# Patient Record
Sex: Female | Born: 1985 | Race: Black or African American | Hispanic: No | Smoking: Current every day smoker
Health system: Southern US, Community
[De-identification: ages and names within clinical notes are randomized; demographics above are authoritative.]

## PROBLEM LIST (undated history)

## (undated) ENCOUNTER — Inpatient Hospital Stay (HOSPITAL_COMMUNITY): Payer: Self-pay

## (undated) DIAGNOSIS — U071 COVID-19: Secondary | ICD-10-CM

## (undated) DIAGNOSIS — IMO0002 Reserved for concepts with insufficient information to code with codable children: Secondary | ICD-10-CM

## (undated) DIAGNOSIS — F419 Anxiety disorder, unspecified: Secondary | ICD-10-CM

## (undated) DIAGNOSIS — B977 Papillomavirus as the cause of diseases classified elsewhere: Secondary | ICD-10-CM

## (undated) DIAGNOSIS — R87629 Unspecified abnormal cytological findings in specimens from vagina: Secondary | ICD-10-CM

## (undated) DIAGNOSIS — A599 Trichomoniasis, unspecified: Secondary | ICD-10-CM

## (undated) DIAGNOSIS — R87619 Unspecified abnormal cytological findings in specimens from cervix uteri: Secondary | ICD-10-CM

## (undated) DIAGNOSIS — R51 Headache: Secondary | ICD-10-CM

## (undated) DIAGNOSIS — D649 Anemia, unspecified: Secondary | ICD-10-CM

## (undated) DIAGNOSIS — A749 Chlamydial infection, unspecified: Secondary | ICD-10-CM

## (undated) DIAGNOSIS — G971 Other reaction to spinal and lumbar puncture: Secondary | ICD-10-CM

## (undated) DIAGNOSIS — N390 Urinary tract infection, site not specified: Secondary | ICD-10-CM

## (undated) DIAGNOSIS — K219 Gastro-esophageal reflux disease without esophagitis: Secondary | ICD-10-CM

## (undated) HISTORY — PX: GYNECOLOGIC CRYOSURGERY: SHX857

## (undated) HISTORY — PX: DILATION AND CURETTAGE OF UTERUS: SHX78

## (undated) HISTORY — PX: PILONIDAL CYST EXCISION: SHX744

## (undated) HISTORY — PX: THERAPEUTIC ABORTION: SHX798

---
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
## 2005-02-08 ENCOUNTER — Inpatient Hospital Stay (HOSPITAL_COMMUNITY): Admission: AD | Admit: 2005-02-08 | Discharge: 2005-02-08 | Payer: Self-pay | Admitting: Obstetrics and Gynecology

## 2005-03-06 ENCOUNTER — Inpatient Hospital Stay (HOSPITAL_COMMUNITY): Admission: AD | Admit: 2005-03-06 | Discharge: 2005-03-06 | Payer: Self-pay | Admitting: *Deleted

## 2005-04-12 ENCOUNTER — Ambulatory Visit (HOSPITAL_COMMUNITY): Admission: RE | Admit: 2005-04-12 | Discharge: 2005-04-12 | Payer: Self-pay | Admitting: Obstetrics & Gynecology

## 2005-05-07 ENCOUNTER — Ambulatory Visit: Payer: Self-pay | Admitting: Gastroenterology

## 2005-07-08 ENCOUNTER — Emergency Department (HOSPITAL_COMMUNITY): Admission: EM | Admit: 2005-07-08 | Discharge: 2005-07-08 | Payer: Self-pay | Admitting: Emergency Medicine

## 2005-10-25 ENCOUNTER — Other Ambulatory Visit: Admission: RE | Admit: 2005-10-25 | Discharge: 2005-10-25 | Payer: Self-pay | Admitting: Obstetrics and Gynecology

## 2005-10-30 ENCOUNTER — Inpatient Hospital Stay (HOSPITAL_COMMUNITY): Admission: AD | Admit: 2005-10-30 | Discharge: 2005-10-30 | Payer: Self-pay | Admitting: Obstetrics and Gynecology

## 2006-01-28 ENCOUNTER — Inpatient Hospital Stay (HOSPITAL_COMMUNITY): Admission: AD | Admit: 2006-01-28 | Discharge: 2006-01-28 | Payer: Self-pay | Admitting: Obstetrics and Gynecology

## 2006-04-14 ENCOUNTER — Inpatient Hospital Stay (HOSPITAL_COMMUNITY): Admission: AD | Admit: 2006-04-14 | Discharge: 2006-04-14 | Payer: Self-pay | Admitting: Obstetrics and Gynecology

## 2006-05-02 ENCOUNTER — Inpatient Hospital Stay (HOSPITAL_COMMUNITY): Admission: AD | Admit: 2006-05-02 | Discharge: 2006-05-04 | Payer: Self-pay | Admitting: Obstetrics and Gynecology

## 2006-11-06 ENCOUNTER — Emergency Department (HOSPITAL_COMMUNITY): Admission: EM | Admit: 2006-11-06 | Discharge: 2006-11-06 | Payer: Self-pay | Admitting: Emergency Medicine

## 2007-03-30 ENCOUNTER — Emergency Department (HOSPITAL_COMMUNITY): Admission: EM | Admit: 2007-03-30 | Discharge: 2007-03-30 | Payer: Self-pay | Admitting: Emergency Medicine

## 2007-04-22 ENCOUNTER — Ambulatory Visit: Payer: Self-pay | Admitting: Gastroenterology

## 2007-04-22 LAB — CONVERTED CEMR LAB
ALT: 16 units/L (ref 0–35)
AST: 16 units/L (ref 0–37)
Albumin: 4 g/dL (ref 3.5–5.2)
Alkaline Phosphatase: 45 units/L (ref 39–117)
BUN: 7 mg/dL (ref 6–23)
Basophils Absolute: 0 10*3/uL (ref 0.0–0.1)
Chloride: 105 meq/L (ref 96–112)
Creatinine, Ser: 0.5 mg/dL (ref 0.4–1.2)
HCT: 34.1 % — ABNORMAL LOW (ref 36.0–46.0)
MCHC: 34.1 g/dL (ref 30.0–36.0)
Monocytes Relative: 7.2 % (ref 3.0–11.0)
RBC: 3.85 M/uL — ABNORMAL LOW (ref 3.87–5.11)
RDW: 11.8 % (ref 11.5–14.6)
Total Bilirubin: 0.5 mg/dL (ref 0.3–1.2)

## 2007-04-28 ENCOUNTER — Ambulatory Visit: Payer: Self-pay | Admitting: Gastroenterology

## 2007-07-01 ENCOUNTER — Inpatient Hospital Stay (HOSPITAL_COMMUNITY): Admission: AD | Admit: 2007-07-01 | Discharge: 2007-07-02 | Payer: Self-pay | Admitting: Gynecology

## 2007-07-09 DIAGNOSIS — R1013 Epigastric pain: Secondary | ICD-10-CM | POA: Insufficient documentation

## 2007-07-09 DIAGNOSIS — R109 Unspecified abdominal pain: Secondary | ICD-10-CM | POA: Insufficient documentation

## 2007-07-09 DIAGNOSIS — L0591 Pilonidal cyst without abscess: Secondary | ICD-10-CM | POA: Insufficient documentation

## 2007-07-09 DIAGNOSIS — K219 Gastro-esophageal reflux disease without esophagitis: Secondary | ICD-10-CM

## 2008-01-09 ENCOUNTER — Inpatient Hospital Stay (HOSPITAL_COMMUNITY): Admission: AD | Admit: 2008-01-09 | Discharge: 2008-01-09 | Payer: Self-pay | Admitting: Obstetrics

## 2008-02-02 ENCOUNTER — Ambulatory Visit: Payer: Self-pay | Admitting: Gastroenterology

## 2008-03-05 ENCOUNTER — Emergency Department (HOSPITAL_COMMUNITY): Admission: EM | Admit: 2008-03-05 | Discharge: 2008-03-05 | Payer: Self-pay | Admitting: Emergency Medicine

## 2008-09-23 ENCOUNTER — Emergency Department (HOSPITAL_COMMUNITY): Admission: EM | Admit: 2008-09-23 | Discharge: 2008-09-23 | Payer: Self-pay | Admitting: Emergency Medicine

## 2008-11-28 ENCOUNTER — Emergency Department (HOSPITAL_COMMUNITY): Admission: EM | Admit: 2008-11-28 | Discharge: 2008-11-28 | Payer: Self-pay | Admitting: Emergency Medicine

## 2009-01-19 ENCOUNTER — Ambulatory Visit (HOSPITAL_COMMUNITY): Admission: RE | Admit: 2009-01-19 | Discharge: 2009-01-19 | Payer: Self-pay | Admitting: Obstetrics

## 2009-01-28 ENCOUNTER — Inpatient Hospital Stay (HOSPITAL_COMMUNITY): Admission: AD | Admit: 2009-01-28 | Discharge: 2009-01-29 | Payer: Self-pay | Admitting: Obstetrics

## 2009-07-26 ENCOUNTER — Emergency Department (HOSPITAL_BASED_OUTPATIENT_CLINIC_OR_DEPARTMENT_OTHER): Admission: EM | Admit: 2009-07-26 | Discharge: 2009-07-26 | Payer: Self-pay | Admitting: Emergency Medicine

## 2009-09-19 ENCOUNTER — Emergency Department (HOSPITAL_BASED_OUTPATIENT_CLINIC_OR_DEPARTMENT_OTHER): Admission: EM | Admit: 2009-09-19 | Discharge: 2009-09-19 | Payer: Self-pay | Admitting: Emergency Medicine

## 2009-09-23 ENCOUNTER — Emergency Department (HOSPITAL_BASED_OUTPATIENT_CLINIC_OR_DEPARTMENT_OTHER): Admission: EM | Admit: 2009-09-23 | Discharge: 2009-09-23 | Payer: Self-pay | Admitting: Emergency Medicine

## 2010-01-10 ENCOUNTER — Emergency Department (HOSPITAL_BASED_OUTPATIENT_CLINIC_OR_DEPARTMENT_OTHER): Admission: EM | Admit: 2010-01-10 | Discharge: 2010-01-10 | Payer: Self-pay | Admitting: Emergency Medicine

## 2010-01-19 ENCOUNTER — Emergency Department (HOSPITAL_BASED_OUTPATIENT_CLINIC_OR_DEPARTMENT_OTHER): Admission: EM | Admit: 2010-01-19 | Discharge: 2010-01-19 | Payer: Self-pay | Admitting: Emergency Medicine

## 2010-01-25 ENCOUNTER — Emergency Department (HOSPITAL_BASED_OUTPATIENT_CLINIC_OR_DEPARTMENT_OTHER): Admission: EM | Admit: 2010-01-25 | Discharge: 2010-01-25 | Payer: Self-pay | Admitting: Emergency Medicine

## 2010-02-08 ENCOUNTER — Emergency Department (HOSPITAL_COMMUNITY): Admission: EM | Admit: 2010-02-08 | Discharge: 2010-02-08 | Payer: Self-pay | Admitting: Family Medicine

## 2010-05-31 ENCOUNTER — Emergency Department (HOSPITAL_BASED_OUTPATIENT_CLINIC_OR_DEPARTMENT_OTHER)
Admission: EM | Admit: 2010-05-31 | Discharge: 2010-05-31 | Payer: Self-pay | Source: Home / Self Care | Admitting: Emergency Medicine

## 2010-08-08 ENCOUNTER — Inpatient Hospital Stay (INDEPENDENT_AMBULATORY_CARE_PROVIDER_SITE_OTHER)
Admission: RE | Admit: 2010-08-08 | Discharge: 2010-08-08 | Disposition: A | Discharge status: Home / Self Care | Payer: Self-pay | Source: Ambulatory Visit | Attending: Family Medicine | Admitting: Family Medicine

## 2010-08-08 DIAGNOSIS — N760 Acute vaginitis: Secondary | ICD-10-CM

## 2010-08-08 LAB — WET PREP, GENITAL

## 2010-08-09 LAB — GC/CHLAMYDIA PROBE AMP, GENITAL: GC Probe Amp, Genital: NEGATIVE

## 2010-08-14 LAB — WET PREP, GENITAL: Yeast Wet Prep HPF POC: NONE SEEN

## 2010-08-14 LAB — URINALYSIS, ROUTINE W REFLEX MICROSCOPIC
Glucose, UA: NEGATIVE mg/dL
Ketones, ur: NEGATIVE mg/dL
Leukocytes, UA: NEGATIVE
Nitrite: NEGATIVE
Protein, ur: NEGATIVE mg/dL

## 2010-08-14 LAB — GC/CHLAMYDIA PROBE AMP, GENITAL: GC Probe Amp, Genital: NEGATIVE

## 2010-08-14 LAB — PREGNANCY, URINE: Preg Test, Ur: NEGATIVE

## 2010-08-17 LAB — POCT URINALYSIS DIPSTICK
Bilirubin Urine: NEGATIVE
Nitrite: NEGATIVE
Protein, ur: NEGATIVE mg/dL
Urobilinogen, UA: 0.2 mg/dL (ref 0.0–1.0)
pH: 5 (ref 5.0–8.0)

## 2010-08-17 LAB — URINE CULTURE: Culture  Setup Time: 201109072310

## 2010-08-17 LAB — WET PREP, GENITAL: Trich, Wet Prep: NONE SEEN

## 2010-08-17 LAB — GC/CHLAMYDIA PROBE AMP, GENITAL: Chlamydia, DNA Probe: NEGATIVE

## 2010-08-18 LAB — URINALYSIS, ROUTINE W REFLEX MICROSCOPIC
Bilirubin Urine: NEGATIVE
Bilirubin Urine: NEGATIVE
Glucose, UA: NEGATIVE mg/dL
Glucose, UA: NEGATIVE mg/dL
Glucose, UA: NEGATIVE mg/dL
Ketones, ur: NEGATIVE mg/dL
Ketones, ur: NEGATIVE mg/dL
Protein, ur: NEGATIVE mg/dL
Specific Gravity, Urine: 1.023 (ref 1.005–1.030)
Urobilinogen, UA: 0.2 mg/dL (ref 0.0–1.0)
pH: 5.5 (ref 5.0–8.0)
pH: 6.5 (ref 5.0–8.0)

## 2010-08-18 LAB — WET PREP, GENITAL
Clue Cells Wet Prep HPF POC: NONE SEEN
Trich, Wet Prep: NONE SEEN
WBC, Wet Prep HPF POC: NONE SEEN
Yeast Wet Prep HPF POC: NONE SEEN

## 2010-08-18 LAB — URINE MICROSCOPIC-ADD ON

## 2010-08-22 LAB — URINALYSIS, ROUTINE W REFLEX MICROSCOPIC
Nitrite: NEGATIVE
Specific Gravity, Urine: 1.023 (ref 1.005–1.030)
pH: 8 (ref 5.0–8.0)

## 2010-08-22 LAB — PREGNANCY, URINE: Preg Test, Ur: NEGATIVE

## 2010-08-22 LAB — WET PREP, GENITAL

## 2010-08-25 LAB — WET PREP, GENITAL
Trich, Wet Prep: NONE SEEN
Yeast Wet Prep HPF POC: NONE SEEN

## 2010-08-25 LAB — URINE MICROSCOPIC-ADD ON

## 2010-08-25 LAB — GLUCOSE, CAPILLARY: Glucose-Capillary: 118 mg/dL — ABNORMAL HIGH (ref 70–99)

## 2010-08-25 LAB — PREGNANCY, URINE: Preg Test, Ur: NEGATIVE

## 2010-08-25 LAB — URINALYSIS, ROUTINE W REFLEX MICROSCOPIC
Nitrite: NEGATIVE
Specific Gravity, Urine: 1.025 (ref 1.005–1.030)
Urobilinogen, UA: 0.2 mg/dL (ref 0.0–1.0)

## 2010-08-25 LAB — GC/CHLAMYDIA PROBE AMP, GENITAL: GC Probe Amp, Genital: NEGATIVE

## 2010-09-09 LAB — URINALYSIS, ROUTINE W REFLEX MICROSCOPIC
Bilirubin Urine: NEGATIVE
Glucose, UA: NEGATIVE mg/dL
Specific Gravity, Urine: 1.015 (ref 1.005–1.030)
Urobilinogen, UA: 0.2 mg/dL (ref 0.0–1.0)

## 2010-09-09 LAB — URINE MICROSCOPIC-ADD ON

## 2010-10-02 ENCOUNTER — Emergency Department (HOSPITAL_BASED_OUTPATIENT_CLINIC_OR_DEPARTMENT_OTHER)
Admission: EM | Admit: 2010-10-02 | Discharge: 2010-10-02 | Disposition: A | Discharge status: Home / Self Care | Payer: Self-pay | Attending: Emergency Medicine | Admitting: Emergency Medicine

## 2010-10-02 DIAGNOSIS — N760 Acute vaginitis: Secondary | ICD-10-CM | POA: Insufficient documentation

## 2010-10-02 DIAGNOSIS — A499 Bacterial infection, unspecified: Secondary | ICD-10-CM | POA: Insufficient documentation

## 2010-10-02 DIAGNOSIS — B9689 Other specified bacterial agents as the cause of diseases classified elsewhere: Secondary | ICD-10-CM | POA: Insufficient documentation

## 2010-10-02 LAB — WET PREP, GENITAL: Yeast Wet Prep HPF POC: NONE SEEN

## 2010-10-02 LAB — PREGNANCY, URINE: Preg Test, Ur: NEGATIVE

## 2010-10-17 NOTE — Assessment & Plan Note (Signed)
[facility]                         GASTROENTEROLOGY OFFICE NOTE   Ashley Cole, Ashley Cole                      MRN:          [id number]  DATE:04/22/2007                            DOB:          [birth year]    PRIMARY CARE PHYSICIAN:  Dr. Bing Neighbors. Clearance Coots.   GI PROBLEM LIST:  1. Likely gastroesophageal reflux disease, related to dyspepsia and      nausea, which is improved with PPI two years ago. Pyrosis, chest      pains.   INTERVAL HISTORY:  I last saw Ashley Cole two years ago.  She did not show up  for follow-up appointments, to re-evaluate her nausea and her  gastroesophageal reflux disease-like symptoms.  Since then, she gave  birth to her son, who will be turning one this month.  She has  intermittent pyrosis, chest pains, nausea and vomiting.  She used to  drink a lot of caffeine, but has cut back on this.  She noticed that  alcohol intake will worsen these symptoms.  She drinks probably twice a  week quite high quantities for her size when she does drink.  She smokes  cigarettes as well.  She has been to the emergency room in the past  month or two, when the burning in her chest got significant.  She was  told that this was gastroesophageal reflux disease-related and was given  what sounds like Prilosec.  She took that for three or four days and  noticed some improvement, but has stopped.   REVIEW OF SYSTEMS:  Notable for a stable weight.   CURRENT MEDICATIONS:  Loestrin.   PHYSICAL EXAMINATION:  VITAL SIGNS:  Weight 113 pounds, which is down 4  pounds in two years, blood pressure 110/54, pulse 72.  CONSTITUTIONAL:  Generally well-appearing.  LUNGS:  Clear to auscultation bilaterally.  HEART:  A regular rate and rhythm.  ABDOMEN:  Soft, nontender, non-distended.  Normal bowel sounds.   ASSESSMENT/PLAN:  A 25 year old woman with probable gastroesophageal  reflux disease-related nausea and vomiting.   I will get a basic set of labs today,  including a CBC and a complete  metabolic profile.  I have given her samples for a proton pump inhibitor  and instructing her to take it 20-30 minutes prior to her breakfast meal  on a daily basis.  I am giving her a gastroesophageal reflux disease  handout and have explicitly states that caffeine intake, smoking  cigarettes and alcohol intake  can create worse gastroesophageal reflux disease problems.  I think we  should proceed with an EGD at her soonest convenience.     Rachael Fee, MD  Electronically Signed    DPJ/MedQ  DD: 04/22/2007  DT: 04/22/2007  Job #: [id number]   cc:   Leonette Most A. Clearance Coots, M.D.

## 2010-10-20 NOTE — Discharge Summary (Signed)
NAMEJANARIA, Ashley Cole               ACCOUNT NO.:  [id number]   MEDICAL RECORD NO.:  [id number]          PATIENT TYPE:  INP   LOCATION:  9108                          FACILITY:  [facility]   PHYSICIAN:  Zenaida Niece, M.D.DATE OF BIRTH:  01-15-1986   DATE OF ADMISSION:  05/02/2006  DATE OF DISCHARGE:  05/04/2006                               DISCHARGE SUMMARY   ADMISSION DIAGNOSIS:  Intrauterine pregnancy at 38 weeks.   DISCHARGE DIAGNOSES:  1. Intrauterine pregnancy at 38 weeks.  2. Spinal headache.   PROCEDURES:  On May 02, 2006, she had a spontaneous vaginal  delivery, and on December 1, she had a lumbar epidural blood patch.   HISTORY AND PHYSICAL:  This is a 25 year old black female, gravida 6,  para 1-0-4-1, with an EGA of 38+ weeks who is admitted by Dr. Ambrose Mantle for  elective induction.  Prenatal care complicated by a UTI treated with  Macrobid and colposcopy consistent with a low-grade SIL Pap smear.   PRENATAL LABORATORY DATA:  Blood type is A+ with negative antibody  screens. Hemoglobin electrophoresis is normal, RPR nonreactive, rubella  immune, hepatitis B surface antigen negative, HIV negative, gonorrhea  and chlamydia negative. First trimester screen normal.  MSAFP is normal,  and group B strep is negative.   PAST OB HISTORY:  Four elective terminations.   PAST SURGICAL HISTORY:  Pilonidal cyst removal.   PAST MEDICAL HISTORY:  Gastroesophageal reflux disease and attention  deficit disorder.   PHYSICAL EXAMINATION:  VITAL SIGNS:  She is afebrile with stable vital  signs.  ABDOMEN: Soft with fundal height of 36.5 cm.  Fetal heart tracing is  normal.  PELVIC:  Cervix is 2, 60, -2, and vertex.   HOSPITAL COURSE:  The patient was admitted and started on Pitocin.  On  the afternoon of November 29, Dr. Ambrose Mantle was able to perform amniotomy.  She then progressed to complete, pushed well, and on the evening of  November 29 had a vaginal delivery of a viable female  infant with Apgars  of 8 and 9, weight 6 pounds.  Placenta was intact.  Uterus was normal,  and she had no perineal lacerations.  She had bilateral labial  lacerations which were hemostatic and not repaired.  Estimated blood  loss was 500 mL.   Postpartum course was complicated only by a post epidural headache for  which she eventually received a blood patch by Dr. Malen Gauze, and this  greatly improved her headaches.  Pre delivery hemoglobin 10.9,  postdelivery 10.3.  On postpartum #2, she was felt to be stable enough  for discharge home.   DISCHARGE INSTRUCTIONS:  Regular diet, pelvic rest,  follow up in 6  weeks.   DISCHARGE MEDICATIONS:  1. Over-the-counter ibuprofen as needed.  2. Percocet, #20, one to two p.o. q. 4-6 h p.r.n. pain.   She is given our discharge pamphlet.      Zenaida Niece, M.D.  Electronically Signed     TDM/MEDQ  D:  05/04/2006  T:  05/06/2006  Job:  [id number]

## 2010-10-20 NOTE — Discharge Summary (Signed)
[facility]  Patient:    Ashley Cole, Ashley Cole                      MRN: [id number] Adm. Date:  [id number] Disc. Date: [id number] Attending:  Michaele Offer                           Discharge Summary  HISTORY:                      This is a 25 year old black female para 0-0-1-0, gravida 2, last period March 28, 2000, [facility] January 02, 2001.  Initial ultrasound on July 15, 2000 showed her to be 15 weeks and 4 days with an [place] of January 02, 2001.  The patient felt that she had been leaking fluid for two to three days.  Her contractions were irregular.  She had no vaginal bleeding.  She had good fetal movement.  Examination in the office by Dr. Jackelyn Knife showed a positive Nitrazine.  AFI was 4.4 which had gone down from 9+ within a week.  The patients prenatal course was complicated by anemia treated with iron.  She was taking Chromagen.  PAST OBSTETRICAL HISTORY:     She had an early abortion in 2001.  ALLERGIES:                    No known drug allergies.  PAST MEDICAL HISTORY:         History of anemia, history of ADHD, pilonidal cyst.  FAMILY HISTORY:               She has a brother with CP.  Father of the baby has a hole in his heart.  SOCIAL HISTORY:               Patient is single.  No tobacco.  PRENATAL LABORATORIES:        Blood group and type A+ with a negative antibody screen.  Rubella immune.  Hepatitis B surface antigen negative.  HIV negative. Serology nonreactive.  GC and chlamydia negative.  Group B strep negative. One hour glucola 80.                                On admission the patient was placed on Pitocin. By 1:40 a.m. on November 25, 2000 the cervix was 2 cm, 80%, vertex at a -1 and artificial rupture of the membranes of the possible four bag produced clear fluid.  The patient progressed to full dilatation and with a silver dollar of caput showing began having significant decelerations that persisted in spite of lateral  uterine displacement and oxygen.  Mityvac vacuum was placed and the vertex brought out ROA by Dr. Ambrose Mantle with one contraction.  Infant was female, 6 pounds 7 ounces, Apgars 9 at one and 9 at five minutes.  Placenta intact. Bilateral labial lacerations hemostatic and not repaired.  Blood loss about 300 cc.  Postpartum the patient did well.  She was seen by the social work team.  I filled out forms for Homebound and she was ready for discharge on the second postpartum day.  LABORATORIES:                 White count on admission was 8000, hemoglobin 11.3, hematocrit 32.3, platelet count 252,000.  Followup white count 10,100, hemoglobin 10.6,  hematocrit 30.6, platelet count 205,000.  RPR was nonreactive.  FINAL DIAGNOSES:              Intrauterine pregnancy at 38 weeks delivered right occiput anterior, premature rupture of membranes.  OPERATION:                    Vacuum assisted delivery right occiput anterior.  FINAL CONDITION:              Improved.  DISCHARGE INSTRUCTIONS:       Regular discharge instruction booklet.  Patient is advised to return to the office in six weeks for followup examination.  She declines analgesics at discharge.  She is advised to call with any problems. DD:  12/27/00 TD:  12/27/00 Job: [id number]

## 2010-10-29 ENCOUNTER — Emergency Department (HOSPITAL_COMMUNITY): Payer: Self-pay

## 2010-10-29 ENCOUNTER — Emergency Department (HOSPITAL_COMMUNITY)
Admission: EM | Admit: 2010-10-29 | Discharge: 2010-10-29 | Disposition: A | Discharge status: Home / Self Care | Payer: Self-pay | Attending: Emergency Medicine | Admitting: Emergency Medicine

## 2010-10-29 DIAGNOSIS — S91009A Unspecified open wound, unspecified ankle, initial encounter: Secondary | ICD-10-CM | POA: Insufficient documentation

## 2010-10-29 DIAGNOSIS — M79609 Pain in unspecified limb: Secondary | ICD-10-CM | POA: Insufficient documentation

## 2010-10-29 DIAGNOSIS — S81009A Unspecified open wound, unspecified knee, initial encounter: Secondary | ICD-10-CM | POA: Insufficient documentation

## 2011-01-03 ENCOUNTER — Inpatient Hospital Stay (HOSPITAL_COMMUNITY)
Admission: AD | Admit: 2011-01-03 | Discharge: 2011-01-03 | Disposition: A | Discharge status: Home / Self Care | Payer: Self-pay | Source: Ambulatory Visit | Attending: Obstetrics & Gynecology | Admitting: Obstetrics & Gynecology

## 2011-01-03 ENCOUNTER — Encounter (HOSPITAL_COMMUNITY): Payer: Self-pay

## 2011-01-03 ENCOUNTER — Inpatient Hospital Stay (HOSPITAL_COMMUNITY): Payer: Self-pay

## 2011-01-03 DIAGNOSIS — R58 Hemorrhage, not elsewhere classified: Secondary | ICD-10-CM

## 2011-01-03 DIAGNOSIS — O209 Hemorrhage in early pregnancy, unspecified: Secondary | ICD-10-CM | POA: Insufficient documentation

## 2011-01-03 HISTORY — DX: Anemia, unspecified: D64.9

## 2011-01-03 LAB — URINALYSIS, ROUTINE W REFLEX MICROSCOPIC
Glucose, UA: NEGATIVE mg/dL
Leukocytes, UA: NEGATIVE
Protein, ur: NEGATIVE mg/dL
Specific Gravity, Urine: >1.03 — ABNORMAL HIGH (ref 1.005–1.030)
pH: 6 (ref 5.0–8.0)

## 2011-01-03 LAB — ABO/RH: ABO/RH(D): A POS

## 2011-01-03 LAB — URINE MICROSCOPIC-ADD ON

## 2011-01-03 LAB — CBC
MCHC: 32.9 g/dL (ref 30.0–36.0)
RDW: 12.6 % (ref 11.5–15.5)

## 2011-01-03 LAB — WET PREP, GENITAL: Trich, Wet Prep: NONE SEEN

## 2011-01-03 LAB — HCG, QUANTITATIVE, PREGNANCY: hCG, Beta Chain, Quant, S: 28812 m[IU]/mL — ABNORMAL HIGH (ref <5)

## 2011-01-03 LAB — POCT PREGNANCY, URINE: Preg Test, Ur: POSITIVE

## 2011-01-03 MED ORDER — ONDANSETRON 8 MG PO TBDP: 8.0000 mg | ORAL_TABLET | Freq: Three times a day (TID) | ORAL | Status: AC | PRN

## 2011-01-03 MED ORDER — ONDANSETRON 8 MG PO TBDP
8.0000 mg | ORAL_TABLET | Freq: Once | ORAL | Status: AC
  Administered 2011-01-03: 8 mg via ORAL

## 2011-01-03 NOTE — Progress Notes (Signed)
Pt states awoke at 0800, was bleeding, no clots noted, took bath, bleeding has slowed down now. Mild cramping, feels nauseated. Very small smear on pad at present.

## 2011-01-03 NOTE — ED Provider Notes (Signed)
History     Chief Complaint  Patient presents with  . Vaginal Bleeding   HPI LMP 11-22-10  Positive pregnancy test today.  Has not started prenatal care.  Awakened with vaginal bleeding this morning.  Came for evaluation.  OB History    Grav Para Term Preterm Abortions TAB SAB Ect Mult Living   7 2 2  4 4    2       Past Medical History  Diagnosis Date  . Anemia     Past Surgical History  Procedure Date  . Cystectomy   . Dilation and curettage of uterus     No family history on file.  History  Substance Use Topics  . Smoking status: Current Everyday Smoker -- 0.0 packs/day  . Smokeless tobacco: Not on file  . Alcohol Use: No    Allergies: No Known Allergies  Prescriptions prior to admission  Medication Sig Dispense Refill  . calcium carbonate (TUMS - DOSED IN MG ELEMENTAL CALCIUM) 500 MG chewable tablet Chew 1 tablet by mouth daily as needed. For heartburn       . fluconazole (DIFLUCAN) 150 MG tablet Take 150 mg by mouth once.        Marland Kitchen OVER THE COUNTER MEDICATION 1 applicator as needed. For itching - patient used Monostat for yeast infection         Review of Systems  Gastrointestinal: Positive for nausea. Negative for vomiting and abdominal pain.  Genitourinary: Negative for dysuria.       Vaginal bleeding   Physical Exam   Blood pressure 113/41, pulse 72, temperature 98.4 F (36.9 C), resp. rate 16, height 4' 11.5" (1.511 m), weight 124 lb 9.6 oz (56.518 kg), last menstrual period 11/26/2010, SpO2 99.00%.  Physical Exam  Nursing note and vitals reviewed. Constitutional: She is oriented to person, place, and time. She appears well-developed and well-nourished.  HENT:  Head: Normocephalic.  Eyes: EOM are normal.  Neck: Neck supple.  GI: Soft. There is no tenderness. There is no rebound and no guarding.  Genitourinary:       Speculum exam:  Moderate amount of old blood in vagina Bimanual exam:  Cervix closed.  Uterus retroverted and difficult to size    Musculoskeletal: Normal range of motion.  Neurological: She is alert and oriented to person, place, and time.  Skin: Skin is warm and dry.  Psychiatric: She has a normal mood and affect.    MAU Course  Procedures Results for orders placed during the hospital encounter of 01/03/11 (from the past 24 hour(s))  URINALYSIS, ROUTINE W REFLEX MICROSCOPIC     Status: Abnormal   Collection Time   01/03/11  9:50 AM      Component Value Range   Color, Urine YELLOW  YELLOW    Appearance CLEAR  CLEAR    Specific Gravity, Urine >1.030 (*) 1.005 - 1.030    pH 6.0  5.0 - 8.0    Glucose, UA NEGATIVE  NEGATIVE (mg/dL)   Hgb urine dipstick LARGE (*) NEGATIVE    Bilirubin Urine NEGATIVE  NEGATIVE    Ketones, ur NEGATIVE  NEGATIVE (mg/dL)   Protein, ur NEGATIVE  NEGATIVE (mg/dL)   Urobilinogen, UA 0.2  0.0 - 1.0 (mg/dL)   Nitrite NEGATIVE  NEGATIVE    Leukocytes, UA NEGATIVE  NEGATIVE   URINE MICROSCOPIC-ADD ON     Status: Abnormal   Collection Time   01/03/11  9:50 AM      Component Value Range   Squamous  Epithelial / LPF FEW (*) RARE    RBC / HPF 3-6  <3 (RBC/hpf)   Urine-Other MUCOUS PRESENT    POCT PREGNANCY, URINE     Status: Normal   Collection Time   01/03/11  9:59 AM      Component Value Range   Preg Test, Ur POSITIVE    CBC     Status: Abnormal   Collection Time   01/03/11 10:40 AM      Component Value Range   WBC 4.6  4.0 - 10.5 (K/uL)   RBC 3.63 (*) 3.87 - 5.11 (MIL/uL)   Hemoglobin 10.6 (*) 12.0 - 15.0 (g/dL)   HCT 91.4 (*) 78.2 - 46.0 (%)   MCV 88.7  78.0 - 100.0 (fL)   MCH 29.2  26.0 - 34.0 (pg)   MCHC 32.9  30.0 - 36.0 (g/dL)   RDW 95.6  21.3 - 08.6 (%)   Platelets 239  150 - 400 (K/uL)  HCG, QUANTITATIVE, PREGNANCY     Status: Abnormal   Collection Time   01/03/11 10:40 AM      Component Value Range   hCG, Beta Chain, Quant, S 57846 (*) <5 (mIU/mL)  ABO/RH     Status: Normal   Collection Time   01/03/11 10:40 AM      Component Value Range   ABO/RH(D) A POS    WET PREP,  GENITAL     Status: Abnormal   Collection Time   01/03/11 10:40 AM      Component Value Range   Yeast, Wet Prep NONE SEEN  NONE SEEN    Trich, Wet Prep NONE SEEN  NONE SEEN    Clue Cells, Wet Prep FEW (*) NONE SEEN    WBC, Wet Prep HPF POC FEW (*) NONE SEEN     MDM Ultrasound:  IUGS, flattened and irreg in shape with small subchorionic bleed noted.  Yolk sac seen.  No fetal pole.  Assessment and Plan  Bleeding in pregnancy Nausea  Plan: Will repeat ultrasound in one week on Aug 8 at 9 am since today's [place] does not show viable pregnancy. Will prescribe zofran for client.   Takyla Kuchera 01/03/2011, 10:42 AM   Nolene Bernheim, NP 01/03/11 1402

## 2011-01-03 NOTE — Progress Notes (Signed)
Pt states she did a HPT that was POS about one week ago. Has been nauseated, no vomiting. This am woke up with a lot of blood in panties this am. Not wearing a pad at this time and no bleeding noted on tissue. Given a pad.

## 2011-01-03 NOTE — ED Provider Notes (Signed)
Agree with above note.  Ashley Cole A 01/03/2011 2:32 PM

## 2011-01-10 ENCOUNTER — Inpatient Hospital Stay (HOSPITAL_COMMUNITY)
Admission: AD | Admit: 2011-01-10 | Discharge: 2011-01-10 | Disposition: A | Discharge status: Home / Self Care | Payer: Self-pay | Source: Ambulatory Visit | Attending: Obstetrics & Gynecology | Admitting: Obstetrics & Gynecology

## 2011-01-10 ENCOUNTER — Ambulatory Visit (HOSPITAL_COMMUNITY)
Admission: RE | Admit: 2011-01-10 | Discharge: 2011-01-10 | Disposition: A | Discharge status: Home / Self Care | Payer: Self-pay | Source: Ambulatory Visit | Attending: Obstetrics & Gynecology | Admitting: Obstetrics & Gynecology

## 2011-01-10 DIAGNOSIS — Z3689 Encounter for other specified antenatal screening: Secondary | ICD-10-CM | POA: Insufficient documentation

## 2011-01-10 DIAGNOSIS — O210 Mild hyperemesis gravidarum: Secondary | ICD-10-CM | POA: Insufficient documentation

## 2011-01-10 DIAGNOSIS — O209 Hemorrhage in early pregnancy, unspecified: Secondary | ICD-10-CM

## 2011-01-10 MED ORDER — PROMETHAZINE HCL 25 MG PO TABS: 25.0000 mg | ORAL_TABLET | Freq: Four times a day (QID) | ORAL | Status: DC | PRN

## 2011-01-10 NOTE — ED Provider Notes (Addendum)
History   Pt presents today for viability [place]. She states she is doing well and is no longer having any vag bleeding.   Chief Complaint  Patient presents with  . Morning Sickness   HPI  OB History    Grav Para Term Preterm Abortions TAB SAB Ect Mult Living   7 2 2  4 4    2       Past Medical History  Diagnosis Date  . Anemia     Past Surgical History  Procedure Date  . Cystectomy   . Dilation and curettage of uterus     No family history on file.  History  Substance Use Topics  . Smoking status: Current Everyday Smoker -- 0.0 packs/day  . Smokeless tobacco: Not on file  . Alcohol Use: No    Allergies: No Known Allergies  Prescriptions prior to admission  Medication Sig Dispense Refill  . calcium carbonate (TUMS - DOSED IN MG ELEMENTAL CALCIUM) 500 MG chewable tablet Chew 1 tablet by mouth daily as needed. For heartburn       . fluconazole (DIFLUCAN) 150 MG tablet Take 150 mg by mouth once.        . ondansetron (ZOFRAN ODT) 8 MG disintegrating tablet Take 1 tablet (8 mg total) by mouth every 8 (eight) hours as needed for nausea (place under your tongue for the tablet to dissolve).  20 tablet  0  . OVER THE COUNTER MEDICATION 1 applicator as needed. For itching - patient used Monostat for yeast infection         Review of Systems  Constitutional: Negative for fever.  Cardiovascular: Negative for chest pain.  Gastrointestinal: Positive for nausea. Negative for vomiting, abdominal pain, diarrhea and constipation.  Genitourinary: Negative for dysuria, urgency, frequency, hematuria and flank pain.  Neurological: Negative for dizziness and headaches.  Psychiatric/Behavioral: Negative for depression and suicidal ideas.   Physical Exam   Blood pressure 99/53, pulse 79, temperature 98.4 F (36.9 C), temperature source Oral, resp. rate 16, last menstrual period 11/26/2010.  Physical Exam  Constitutional: She is oriented to person, place, and time. She appears  well-developed and well-nourished. No distress.  HENT:  Head: Normocephalic and atraumatic.  GI: Soft. She exhibits no distension. There is no tenderness. There is no rebound and no guarding.  Neurological: She is alert and oriented to person, place, and time.  Skin: Skin is warm and dry. She is not diaphoretic.  Psychiatric: She has a normal mood and affect. Her behavior is normal. Judgment and thought content normal.    MAU Course  Procedures  US shows single IUP with Gest age of 29wks 0days, and EDD of 08/29/2011.  Assessment and Plan  Pregnancy: pt is to begin prenatal care. Will give Rx for phenergan. Discussed diet, activity, risks, and precautions.  Clinton Gallant. Jhene Westmoreland III, DrHSc, MPAS, PA-C  01/10/2011, 9:29 AM   Henrietta Hoover, PA 01/10/11 [phone]

## 2011-01-10 NOTE — Progress Notes (Signed)
Patient here for repeat US, having nausea, meds not working for nausea, sharp pains from time to time, none at present, bleeding stopped on Saturday

## 2011-01-28 ENCOUNTER — Encounter (HOSPITAL_BASED_OUTPATIENT_CLINIC_OR_DEPARTMENT_OTHER): Payer: Self-pay | Admitting: *Deleted

## 2011-01-28 ENCOUNTER — Emergency Department (HOSPITAL_BASED_OUTPATIENT_CLINIC_OR_DEPARTMENT_OTHER)
Admission: EM | Admit: 2011-01-28 | Discharge: 2011-01-28 | Disposition: A | Discharge status: Home / Self Care | Payer: Self-pay | Attending: Emergency Medicine | Admitting: Emergency Medicine

## 2011-01-28 DIAGNOSIS — H9209 Otalgia, unspecified ear: Secondary | ICD-10-CM | POA: Insufficient documentation

## 2011-01-28 DIAGNOSIS — H6690 Otitis media, unspecified, unspecified ear: Secondary | ICD-10-CM | POA: Insufficient documentation

## 2011-01-28 LAB — URINALYSIS, ROUTINE W REFLEX MICROSCOPIC
Glucose, UA: NEGATIVE mg/dL
Leukocytes, UA: NEGATIVE
Nitrite: NEGATIVE
Protein, ur: NEGATIVE mg/dL

## 2011-01-28 LAB — PREGNANCY, URINE: Preg Test, Ur: POSITIVE

## 2011-01-28 LAB — URINE MICROSCOPIC-ADD ON

## 2011-01-28 MED ORDER — HYDROCODONE-ACETAMINOPHEN 5-325 MG PO TABS: 2.0000 | ORAL_TABLET | ORAL | Status: AC | PRN

## 2011-01-28 MED ORDER — AMOXICILLIN 500 MG PO CAPS: 500.0000 mg | ORAL_CAPSULE | Freq: Three times a day (TID) | ORAL | Status: AC

## 2011-01-28 NOTE — ED Notes (Signed)
Family with pt at discharge.  Pt educated about prescriptions and following up with PCP.  Pt educated about following up with clinic regarding post abortion concerns. Pt verbalizes understanding of care plan.  No acute distress noted at this time.

## 2011-01-28 NOTE — ED Provider Notes (Signed)
History     CSN: [id number] Arrival date & time: 01/28/2011  3:36 PM  Chief Complaint  Patient presents with  . Otalgia   Patient is a 25 y.o. female presenting with ear pain. The history is provided by the patient. No language interpreter was used.  Otalgia This is a new problem. The current episode started more than 2 days ago. There is pain in the left ear. The problem occurs constantly. The problem has been gradually worsening. There has been no fever. The fever has been present for less than 1 day. The pain is at a severity of 5/10. The pain is moderate. Associated symptoms include sore throat. Pertinent negatives include no headaches, no hearing loss, no rhinorrhea, no abdominal pain, no diarrhea, no vomiting, no cough and no rash. Her past medical history does not include chronic ear infection or hearing loss.  Pt also had positive home pregnancy test.  Pt had an abortion 2 weeks ago.  She has had unprotected sex since.  Pt wants to know if test is positive from old pregnancy or if this is a new pregnancy.  No abdominal pain.  No vaginal bleeding  Past Medical History  Diagnosis Date  . Anemia     Past Surgical History  Procedure Date  . Cystectomy   . Dilation and curettage of uterus   . Therapeutic abortion     History reviewed. No pertinent family history.  History  Substance Use Topics  . Smoking status: Current Everyday Smoker -- 0.0 packs/day  . Smokeless tobacco: Not on file  . Alcohol Use: No    OB History    Grav Para Term Preterm Abortions TAB SAB Ect Mult Living   7 2 2  4 4    2       Review of Systems  HENT: Positive for ear pain and sore throat. Negative for hearing loss and rhinorrhea.   Respiratory: Negative for cough.   Gastrointestinal: Negative for vomiting, abdominal pain and diarrhea.  Skin: Negative for rash.  Neurological: Negative for headaches.  All other systems reviewed and are negative.    Physical Exam  BP 112/72  Pulse 92   Temp(Src) 98.6 F (37 C) (Oral)  Resp 18  Ht 5' (1.524 m)  Wt 125 lb (56.7 kg)  BMI 24.41 kg/m2  SpO2 100%  LMP 11/26/2010  Breastfeeding? Unknown  Physical Exam  Nursing note and vitals reviewed. Constitutional: She is oriented to person, place, and time. She appears well-developed and well-nourished.  HENT:  Head: Normocephalic and atraumatic.  Left Ear: Tympanic membrane is erythematous and bulging.  Eyes: Conjunctivae and EOM are normal. Pupils are equal, round, and reactive to light.  Neck: Normal range of motion. Neck supple.  Cardiovascular: Normal rate, regular rhythm and normal heart sounds.   Pulmonary/Chest: Effort normal.  Abdominal: Soft. There is no tenderness.  Musculoskeletal: Normal range of motion.  Neurological: She is alert and oriented to person, place, and time. She has normal reflexes.  Skin: Skin is warm.  Psychiatric: She has a normal mood and affect.    ED Course  Procedures  MDM I advised pt she needs to recheck at abortion clinic.  She is advised to go to women's if complications.  Medical screening examination/treatment/procedure(s) were performed by non-physician practitioner and as supervising physician I was immediately available for consultation/collaboration. Osvaldo Human, M.D.    [place], [place] 01/28/11 1716  Carleene Cooper III, MD 01/29/11 [phone]

## 2011-01-28 NOTE — ED Notes (Signed)
Pt states she has had left ear pain, sore throat and gums swollen for about a week. Sharp pains in head. Also states that she had an abortion on the 9th and is concerned that she is still testing positive.

## 2011-01-28 NOTE — ED Notes (Signed)
Pt reports ear pain with drainage

## 2011-02-10 ENCOUNTER — Inpatient Hospital Stay (HOSPITAL_COMMUNITY)
Admission: AD | Admit: 2011-02-10 | Discharge: 2011-02-10 | Disposition: A | Discharge status: Home / Self Care | Payer: Self-pay | Source: Ambulatory Visit | Attending: Obstetrics & Gynecology | Admitting: Obstetrics & Gynecology

## 2011-02-10 ENCOUNTER — Inpatient Hospital Stay (HOSPITAL_COMMUNITY): Payer: Self-pay

## 2011-02-10 ENCOUNTER — Encounter (HOSPITAL_COMMUNITY): Payer: Self-pay | Admitting: Obstetrics and Gynecology

## 2011-02-10 DIAGNOSIS — N898 Other specified noninflammatory disorders of vagina: Secondary | ICD-10-CM | POA: Insufficient documentation

## 2011-02-10 DIAGNOSIS — N939 Abnormal uterine and vaginal bleeding, unspecified: Secondary | ICD-10-CM

## 2011-02-10 LAB — DIFFERENTIAL
Eosinophils Absolute: 0.2 10*3/uL (ref 0.0–0.7)
Eosinophils Relative: 3 % (ref 0–5)
Lymphocytes Relative: 37 % (ref 12–46)
Lymphs Abs: 1.8 10*3/uL (ref 0.7–4.0)
Monocytes Relative: 10 % (ref 3–12)
Neutrophils Relative %: 49 % (ref 43–77)

## 2011-02-10 LAB — CBC
Hemoglobin: 10.3 g/dL — ABNORMAL LOW (ref 12.0–15.0)
MCH: 29.6 pg (ref 26.0–34.0)
MCV: 89.9 fL (ref 78.0–100.0)
Platelets: 249 10*3/uL (ref 150–400)
RBC: 3.48 MIL/uL — ABNORMAL LOW (ref 3.87–5.11)
WBC: 4.8 10*3/uL (ref 4.0–10.5)

## 2011-02-10 LAB — HEMOGLOBIN AND HEMATOCRIT, BLOOD
HCT: 31.5 % — ABNORMAL LOW (ref 36.0–46.0)
Hemoglobin: 10.5 g/dL — ABNORMAL LOW (ref 12.0–15.0)

## 2011-02-10 LAB — WET PREP, GENITAL: Yeast Wet Prep HPF POC: NONE SEEN

## 2011-02-10 NOTE — Progress Notes (Signed)
Pt presents to MAU with complaints of ? Pregnancy and vaginal bleeding. Pt had an abortion on Aug 9th. At her 4 week checkup her pregnancy test was positive. Pt was at home and felt a gush of blood, with large clotts. Pt is a Barista

## 2011-02-10 NOTE — Progress Notes (Signed)
Patient reports big gush of blood at home has changed 3 pads in past hour, patient was pregnant had abortion at 7 weeks on August 9th, went back Thursday tested positive, patient unsure if new pregnancy.

## 2011-02-10 NOTE — ED Provider Notes (Signed)
History     CSN: [id number] Arrival date & time: 02/10/2011  2:15 PM  Chief Complaint  Patient presents with  . Vaginal Bleeding   HPI Ashley Cole is a 25 y.o. female who presents to MAU for follow up s/p TAB 01/11/11 at [facility]. Today had a gush of blood and camping. Used 3 pads in one hour. Had positive pregnancy test last week and was unsure if this was a new pregnancy or left from TAB.  Past Medical History  Diagnosis Date  . Anemia     Past Surgical History  Procedure Date  . Cystectomy   . Dilation and curettage of uterus   . Therapeutic abortion     No family history on file.  History  Substance Use Topics  . Smoking status: Current Everyday Smoker -- 0.0 packs/day  . Smokeless tobacco: Not on file  . Alcohol Use: No    OB History    Grav Para Term Preterm Abortions TAB SAB Ect Mult Living   7 2 2  4 4    2       Review of Systems  Gastrointestinal: Positive for abdominal pain.  Genitourinary: Positive for vaginal bleeding.  Musculoskeletal: Positive for back pain.  Skin: Positive for pallor.  Neurological: Positive for light-headedness.  All other systems reviewed and are negative.    Physical Exam  BP 108/62  Pulse 88  Temp(Src) 98.8 F (37.1 C) (Oral)  Resp 18  Ht 5' (1.524 m)  Wt 124 lb (56.246 kg)  BMI 24.22 kg/m2  LMP 11/26/2010  Breastfeeding? Unknown  Physical Exam  Nursing note and vitals reviewed. Constitutional: She is oriented to person, place, and time. She appears well-developed and well-nourished.  HENT:  Head: Normocephalic.  Eyes: EOM are normal.  Neck: Neck supple.  Pulmonary/Chest: Effort normal.  Abdominal: Soft. There is no tenderness.  Genitourinary:       Small amount of vaginal bleeding noted. Cervix closed. No CMT, no palpable enlargement of uterus.  Musculoskeletal: Normal range of motion.  Neurological: She is alert and oriented to person, place, and time. No cranial nerve deficit.  Skin: Skin is warm and  dry.    ED Course  Procedures  Study Result     *RADIOLOGY REPORT*   Clinical Data: Vaginal bleeding.  Status post abortion on 01/10/2011.   TRANSVAGINAL OB ULTRASOUND   Technique:  Transvaginal ultrasound was performed for evaluation of the gestation as well as the maternal uterus and adnexal regions.   Comparison: 01/10/2011.   Findings: Heterogeneous endometrium containing areas of decreased echogenicity and measuring 13.7 mm in maximum thickness. Otherwise, normal appearing uterus.  The ovaries have normal appearances.  No free peritoneal fluid is seen.   IMPRESSION: Thickened, heterogeneous endometrium containing hypoechoic areas. The hypoechoic areas could represent areas of hemorrhage or retained products of conception or both.   Original Report Authenticated By: Darrol Angel, M.D.     Results for orders placed during the hospital encounter of 02/10/11 (from the past 24 hour(s))  POCT PREGNANCY, URINE     Status: Normal   Collection Time   02/10/11  2:22 PM      Component Value Range   Preg Test, Ur POSITIVE    HCG, QUANTITATIVE, PREGNANCY     Status: Abnormal   Collection Time   02/10/11  3:26 PM      Component Value Range   hCG, Beta Chain, Quant, S 322 (*) <5 (mIU/mL)  ABO/RH  Status: Normal   Collection Time   02/10/11  3:26 PM      Component Value Range   ABO/RH(D) A POS    CBC     Status: Abnormal   Collection Time   02/10/11  3:26 PM      Component Value Range   WBC 4.8  4.0 - 10.5 (K/uL)   RBC 3.48 (*) 3.87 - 5.11 (MIL/uL)   Hemoglobin 10.3 (*) 12.0 - 15.0 (g/dL)   HCT 16.1 (*) 09.6 - 46.0 (%)   MCV 89.9  78.0 - 100.0 (fL)   MCH 29.6  26.0 - 34.0 (pg)   MCHC 32.9  30.0 - 36.0 (g/dL)   RDW 04.5  40.9 - 81.1 (%)   Platelets 249  150 - 400 (K/uL)  DIFFERENTIAL     Status: Normal   Collection Time   02/10/11  3:26 PM      Component Value Range   Neutrophils Relative 49  43 - 77 (%)   Neutro Abs 2.3  1.7 - 7.7 (K/uL)   Lymphocytes Relative 37  12  - 46 (%)   Lymphs Abs 1.8  0.7 - 4.0 (K/uL)   Monocytes Relative 10  3 - 12 (%)   Monocytes Absolute 0.5  0.1 - 1.0 (K/uL)   Eosinophils Relative 3  0 - 5 (%)   Eosinophils Absolute 0.2  0.0 - 0.7 (K/uL)   Basophils Relative 1  0 - 1 (%)   Basophils Absolute 0.1  0.0 - 0.1 (K/uL)    Assessment: Vaginal bleeding    Early pregnancy vs elevated Bhcg s/p TAB  Plan:  Discussed with Dr. Debroah Loop and since hgb. Is stable and bleeding is light to moderate will discharge patient and have her return in 2 days for follow up Bhcg.    Plan discussed with patient.      [place], NP 02/10/11 1800

## 2011-02-12 ENCOUNTER — Encounter (HOSPITAL_COMMUNITY): Payer: Self-pay | Admitting: *Deleted

## 2011-02-12 ENCOUNTER — Inpatient Hospital Stay (HOSPITAL_COMMUNITY)
Admission: AD | Admit: 2011-02-12 | Discharge: 2011-02-12 | Disposition: A | Discharge status: Home / Self Care | Payer: Self-pay | Source: Ambulatory Visit | Attending: Obstetrics and Gynecology | Admitting: Obstetrics and Gynecology

## 2011-02-12 DIAGNOSIS — O039 Complete or unspecified spontaneous abortion without complication: Secondary | ICD-10-CM | POA: Insufficient documentation

## 2011-02-12 HISTORY — DX: Headache: R51

## 2011-02-12 HISTORY — DX: Unspecified abnormal cytological findings in specimens from cervix uteri: R87.619

## 2011-02-12 HISTORY — DX: Papillomavirus as the cause of diseases classified elsewhere: B97.7

## 2011-02-12 HISTORY — DX: Reserved for concepts with insufficient information to code with codable children: IMO0002

## 2011-02-12 HISTORY — DX: Trichomoniasis, unspecified: A59.9

## 2011-02-12 HISTORY — DX: Urinary tract infection, site not specified: N39.0

## 2011-02-12 HISTORY — DX: Chlamydial infection, unspecified: A74.9

## 2011-02-12 LAB — GC/CHLAMYDIA PROBE AMP, GENITAL: Chlamydia, DNA Probe: NEGATIVE

## 2011-02-12 NOTE — ED Provider Notes (Signed)
History   Pt presents today for repeat B-quant. She has had vag bleeding in preg. She had an induced AB in August and was uncertain if she had retained products or if she had become pregnant once again. She states her bleeding has now slowed tremendously and she denies severe abd pain or any other sx.   HPI  OB History    Grav Para Term Preterm Abortions TAB SAB Ect Mult Living   7 2 2  4 4    2       Past Medical History  Diagnosis Date  . Anemia   . Headache   . Urinary tract infection   . Abnormal Pap smear   . Human papilloma virus     cells removed x2  . Chlamydia   . Trichomonas     Past Surgical History  Procedure Date  . Cystectomy   . Dilation and curettage of uterus   . Therapeutic abortion     No family history on file.  History  Substance Use Topics  . Smoking status: Current Everyday Smoker -- 0.2 packs/day for 10 years  . Smokeless tobacco: Not on file  . Alcohol Use: Yes    Allergies: No Known Allergies  Prescriptions prior to admission  Medication Sig Dispense Refill  . amoxicillin (AMOXIL) 500 MG capsule Take 500 mg by mouth 3 (three) times daily.        . calcium carbonate (TUMS - DOSED IN MG ELEMENTAL CALCIUM) 500 MG chewable tablet Chew 1-2 tablets by mouth daily as needed. For heartburn      . Chlorphen-Pseudoephed-APAP (THERAFLU COLD/SORE THROAT PO) Take 1 Package by mouth daily. For cold symptom relief       . diphenhydrAMINE (BENADRYL) 25 MG tablet Take 25 mg by mouth at bedtime as needed. For allergy symptom relief       . fluconazole (DIFLUCAN) 150 MG tablet Take 150 mg by mouth once.        Marland Kitchen HYDROcodone-acetaminophen (NORCO) 5-325 MG per tablet Take 1 tablet by mouth every 4 (four) hours as needed.          Review of Systems  Constitutional: Negative for fever.  Cardiovascular: Negative for chest pain.  Gastrointestinal: Positive for abdominal pain. Negative for nausea, vomiting, diarrhea, constipation and blood in stool.    Genitourinary: Negative for dysuria, urgency, frequency, hematuria and flank pain.  Neurological: Negative for dizziness and headaches.  Psychiatric/Behavioral: Negative for depression and suicidal ideas.   Physical Exam   Blood pressure 99/68, pulse 70, temperature 98.2 F (36.8 C), temperature source Oral, resp. rate 18, last menstrual period 11/26/2010, unknown if currently breastfeeding.  Physical Exam  Constitutional: She is oriented to person, place, and time. She appears well-developed and well-nourished. No distress.  HENT:  Head: Normocephalic and atraumatic.  Eyes: EOM are normal. Pupils are equal, round, and reactive to light.  GI: Soft. She exhibits no distension. There is no tenderness. There is no rebound and no guarding.  Neurological: She is alert and oriented to person, place, and time.  Skin: Skin is warm and dry. She is not diaphoretic.  Psychiatric: She has a normal mood and affect. Her behavior is normal. Judgment and thought content normal.    MAU Course  Procedures  Results for orders placed during the hospital encounter of 02/12/11 (from the past 48 hour(s))  HCG, QUANTITATIVE, PREGNANCY     Status: Abnormal   Collection Time   02/12/11 10:37 AM  Component Value Range Comment   hCG, Beta Chain, Quant, S 202 (*) <5 (mIU/mL)     Assessment and Plan  SAB: pt has decreasing B-quants. Discussed with pt at length. She will return in 1 wk for repeat B-quant and [place] if needed. Discussed diet, activity, risks, and precautions.  Clinton Gallant. Haston Casebolt III, DrHSc, MPAS, PA-C  02/12/2011, 12:11 PM   Henrietta Hoover, PA 02/12/11 1215

## 2011-02-12 NOTE — ED Notes (Signed)
Changes in hormone level and prev [place] reviewed with pt.  Suspect is end of preg, that she had EAB.  Plan for pt to return in 1 wk for repeat BHCG

## 2011-02-12 NOTE — Progress Notes (Signed)
Here for follow up.  Reports bleeding as regular, 3 pads a day, less than Sat.     Procedure on Aug 9, spotting initially off and on, was put on birth control, is on part of pills where she would have cycle.  Bleeding started heavy on Sat.

## 2011-02-21 NOTE — ED Provider Notes (Signed)
Agree with above note.  Benita Boonstra 02/21/2011 9:01 AM

## 2011-02-22 LAB — GC/CHLAMYDIA PROBE AMP, GENITAL
Chlamydia, DNA Probe: NEGATIVE
GC Probe Amp, Genital: NEGATIVE

## 2011-02-22 LAB — WET PREP, GENITAL: Yeast Wet Prep HPF POC: NONE SEEN

## 2011-03-02 ENCOUNTER — Inpatient Hospital Stay (INDEPENDENT_AMBULATORY_CARE_PROVIDER_SITE_OTHER)
Admission: RE | Admit: 2011-03-02 | Discharge: 2011-03-02 | Disposition: A | Discharge status: Home / Self Care | Payer: Self-pay | Source: Ambulatory Visit | Attending: Emergency Medicine | Admitting: Emergency Medicine

## 2011-03-02 DIAGNOSIS — N898 Other specified noninflammatory disorders of vagina: Secondary | ICD-10-CM

## 2011-03-02 LAB — POCT URINALYSIS DIP (DEVICE)
Bilirubin Urine: NEGATIVE
Glucose, UA: NEGATIVE mg/dL
Ketones, ur: NEGATIVE mg/dL
Specific Gravity, Urine: 1.025 (ref 1.005–1.030)
pH: 7 (ref 5.0–8.0)

## 2011-03-02 LAB — GC/CHLAMYDIA PROBE AMP, GENITAL: GC Probe Amp, Genital: NEGATIVE

## 2011-03-02 LAB — POCT PREGNANCY, URINE: Preg Test, Ur: NEGATIVE

## 2011-03-02 LAB — WET PREP, GENITAL
Clue Cells Wet Prep HPF POC: NONE SEEN
Trich, Wet Prep: NONE SEEN
Trich, Wet Prep: NONE SEEN
Yeast Wet Prep HPF POC: NONE SEEN

## 2011-03-03 LAB — GC/CHLAMYDIA PROBE AMP, GENITAL
Chlamydia, DNA Probe: NEGATIVE
GC Probe Amp, Genital: NEGATIVE

## 2011-03-14 LAB — HEPATIC FUNCTION PANEL
ALT: 14
Albumin: 4.1
Alkaline Phosphatase: 45
Indirect Bilirubin: 0.6
Total Bilirubin: 0.7
Total Protein: 7

## 2011-03-14 LAB — DIFFERENTIAL
Eosinophils Relative: 0
Lymphocytes Relative: 13
Lymphs Abs: 1.1
Monocytes Relative: 3

## 2011-03-14 LAB — BASIC METABOLIC PANEL
BUN: 10
Chloride: 104
GFR calc Af Amer: >60
GFR calc non Af Amer: >60
Potassium: 3.8
Sodium: 140

## 2011-03-14 LAB — URINALYSIS, ROUTINE W REFLEX MICROSCOPIC
Glucose, UA: NEGATIVE
Protein, ur: NEGATIVE
pH: 5.5

## 2011-03-14 LAB — URINE MICROSCOPIC-ADD ON

## 2011-03-14 LAB — CBC
HCT: 35.5 — ABNORMAL LOW
Platelets: 232
RBC: 4.05
WBC: 8.5

## 2011-03-21 ENCOUNTER — Inpatient Hospital Stay (HOSPITAL_COMMUNITY)
Admission: AD | Admit: 2011-03-21 | Discharge: 2011-03-21 | Disposition: A | Discharge status: Home / Self Care | Payer: Self-pay | Source: Ambulatory Visit | Attending: Obstetrics & Gynecology | Admitting: Obstetrics & Gynecology

## 2011-03-21 ENCOUNTER — Encounter (HOSPITAL_COMMUNITY): Payer: Self-pay

## 2011-03-21 DIAGNOSIS — B9689 Other specified bacterial agents as the cause of diseases classified elsewhere: Secondary | ICD-10-CM | POA: Insufficient documentation

## 2011-03-21 DIAGNOSIS — A499 Bacterial infection, unspecified: Secondary | ICD-10-CM | POA: Insufficient documentation

## 2011-03-21 DIAGNOSIS — R109 Unspecified abdominal pain: Secondary | ICD-10-CM | POA: Insufficient documentation

## 2011-03-21 DIAGNOSIS — N760 Acute vaginitis: Secondary | ICD-10-CM | POA: Insufficient documentation

## 2011-03-21 HISTORY — DX: Gastro-esophageal reflux disease without esophagitis: K21.9

## 2011-03-21 LAB — CBC
HCT: 34.5 % — ABNORMAL LOW (ref 36.0–46.0)
MCHC: 32.8 g/dL (ref 30.0–36.0)
MCV: 89.6 fL (ref 78.0–100.0)
Platelets: 291 10*3/uL (ref 150–400)
RDW: 12.5 % (ref 11.5–15.5)
WBC: 6.1 10*3/uL (ref 4.0–10.5)

## 2011-03-21 LAB — URINALYSIS, ROUTINE W REFLEX MICROSCOPIC
Bilirubin Urine: NEGATIVE
Nitrite: NEGATIVE
Protein, ur: NEGATIVE mg/dL
Specific Gravity, Urine: 1.025 (ref 1.005–1.030)
Urobilinogen, UA: 0.2 mg/dL (ref 0.0–1.0)

## 2011-03-21 LAB — WET PREP, GENITAL: Yeast Wet Prep HPF POC: NONE SEEN

## 2011-03-21 LAB — POCT PREGNANCY, URINE: Preg Test, Ur: NEGATIVE

## 2011-03-21 MED ORDER — METRONIDAZOLE 500 MG PO TABS: 500.0000 mg | ORAL_TABLET | Freq: Two times a day (BID) | ORAL | Status: AC

## 2011-03-21 NOTE — ED Provider Notes (Signed)
History     Chief Complaint  Patient presents with  . Abdominal Pain   HPI Ashley Cole 25 y.o. comes to MAU with continued bleeding and vaginal discharge.  Had TAB on 01/11/11.  Has been on pills since then but has continued to have odor and discharge, often spotting.  GC/Chlam done 2 weeks ago was negative and reports no intercourse since then.     OB History    Grav Para Term Preterm Abortions TAB SAB Ect Mult Living   8 2 2  5 4    2       Past Medical History  Diagnosis Date  . Anemia   . Headache   . Urinary tract infection   . Abnormal Pap smear   . Human papilloma virus     cells removed x2  . Chlamydia   . Trichomonas   . GERD (gastroesophageal reflux disease)     Past Surgical History  Procedure Date  . Cystectomy   . Dilation and curettage of uterus   . Therapeutic abortion     Family History  Problem Relation Age of Onset  . Hypertension Mother   . Fibroids Mother   . Cancer Maternal Grandfather     History  Substance Use Topics  . Smoking status: Current Everyday Smoker -- 0.2 packs/day for 10 years  . Smokeless tobacco: Not on file  . Alcohol Use: Yes    Allergies: No Known Allergies  Prescriptions prior to admission  Medication Sig Dispense Refill  . Norethindrone-Ethinyl Estradiol-Fe (GENERESS FE) 0.8-25 MG-MCG tablet Chew 1 tablet by mouth daily.        Marland Kitchen amoxicillin (AMOXIL) 500 MG capsule Take 500 mg by mouth 3 (three) times daily.        . calcium carbonate (TUMS - DOSED IN MG ELEMENTAL CALCIUM) 500 MG chewable tablet Chew 1-2 tablets by mouth daily as needed. For heartburn      . Chlorphen-Pseudoephed-APAP (THERAFLU COLD/SORE THROAT PO) Take 1 Package by mouth daily. For cold symptom relief       . diphenhydrAMINE (BENADRYL) 25 MG tablet Take 25 mg by mouth at bedtime as needed. For allergy symptom relief       . fluconazole (DIFLUCAN) 150 MG tablet Take 150 mg by mouth once.        Marland Kitchen HYDROcodone-acetaminophen (NORCO) 5-325 MG per  tablet Take 1 tablet by mouth every 4 (four) hours as needed.          Review of Systems  Gastrointestinal: Positive for abdominal pain.  Genitourinary:       Vaginal discharge with spotting   Physical Exam   Blood pressure 119/84, pulse 96, temperature 99.3 F (37.4 C), temperature source Oral, resp. rate 16, height 5\' 1"  (1.549 m), weight 124 lb 9.6 oz (56.518 kg), last menstrual period 11/26/2010, SpO2 99.00%, unknown if currently breastfeeding.  Physical Exam  Nursing note and vitals reviewed. Constitutional: She is oriented to person, place, and time. She appears well-developed and well-nourished.  HENT:  Head: Normocephalic.  Eyes: EOM are normal.  Neck: Neck supple.  GI: Soft. There is no tenderness. There is no rebound and no guarding.  Genitourinary:       Speculum exam: Vagina - Small amount of creamy discharge, no blood seen, no odor Cervix - No contact bleeding Bimanual exam: Cervix closed Uterus firm and retroverted, mildly tender, normal size Adnexa mild tenderness bilaterally, no masses bilaterally GC/Chlam, wet prep done Chaperone present for exam.  Musculoskeletal: Normal  range of motion.  Neurological: She is alert and oriented to person, place, and time.  Skin: Skin is warm and dry.  Psychiatric: She has a normal mood and affect.    MAU Course  Procedures  MDM Results for orders placed during the hospital encounter of 03/21/11 (from the past 24 hour(s))  POCT PREGNANCY, URINE     Status: Normal   Collection Time   03/21/11  8:55 AM      Component Value Range   Preg Test, Ur NEGATIVE    URINALYSIS, ROUTINE W REFLEX MICROSCOPIC     Status: Abnormal   Collection Time   03/21/11  9:03 AM      Component Value Range   Color, Urine YELLOW  YELLOW    Appearance CLEAR  CLEAR    Specific Gravity, Urine 1.025  1.005 - 1.030    pH 5.5  5.0 - 8.0    Glucose, UA NEGATIVE  NEGATIVE (mg/dL)   Hgb urine dipstick MODERATE (*) NEGATIVE    Bilirubin Urine  NEGATIVE  NEGATIVE    Ketones, ur NEGATIVE  NEGATIVE (mg/dL)   Protein, ur NEGATIVE  NEGATIVE (mg/dL)   Urobilinogen, UA 0.2  0.0 - 1.0 (mg/dL)   Nitrite NEGATIVE  NEGATIVE    Leukocytes, UA NEGATIVE  NEGATIVE   URINE MICROSCOPIC-ADD ON     Status: Abnormal   Collection Time   03/21/11  9:03 AM      Component Value Range   Squamous Epithelial / LPF FEW (*) RARE    RBC / HPF 3-6  <3 (RBC/hpf)  HCG, QUANTITATIVE, PREGNANCY     Status: Normal   Collection Time   03/21/11  9:20 AM      Component Value Range   hCG, Beta Chain, Quant, S <1  <5 (mIU/mL)  CBC     Status: Abnormal   Collection Time   03/21/11  9:20 AM      Component Value Range   WBC 6.1  4.0 - 10.5 (K/uL)   RBC 3.85 (*) 3.87 - 5.11 (MIL/uL)   Hemoglobin 11.3 (*) 12.0 - 15.0 (g/dL)   HCT 78.2 (*) 95.6 - 46.0 (%)   MCV 89.6  78.0 - 100.0 (fL)   MCH 29.4  26.0 - 34.0 (pg)   MCHC 32.8  30.0 - 36.0 (g/dL)   RDW 21.3  08.6 - 57.8 (%)   Platelets 291  150 - 400 (K/uL)  WET PREP, GENITAL     Status: Abnormal   Collection Time   03/21/11 11:15 AM      Component Value Range   Yeast, Wet Prep NONE SEEN  NONE SEEN    Trich, Wet Prep NONE SEEN  NONE SEEN    Clue Cells, Wet Prep MANY (*) NONE SEEN    WBC, Wet Prep HPF POC MODERATE (*) NONE SEEN      Assessment and Plan  Bacterial vaginosis  Plan: Get your prescription filled and take as directed. No alcohol while taking your medication. Continue birth control pills. If after finishing medication, you continue to have vaginal bleeding your pills may need to be changed (currently on 25 mcg pill). Make an appointment at the Health dept to be seen in [facility] if you need a different pill.   Harve Spradley 03/21/2011, 11:20 AM   Nolene Bernheim, NP 03/21/11 1303

## 2011-03-21 NOTE — Progress Notes (Signed)
Pt states that in August 2012 she had a TAB. Had to come back for bleeding and states she still had pregnancy tissue in her and still had a POS BHCG. Went to Urgent Care 2 weeks ago and states she still had a POS UPT. Pt states she had lower abdominal pain and a foul smelling vaginal discharge.

## 2011-03-22 NOTE — ED Provider Notes (Signed)
Attestation of Attending Supervision of Advanced Practitioner: Evaluation and management procedures were performed by the PA/NP/CNM/OB Fellow under my supervision/collaboration. Chart reviewed and agree with management and plan.  Malyssa Maris A 03/22/2011 2:51 PM   

## 2011-09-12 ENCOUNTER — Inpatient Hospital Stay (HOSPITAL_COMMUNITY)
Admission: AD | Admit: 2011-09-12 | Discharge: 2011-09-12 | Disposition: A | Discharge status: Home / Self Care | Payer: Medicaid Other | Source: Ambulatory Visit | Attending: Obstetrics | Admitting: Obstetrics

## 2011-09-12 ENCOUNTER — Encounter (HOSPITAL_COMMUNITY): Payer: Self-pay | Admitting: *Deleted

## 2011-09-12 DIAGNOSIS — R350 Frequency of micturition: Secondary | ICD-10-CM

## 2011-09-12 LAB — URINALYSIS, ROUTINE W REFLEX MICROSCOPIC
Glucose, UA: NEGATIVE mg/dL
Nitrite: NEGATIVE
Specific Gravity, Urine: >1.03 — ABNORMAL HIGH (ref 1.005–1.030)
pH: 5.5 (ref 5.0–8.0)

## 2011-09-12 LAB — URINE MICROSCOPIC-ADD ON

## 2011-09-12 LAB — POCT PREGNANCY, URINE: Preg Test, Ur: NEGATIVE

## 2011-09-12 NOTE — MAU Provider Note (Signed)
History     CSN: [id number]  Arrival date and time: 09/12/11 1152   First Provider Initiated Contact with Patient 09/12/11 1307      Chief Complaint  Patient presents with  . Urinary Frequency  . Possible Pregnancy   HPI Patient seen for urinary frequency that started 2 days ago.  Urine dark, strong smelling, feels pressure on her bladder.  She also thinks that she may be pregnant.  LMP 08/22/11.  Also has white vaginal discharge with slight odor.   Past Medical History  Diagnosis Date  . Anemia   . Headache   . Urinary tract infection   . Abnormal Pap smear   . Human papilloma virus     cells removed x2  . Chlamydia   . Trichomonas   . GERD (gastroesophageal reflux disease)     Past Surgical History  Procedure Date  . Dilation and curettage of uterus   . Therapeutic abortion   . Pilonidal cyst excision   . Gynecologic cryosurgery     Family History  Problem Relation Age of Onset  . Hypertension Mother   . Fibroids Mother   . Cancer Maternal Grandfather     History  Substance Use Topics  . Smoking status: Current Everyday Smoker -- 0.2 packs/day for 10 years  . Smokeless tobacco: Not on file  . Alcohol Use: Yes     Occas    Allergies: No Known Allergies  No prescriptions prior to admission    Review of Systems  Constitutional: Negative for fever and chills.  Gastrointestinal: Negative for nausea, vomiting, abdominal pain, diarrhea and constipation.  Genitourinary: Positive for frequency. Negative for dysuria, urgency, hematuria and flank pain.   Physical Exam   Blood pressure 115/73, pulse 101, temperature 97.3 F (36.3 C), temperature source Oral, resp. rate 16, height 5' (1.524 m), weight 53.706 kg (118 lb 6.4 oz), last menstrual period 08/28/2011, SpO2 100.00%.  Physical Exam  Constitutional: She appears well-developed and well-nourished.  HENT:  Head: Normocephalic and atraumatic.  GI: Soft. Bowel sounds are normal. She exhibits no  distension and no mass. There is no tenderness. There is no rebound and no guarding.  Neurological: She is alert.  Skin: Skin is warm and dry.  Psychiatric: She has a normal mood and affect. Her behavior is normal. Judgment and thought content normal.   Results for orders placed during the hospital encounter of 09/12/11 (from the past 24 hour(s))  URINALYSIS, ROUTINE W REFLEX MICROSCOPIC     Status: Abnormal   Collection Time   09/12/11 12:25 PM      Component Value Range   Color, Urine YELLOW  YELLOW    APPearance CLEAR  CLEAR    Specific Gravity, Urine >1.030 (*) 1.005 - 1.030    pH 5.5  5.0 - 8.0    Glucose, UA NEGATIVE  NEGATIVE (mg/dL)   Hgb urine dipstick LARGE (*) NEGATIVE    Bilirubin Urine NEGATIVE  NEGATIVE    Ketones, ur NEGATIVE  NEGATIVE (mg/dL)   Protein, ur NEGATIVE  NEGATIVE (mg/dL)   Urobilinogen, UA 0.2  0.0 - 1.0 (mg/dL)   Nitrite NEGATIVE  NEGATIVE    Leukocytes, UA TRACE (*) NEGATIVE   URINE MICROSCOPIC-ADD ON     Status: Abnormal   Collection Time   09/12/11 12:25 PM      Component Value Range   Squamous Epithelial / LPF FEW (*) RARE    WBC, UA 0-2  <3 (WBC/hpf)   RBC / HPF 7-10  <  3 (RBC/hpf)  POCT PREGNANCY, URINE     Status: Normal   Collection Time   09/12/11 12:36 PM      Component Value Range   Preg Test, Ur NEGATIVE  NEGATIVE     MAU Course  Procedures   Assessment and Plan  1.  Urinary Frequency. Will send patient's urine for culture.  Patient not pregnant.  Pelvic exam declined by patient.  Offered to preform wet prep, which was declined by patient.  Jenevieve Kirschbaum JEHIEL 09/12/2011, 1:36 PM

## 2011-09-12 NOTE — Discharge Instructions (Signed)
Urinary Frequency The number of times a normal person urinates depends upon how much liquid they take in and how much liquid they are losing. If the temperature is hot and there is high humidity then the person will sweat more and usually breathe a little more frequently. These factors decrease the amount of frequency of urination that would be considered normal. The amount you drink is easily determined, but the amount of fluid lost is sometimes more difficult to calculate.  Fluid is lost in two ways:  Sensible fluid loss is usually measured by the amount of urine that you get rid of. Losses of fluid can also occur with diarrhea.   Insensible fluid loss is more difficult to measure. It is caused by evaporation. Insensible loss of fluid occurs through breathing and sweating. It usually ranges from a little less than a quart to a little more than a quart of fluid a day.  In normal temperatures and activity levels the average person may urinate 4 to 7 times in a 24-hour period. Needing to urinate more often than that could indicate a problem. If one urinates 4 to 7 times in 24 hours and has large volumes each time, that could indicate a different problem from one who urinates 4 to 7 times a day and has small volumes. The time of urinating is also an important. Most urinating should be done during the waking hours. Getting up at night to urinate frequently can indicate some problems. CAUSES  The bladder is the organ in your lower abdomen that holds urine. Like a balloon, it swells some as it fills up. Your nerves sense this and tell you it is time to head for the bathroom. There are a number of reasons that you might feel the need to urinate more often than usual. They include:  Urinary tract infection. This is usually associated with other signs such as burning when you urinate.   In men, problems with the prostate (a walnut-size gland that is located near the tube that carries urine out of your body).  There are two reasons why the prostate can cause an increased frequency of urination:   An enlarged prostate that does not let the bladder empty well. If the bladder only half empties when you urinate then it only has half the capacity to fill before you have to urinate again.   The nerves in the bladder become more hypersensitive with an increased size of the prostate even if the bladder empties completely.   Pregnancy.   Obesity. Excess weight is more likely to cause a problem for women more than for men.   Bladder stones or other bladder problems.   Caffeine.   Alcohol.   Medications. For example, drugs that help the body get rid of extra fluid (diuretics) increase urine production. Some other medicines must be taken with lots of fluids.   Muscle or nerve weakness. This might be the result of a spinal cord injury, a stroke, multiple sclerosis or Parkinson's disease.   Long-standing diabetes can decrease the sensation of the bladder. This loss of sensation makes it harder to sense the bladder needs to be emptied. Over a period of years the bladder is stretched out by constant overfilling. This weakens the bladder muscles so that the bladder does not empty well and has less capacity to fill with new urine.   Interstitial cystitis (also called painful bladder syndrome). This condition develops because the tissues that line the insider of the bladder are inflamed (  inflammation is the body's way of reacting to injury or infection). It causes pain and frequent urination. It occurs in women more often than in men.  DIAGNOSIS   To decide what might be causing your urinary frequency, your healthcare provider will probably:   Ask about symptoms you have noticed.   Ask about your overall health. This will include questions about any medications you are taking.   Do a physical examination.   Order some tests. These might include:   A blood test to check for diabetes or other health issues  that could be contributing to the problem.   Urine testing. This could measure the flow of urine and the pressure on the bladder.   A test of your neurological system (the brain, spinal cord and nerves). This is the system that senses the need to urinate.   A bladder test to check whether it is emptying completely when you urinate.   Cytoscopy. This test uses a thin tube with a tiny camera on it. It offers a look inside your urethra and bladder to see if there are problems.   Imaging tests. You might be given a contrast dye and then asked to urinate. X-rays are taken to see how your bladder is working.  TREATMENT  It is important for you to be evaluated to determine if the amount or frequency that you have is unusual or abnormal. If it is found to be abnormal the cause should be determined and this can usually be found out easily. Depending upon the cause treatment could include medication, stimulation of the nerves, or surgery. There are not too many things that you can do as an individual to change your urinary frequency. It is important that you balance the amount of fluid intake needed to compensate for your activity and the temperature. Medical problems will be diagnosed and taken care of by your physician. There is no particular bladder training such as Kegel's exercises that you can do to help urinary frequency. This is an exercise this is usually done for people who have leaking of urine when they laugh cough or sneeze. HOME CARE INSTRUCTIONS   Take any medications your healthcare provider prescribed or suggested. Follow the directions carefully.   Practice any lifestyle changes that are recommended. These might include:   Drinking less fluid or drinking at different times of the day. If you need to urinate often during the night, for example, you may need to stop drinking fluids early in the evening.   Cutting down on caffeine or alcohol. They both can make you need to urinate more  often than normal. Caffeine is found in coffee, tea and sodas.   Losing weight, if that is recommended.   Keep a journal or a log. You might be asked to record how much you drink and when and when you feel the need to urinate. This will also help evaluate how well the treatment provided by your physician is working.  SEEK MEDICAL CARE IF:   Your need to urinate often gets worse.   You feel increased pain or irritation when you urinate.   You notice blood in your urine.   You have questions about any medications that your healthcare provider recommended.   You notice blood, pus or swelling at the site of any test or treatment procedure.   You develop a fever of more than 100.5 F (38.1 C).  SEEK IMMEDIATE MEDICAL CARE IF:  You develop a fever of more than 102.0   F (38.9 C). Document Released: 03/17/2009 Document Revised: 05/10/2011 Document Reviewed: 03/17/2009 [employer] Patient Information 2012 [employer]. 

## 2011-09-12 NOTE — MAU Note (Signed)
Patient states she has had urinary frequency for about 5 days. Urine has a strong odor. Had 2 periods in March and thinks she might be pregnancy. White creamy vaginal discharge with slight odor.

## 2011-09-14 ENCOUNTER — Other Ambulatory Visit: Payer: Self-pay | Admitting: Family Medicine

## 2011-09-14 LAB — URINE CULTURE: Culture  Setup Time: 201304110116

## 2011-09-14 MED ORDER — SULFAMETHOXAZOLE-TRIMETHOPRIM 800-160 MG PO TABS: 1.0000 | ORAL_TABLET | Freq: Two times a day (BID) | ORAL | Status: AC

## 2011-09-18 ENCOUNTER — Encounter (HOSPITAL_COMMUNITY): Payer: Self-pay | Admitting: Emergency Medicine

## 2011-09-18 ENCOUNTER — Emergency Department (HOSPITAL_COMMUNITY)
Admission: EM | Admit: 2011-09-18 | Discharge: 2011-09-18 | Disposition: A | Discharge status: Home / Self Care | Payer: Medicaid Other | Source: Home / Self Care | Attending: Emergency Medicine | Admitting: Emergency Medicine

## 2011-09-18 DIAGNOSIS — R0989 Other specified symptoms and signs involving the circulatory and respiratory systems: Secondary | ICD-10-CM

## 2011-09-18 DIAGNOSIS — R0600 Dyspnea, unspecified: Secondary | ICD-10-CM

## 2011-09-18 MED ORDER — PANTOPRAZOLE SODIUM 40 MG PO TBEC: 40.0000 mg | DELAYED_RELEASE_TABLET | Freq: Every day | ORAL | Status: DC

## 2011-09-18 MED ORDER — FAMOTIDINE 20 MG PO TABS: 20.0000 mg | ORAL_TABLET | Freq: Every day | ORAL | Status: DC
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
## 2011-09-18 NOTE — ED Notes (Signed)
Pt c/o not being able to breathe at night. She says it feels like she has forgotten how to breathe and she is worried she is going to die in her sleep so she is scared to sleep. Pt states she feels like her heart is beating too fast and she has anxiety and panic attacks self diagnosed. She is not on any medications. Pt says she has palpitations when scared or upset. Pt says she has a chest and back tightness and that every time she is seen for this only an EKG is done and she feels like she needs something "major done, like an x-ray and stuff". Pt does not appear in distress and VS are WNL

## 2011-09-18 NOTE — Discharge Instructions (Signed)
Your symptoms are most likely from reflux based on today's normal EKG in your reassuring history and physical. You will need to see a primary care for ongoing management. Call the family practice Center on the first of each month, they take the first 60 patient's. You do not need health insurance for this. Fill the Bactrim, make sure you finish all the medication. You have a urinary tract infection but is sensitive to Bactrim. Return if you start having severe chest pain, shortness of breath but will go away, a fever above 100.4, or for any other concerns.  Go to www.goodrx.com to look up your medications. This will give you a list of where you can find your prescriptions at the most affordable prices.   RESOURCE GUIDE  No Primary Care Doctor Call [facility]  [phone] Other agencies that provide inexpensive medical care    [facility]  [phone]    [facility]  [phone]    [facility]  [phone]    [facility]  [phone] [address] [place] [address]    [facility]  [phone]    [facility]  [phone] [facility] [phone]   [address] [place] [address] [place], [place] [address]  [facility]  [phone] [facility]  [phone] [facility]   [phone] (emergency services [phone])   [facility]  [facility]     [employer]                          [facility] Dept. [address] [place]                       [address]      [address] [place] [address]   [phone] (After Hours)

## 2011-10-02 ENCOUNTER — Inpatient Hospital Stay (HOSPITAL_COMMUNITY)
Admission: AD | Admit: 2011-10-02 | Discharge: 2011-10-02 | Disposition: A | Discharge status: Home / Self Care | Payer: Medicaid Other | Source: Ambulatory Visit | Attending: Obstetrics | Admitting: Obstetrics

## 2011-10-02 ENCOUNTER — Encounter (HOSPITAL_COMMUNITY): Payer: Self-pay

## 2011-10-02 DIAGNOSIS — J302 Other seasonal allergic rhinitis: Secondary | ICD-10-CM

## 2011-10-02 DIAGNOSIS — R0602 Shortness of breath: Secondary | ICD-10-CM | POA: Insufficient documentation

## 2011-10-02 DIAGNOSIS — O99891 Other specified diseases and conditions complicating pregnancy: Secondary | ICD-10-CM | POA: Insufficient documentation

## 2011-10-02 DIAGNOSIS — R109 Unspecified abdominal pain: Secondary | ICD-10-CM | POA: Insufficient documentation

## 2011-10-02 DIAGNOSIS — R002 Palpitations: Secondary | ICD-10-CM | POA: Insufficient documentation

## 2011-10-02 DIAGNOSIS — K219 Gastro-esophageal reflux disease without esophagitis: Secondary | ICD-10-CM

## 2011-10-02 LAB — URINALYSIS, ROUTINE W REFLEX MICROSCOPIC
Bilirubin Urine: NEGATIVE
Leukocytes, UA: NEGATIVE
Nitrite: NEGATIVE
Specific Gravity, Urine: 1.02 (ref 1.005–1.030)
Urobilinogen, UA: 0.2 mg/dL (ref 0.0–1.0)
pH: 7.5 (ref 5.0–8.0)

## 2011-10-02 LAB — URINE MICROSCOPIC-ADD ON: WBC, UA: NONE SEEN WBC/hpf (ref <3)

## 2011-10-02 MED ORDER — LORATADINE 10 MG PO TABS
10.0000 mg | ORAL_TABLET | Freq: Once | ORAL | Status: AC
  Administered 2011-10-02: 10 mg via ORAL
  Filled 2011-10-02 (×2): qty 1

## 2011-10-02 MED ORDER — GI COCKTAIL ~~LOC~~
30.0000 mL | Freq: Once | ORAL | Status: AC
  Administered 2011-10-02: 30 mL via ORAL
  Filled 2011-10-02: qty 30

## 2011-10-02 NOTE — MAU Note (Signed)
Patient states she has been follow up by Dr. Gaynell Face for [facility] in outpatient lab. Has been having problems with breathing for about one month. Was seen at Urgent Care and told it was acid reflux but she has not taken any medication because she found out she was pregnant. Having some lower abdominal cramping on and off. Feels like upper chest gets tight and radiates to back. States she "feels like I don't remember how to breath and struggling to breathe". Normal breathing and o2 sat 100% in triage.

## 2011-10-02 NOTE — MAU Provider Note (Signed)
Ashley Cole [id number] @ [redacted]w[redacted]d by LMP. Chief Complaint  Patient presents with  . Abdominal Cramping  . Shortness of Breath   First Provider Initiated Contact with Patient 10/02/11 1224     SUBJECTIVE  HPI: Reports intermittent discomfort in mid-chest radiating down sternum and epigastric area and SOB x 1 month. SOB worsens w/ exertion and pt thinks the exacerbation may have coincided w/ her seasonal allergies. She has not taken any allergy meds. Pain does not increase w/ exertion.. She has a Hx of similar Sx x ~5 years and had appt w/ cardiologist yesterday that she missed and decided to come to MAU as a result. She was seen at [facility] 09/18/11, had a normal ECG and Dx GERD. She did not take the meds Rx'd due to concerns about safety in pregnancy. She reports palpitations and denies tachycardia, syncope or calf pain. She states she has never had a cardiology work-up. She is having quants followed at Dr. Elsie Stain office including this morning. She did not mention the SOB or discomfort at that appt. She denies abd pain or VB today.   Past Medical History  Diagnosis Date  . Anemia   . Headache   . Urinary tract infection   . Abnormal Pap smear   . Human papilloma virus     cells removed x2  . Chlamydia   . Trichomonas   . GERD (gastroesophageal reflux disease)    Past Surgical History  Procedure Date  . Dilation and curettage of uterus   . Therapeutic abortion   . Pilonidal cyst excision   . Gynecologic cryosurgery    History   Social History  . Marital Status: Single    Spouse Name: N/A    Number of Children: N/A  . Years of Education: N/A   Occupational History  . Not on file.   Social History Main Topics  . Smoking status: Current Everyday Smoker -- 0.2 packs/day for 14 years  . Smokeless tobacco: Not on file  . Alcohol Use: Yes     Occas  . Drug Use: No  . Sexually Active: Yes    Birth Control/ Protection: None     Last intercourse last night   Other  Topics Concern  . Not on file   Social History Narrative  . No narrative on file   No current facility-administered medications on file prior to encounter.   Current Outpatient Prescriptions on File Prior to Encounter  Medication Sig Dispense Refill  . famotidine (PEPCID) 20 MG tablet Take 1 tablet (20 mg total) by mouth daily.  20 tablet  0  . pantoprazole (PROTONIX) 40 MG tablet Take 1 tablet (40 mg total) by mouth daily.  20 tablet  0   No Known Allergies  ROS: Pertinent items in HPI  OBJECTIVE Blood pressure 114/63, pulse 92, temperature 98.3 F (36.8 C), temperature source Oral, resp. rate 20, height 4' 10.5" (1.486 m), weight 54.795 kg (120 lb 12.8 oz), last menstrual period 08/28/2011, SpO2 100.00%.  GENERAL: Well-developed, well-nourished female in no acute distress.  HEENT: Normocephalic. HEART: normal rate and rhythm RESP: normal effort, CTAB ABDOMEN: Soft, nontender EXTREMITIES: Nontender, no edema. Neg Homan's. No clubbing. NEURO: Alert and oriented SPECULUM EXAM: Deferred   LAB RESULTS  IMAGING  ED Course: Good relief of chest pain w/ GI cocktail. Improved breathing after Claritin.  ASSESSMENT 1. GERD (gastroesophageal reflux disease)   2. Seasonal allergies   3. Heart palpitations    PLAN D/C home per  Dr. Gaynell Face. Discussed safe GERD and Allergy meds in pregnancy Follow-up Information    Schedule an appointment as soon as possible for a visit with Kathreen Cosier, MD.   Contact information:   [address] [place] [address] [phone]       Follow up with Cardiologist on 10/05/2011. [facility]  Department as needed if symptoms worsen)         Ashley Cole, Ashley Cole 10/02/2011 12:12 PM

## 2011-10-02 NOTE — Discharge Instructions (Signed)
Reflux medications (Pepcid, Zantac, Prilosec, Prevacid, Tums, Gaviscon, Protonix) are safe in pregnancy. Claritin, Zyrtec and Benadryl are allergy medications that are safe to take in pregnancy.

## 2011-11-27 ENCOUNTER — Inpatient Hospital Stay (HOSPITAL_COMMUNITY)
Admission: AD | Admit: 2011-11-27 | Discharge: 2011-11-27 | Disposition: A | Discharge status: Home / Self Care | Payer: Medicaid Other | Source: Ambulatory Visit | Attending: Obstetrics and Gynecology | Admitting: Obstetrics and Gynecology

## 2011-11-27 ENCOUNTER — Encounter (HOSPITAL_COMMUNITY): Payer: Self-pay | Admitting: *Deleted

## 2011-11-27 DIAGNOSIS — D649 Anemia, unspecified: Secondary | ICD-10-CM | POA: Insufficient documentation

## 2011-11-27 DIAGNOSIS — O26899 Other specified pregnancy related conditions, unspecified trimester: Secondary | ICD-10-CM

## 2011-11-27 DIAGNOSIS — O99019 Anemia complicating pregnancy, unspecified trimester: Secondary | ICD-10-CM | POA: Insufficient documentation

## 2011-11-27 DIAGNOSIS — O219 Vomiting of pregnancy, unspecified: Secondary | ICD-10-CM

## 2011-11-27 DIAGNOSIS — O210 Mild hyperemesis gravidarum: Secondary | ICD-10-CM | POA: Insufficient documentation

## 2011-11-27 DIAGNOSIS — R51 Headache: Secondary | ICD-10-CM | POA: Insufficient documentation

## 2011-11-27 HISTORY — DX: Other reaction to spinal and lumbar puncture: G97.1

## 2011-11-27 HISTORY — DX: Anxiety disorder, unspecified: F41.9

## 2011-11-27 LAB — URINALYSIS, ROUTINE W REFLEX MICROSCOPIC
Bilirubin Urine: NEGATIVE
Glucose, UA: NEGATIVE mg/dL
Ketones, ur: NEGATIVE mg/dL
Specific Gravity, Urine: 1.02 (ref 1.005–1.030)
pH: 6 (ref 5.0–8.0)

## 2011-11-27 LAB — CBC
HCT: 29.1 % — ABNORMAL LOW (ref 36.0–46.0)
MCHC: 33.7 g/dL (ref 30.0–36.0)
Platelets: 228 10*3/uL (ref 150–400)
RDW: 12.7 % (ref 11.5–15.5)

## 2011-11-27 LAB — DIFFERENTIAL
Basophils Absolute: 0 10*3/uL (ref 0.0–0.1)
Basophils Relative: 1 % (ref 0–1)
Monocytes Absolute: 0.5 10*3/uL (ref 0.1–1.0)
Neutro Abs: 3.5 10*3/uL (ref 1.7–7.7)
Neutrophils Relative %: 57 % (ref 43–77)

## 2011-11-27 LAB — COMPREHENSIVE METABOLIC PANEL
AST: 16 U/L (ref 0–37)
Albumin: 3.4 g/dL — ABNORMAL LOW (ref 3.5–5.2)
Calcium: 9.4 mg/dL (ref 8.4–10.5)
Chloride: 101 mEq/L (ref 96–112)
Creatinine, Ser: 0.52 mg/dL (ref 0.50–1.10)
Sodium: 134 mEq/L — ABNORMAL LOW (ref 135–145)
Total Bilirubin: 0.1 mg/dL — ABNORMAL LOW (ref 0.3–1.2)

## 2011-11-27 LAB — WET PREP, GENITAL
Clue Cells Wet Prep HPF POC: NONE SEEN
Trich, Wet Prep: NONE SEEN
Yeast Wet Prep HPF POC: NONE SEEN

## 2011-11-27 LAB — URINE MICROSCOPIC-ADD ON

## 2011-11-27 MED ORDER — FAMOTIDINE IN NACL 20-0.9 MG/50ML-% IV SOLN
20.0000 mg | Freq: Once | INTRAVENOUS | Status: AC
  Administered 2011-11-27: 20 mg via INTRAVENOUS
  Filled 2011-11-27: qty 50

## 2011-11-27 MED ORDER — PROMETHAZINE HCL 25 MG PO TABS
12.5000 mg | ORAL_TABLET | Freq: Four times a day (QID) | ORAL | Status: DC | PRN
Start: 2011-11-27 — End: 2012-04-17

## 2011-11-27 MED ORDER — DIPHENHYDRAMINE HCL 50 MG/ML IJ SOLN
25.0000 mg | Freq: Once | INTRAMUSCULAR | Status: AC
  Administered 2011-11-27: 25 mg via INTRAVENOUS
  Filled 2011-11-27: qty 1

## 2011-11-27 MED ORDER — ONDANSETRON HCL 4 MG/2ML IJ SOLN
4.0000 mg | Freq: Once | INTRAMUSCULAR | Status: AC
  Administered 2011-11-27: 4 mg via INTRAVENOUS
  Filled 2011-11-27: qty 2

## 2011-11-27 MED ORDER — LACTATED RINGERS IV BOLUS (SEPSIS)
1000.0000 mL | Freq: Once | INTRAVENOUS | Status: AC
  Administered 2011-11-27 (×2): 1000 mL via INTRAVENOUS

## 2011-11-27 MED ORDER — DEXAMETHASONE SODIUM PHOSPHATE 10 MG/ML IJ SOLN
10.0000 mg | Freq: Once | INTRAMUSCULAR | Status: AC
  Administered 2011-11-27: 10 mg via INTRAVENOUS
  Filled 2011-11-27: qty 1

## 2011-11-27 NOTE — Discharge Instructions (Signed)
Take your prenatal vitamins at bedtime. Use the phenergan as needed for nausea. Follow up in the office for your prenatal care. Return here as needed.  General Headache, Without Cause A general headache has no specific cause. These headaches are not life-threatening. They will not lead to other types of headaches. HOME CARE   Make and keep follow-up visits with your doctor.   Only take medicine as told by your doctor.   Try to relax, get a massage, or use your thoughts to control your body (biofeedback).   Apply cold or heat to the head and neck. Apply 3 or 4 times a day or as needed.  Finding out the results of your test Ask when your test results will be ready. Make sure you get your test results. GET HELP RIGHT AWAY IF:   You have problems with medicine.   Your medicine does not help relieve pain.   Your headache changes or becomes worse.   You feel sick to your stomach (nauseous) or throw up (vomit).   You have a temperature by mouth above 102 F (38.9 C), not controlled by medicine.   Your have a stiff neck.   You have vision loss.   You have muscle weakness.   You lose control of your muscles.   You lose balance or have trouble walking.   You feel like you are going to pass out (faint).  MAKE SURE YOU:   Understand these instructions.   Will watch this condition.   Will get help right away if you are not doing well or get worse.  Document Released: 02/28/2008 Document Revised: 05/10/2011 Document Reviewed: 02/28/2008 [facility] Patient Information 2012 [place], [place].General Headache, Without Cause A general headache has no specific cause. These headaches are not life-threatening. They will not lead to other types of headaches. HOME CARE   Make and keep follow-up visits with your doctor.   Only take medicine as told by your doctor.   Try to relax, get a massage, or use your thoughts to control your body (biofeedback).   Apply cold or heat to the head and  neck. Apply 3 or 4 times a day or as needed.  Finding out the results of your test Ask when your test results will be ready. Make sure you get your test results. GET HELP RIGHT AWAY IF:   You have problems with medicine.   Your medicine does not help relieve pain.   Your headache changes or becomes worse.   You feel sick to your stomach (nauseous) or throw up (vomit).   You have a temperature by mouth above 102 F (38.9 C), not controlled by medicine.   Your have a stiff neck.   You have vision loss.   You have muscle weakness.   You lose control of your muscles.   You lose balance or have trouble walking.   You feel like you are going to pass out (faint).  MAKE SURE YOU:   Understand these instructions.   Will watch this condition.   Will get help right away if you are not doing well or get worse.  Document Released: 02/28/2008 Document Revised: 05/10/2011 Document Reviewed: 02/28/2008 [facility] Patient Information 2012 [place], [place].

## 2011-11-27 NOTE — MAU Provider Note (Signed)
History     CSN: [id number]  Arrival date & time 11/27/11  4696   None     Chief Complaint  Patient presents with  . Headache   HPI Ashley Cole is a 26 y.o. female @ 105w0d gestation who presents to MAU for headache. The headache started yesterday afternoon. She describes the pain as sharp. The pain is located in the back of the head and behind eyes. She rates the pain as 8/10. She has had headaches similar to this in the past. May have one as often as once a month for the past 5 years. Never had a work up for headaches. Patient believes that they are a result of the epidural she had with her son 5 years ago. The pain is worse with bright lights, noise and certain positions. Nothing makes the pain better. Took extra-strength tylenol without relief. Last took tylenol 5 hours ago 1,000 mg. She also complains nausea and vomiting. The history was provided by the patient.  Past Medical History  Diagnosis Date  . Anemia   . Headache   . Urinary tract infection   . Abnormal Pap smear   . Human papilloma virus     cells removed x2  . Chlamydia   . Trichomonas   . GERD (gastroesophageal reflux disease)   . Spinal headache   . Anxiety     Past Surgical History  Procedure Date  . Dilation and curettage of uterus   . Therapeutic abortion   . Pilonidal cyst excision   . Gynecologic cryosurgery     Family History  Problem Relation Age of Onset  . Hypertension Mother   . Fibroids Mother   . Cancer Maternal Grandfather     History  Substance Use Topics  . Smoking status: Current Everyday Smoker -- 0.2 packs/day for 14 years  . Smokeless tobacco: Never Used  . Alcohol Use: No     Stopped with preg.    OB History    Grav Para Term Preterm Abortions TAB SAB Ect Mult Living   7 2 2  0 4 4 0 0 0 2      Review of Systems  Constitutional: Negative for fever, chills, diaphoresis and fatigue.  HENT: Positive for neck pain (shoulders feel tight and the pain radiates to the neck).  Negative for ear pain, congestion, sore throat, facial swelling, neck stiffness, dental problem and sinus pressure.   Eyes: Positive for photophobia. Negative for pain, discharge and visual disturbance.  Respiratory: Positive for cough. Negative for chest tightness and wheezing.   Cardiovascular: Negative for chest pain, palpitations and leg swelling.  Gastrointestinal: Positive for nausea. Negative for vomiting, abdominal pain (cramping, thinks from UTI), diarrhea, constipation and abdominal distention.  Genitourinary: Positive for urgency, frequency and vaginal discharge. Negative for dysuria, flank pain, vaginal bleeding and difficulty urinating.  Musculoskeletal: Positive for back pain. Negative for myalgias and gait problem.  Skin: Negative for color change and rash.  Neurological: Positive for dizziness and headaches. Negative for speech difficulty, weakness, light-headedness and numbness.  Psychiatric/Behavioral: Negative for confusion and agitation. Nervous/anxious: hx of anxiety and panic attacks, no medications.     Allergies  Review of patient's allergies indicates no known allergies.  Home Medications  No current outpatient prescriptions on file.  BP 98/43  Pulse 83  Temp 97 F (36.1 C) (Oral)  Resp 18  LMP 08/28/2011  Physical Exam  Nursing note and vitals reviewed. Constitutional: She is oriented to person, place, and time. She  appears well-developed and well-nourished. No distress.  HENT:  Head: Normocephalic.  Eyes: Conjunctivae and EOM are normal. Right eye exhibits no nystagmus. Left eye exhibits no nystagmus.  Neck: Normal range of motion. Neck supple. No JVD present.  Cardiovascular: Normal rate, regular rhythm and normal heart sounds.   No murmur heard. Pulmonary/Chest: Effort normal and breath sounds normal.  Abdominal: Soft. There is no tenderness.       Positive FHT's  Genitourinary:       External genitalia without lesions. White creamy discharge vaginal  vault. Cervix, long, closed, no CMT, no adnexal tenderness. Uterus approximately 14 week size.   Musculoskeletal: Normal range of motion. She exhibits no edema.       Muscle tightness posterior shoulders with radiation to neck. She is able to touch her chin to chest without difficulty and has full range of motion. No meningeal signs.  Neurological: She is alert and oriented to person, place, and time. She has normal strength. No cranial nerve deficit or sensory deficit. She displays a negative Romberg sign. Coordination and gait normal.  Reflex Scores:      Bicep reflexes are 2+ on the right side and 2+ on the left side.      Brachioradialis reflexes are 2+ on the right side and 2+ on the left side.      Patellar reflexes are 2+ on the right side and 2+ on the left side. Skin: Skin is warm and dry.  Psychiatric: She has a normal mood and affect. Her behavior is normal. Judgment and thought content normal.   Results for orders placed during the hospital encounter of 11/27/11 (from the past 24 hour(s))  URINALYSIS, ROUTINE W REFLEX MICROSCOPIC     Status: Abnormal   Collection Time   11/27/11  8:25 AM      Component Value Range   Color, Urine YELLOW  YELLOW   APPearance CLEAR  CLEAR   Specific Gravity, Urine 1.020  1.005 - 1.030   pH 6.0  5.0 - 8.0   Glucose, UA NEGATIVE  NEGATIVE mg/dL   Hgb urine dipstick TRACE (*) NEGATIVE   Bilirubin Urine NEGATIVE  NEGATIVE   Ketones, ur NEGATIVE  NEGATIVE mg/dL   Protein, ur NEGATIVE  NEGATIVE mg/dL   Urobilinogen, UA 0.2  0.0 - 1.0 mg/dL   Nitrite NEGATIVE  NEGATIVE   Leukocytes, UA NEGATIVE  NEGATIVE  URINE MICROSCOPIC-ADD ON     Status: Normal   Collection Time   11/27/11  8:25 AM      Component Value Range   Squamous Epithelial / LPF RARE  RARE   RBC / HPF 0-2  <3 RBC/hpf  CBC     Status: Abnormal   Collection Time   11/27/11  9:16 AM      Component Value Range   WBC 6.1  4.0 - 10.5 K/uL   RBC 3.37 (*) 3.87 - 5.11 MIL/uL   Hemoglobin 9.8  (*) 12.0 - 15.0 g/dL   HCT 64.3 (*) 32.9 - 51.8 %   MCV 86.4  78.0 - 100.0 fL   MCH 29.1  26.0 - 34.0 pg   MCHC 33.7  30.0 - 36.0 g/dL   RDW 84.1  66.0 - 63.0 %   Platelets 228  150 - 400 K/uL  DIFFERENTIAL     Status: Normal   Collection Time   11/27/11  9:16 AM      Component Value Range   Neutrophils Relative 57  43 - 77 %  Neutro Abs 3.5  1.7 - 7.7 K/uL   Lymphocytes Relative 33  12 - 46 %   Lymphs Abs 2.0  0.7 - 4.0 K/uL   Monocytes Relative 7  3 - 12 %   Monocytes Absolute 0.5  0.1 - 1.0 K/uL   Eosinophils Relative 2  0 - 5 %   Eosinophils Absolute 0.1  0.0 - 0.7 K/uL   Basophils Relative 1  0 - 1 %   Basophils Absolute 0.0  0.0 - 0.1 K/uL  COMPREHENSIVE METABOLIC PANEL     Status: Abnormal   Collection Time   11/27/11  9:16 AM      Component Value Range   Sodium 134 (*) 135 - 145 mEq/L   Potassium 4.0  3.5 - 5.1 mEq/L   Chloride 101  96 - 112 mEq/L   CO2 24  19 - 32 mEq/L   Glucose, Bld 81  70 - 99 mg/dL   BUN 5 (*) 6 - 23 mg/dL   Creatinine, Ser 1.61  0.50 - 1.10 mg/dL   Calcium 9.4  8.4 - 09.6 mg/dL   Total Protein 6.5  6.0 - 8.3 g/dL   Albumin 3.4 (*) 3.5 - 5.2 g/dL   AST 16  0 - 37 U/L   ALT 13  0 - 35 U/L   Alkaline Phosphatase 39  39 - 117 U/L   Total Bilirubin 0.1 (*) 0.3 - 1.2 mg/dL   GFR calc non Af Amer >90  >90 mL/min   GFR calc Af Amer >90  >90 mL/min  WET PREP, GENITAL     Status: Abnormal   Collection Time   11/27/11  9:36 AM      Component Value Range   Yeast Wet Prep HPF POC NONE SEEN  NONE SEEN   Trich, Wet Prep NONE SEEN  NONE SEEN   Clue Cells Wet Prep HPF POC NONE SEEN  NONE SEEN   WBC, Wet Prep HPF POC MODERATE (*) NONE SEEN   Assessment: Nausea and vomiting in pregnancy   Headache in pregnancy   Anemia  Plan:  IV hydration   Zofran 4 mg IV     ED Course: @ 10 15 am discussed with Dr. Senaida Ores, will continue IVF give benadryl and decadron and observe for improvement.   Procedures   12:15 pm Re evaluation: patient feeling much  better. Pain is 1/10, no nausea or vomiting. Taking po fluids and crackers without difficulty.  Rx Phenergan  Discussed with patient need for po hydration and frequent small meals.  D/C home to follow up in the office, return here as needed.  MDM

## 2011-11-27 NOTE — MAU Note (Signed)
Headache since yesterday. Has hx of HA's. Usually takes Ibuprofen or BC powders, but can not take now due to pregnancy. States Tylenol not helping. Also C/O cramping in lower abdomen. States she is being treated for UTI with Amoxicillin and has one more dose today. States she was told she has GBS in urine. States she feels she can not get over the UTI.

## 2011-11-28 LAB — GC/CHLAMYDIA PROBE AMP, GENITAL: Chlamydia, DNA Probe: NEGATIVE

## 2012-04-17 ENCOUNTER — Inpatient Hospital Stay (HOSPITAL_COMMUNITY)
Admission: AD | Admit: 2012-04-17 | Discharge: 2012-04-17 | Disposition: A | Discharge status: Home / Self Care | Payer: Medicaid Other | Source: Ambulatory Visit | Attending: Obstetrics and Gynecology | Admitting: Obstetrics and Gynecology

## 2012-04-17 DIAGNOSIS — O99891 Other specified diseases and conditions complicating pregnancy: Secondary | ICD-10-CM | POA: Insufficient documentation

## 2012-04-17 DIAGNOSIS — M549 Dorsalgia, unspecified: Secondary | ICD-10-CM | POA: Insufficient documentation

## 2012-04-17 LAB — URINALYSIS, ROUTINE W REFLEX MICROSCOPIC
Leukocytes, UA: NEGATIVE
Nitrite: NEGATIVE
Specific Gravity, Urine: 1.01 (ref 1.005–1.030)
pH: 7 (ref 5.0–8.0)

## 2012-04-17 LAB — WET PREP, GENITAL: Yeast Wet Prep HPF POC: NONE SEEN

## 2012-04-17 MED ORDER — CYCLOBENZAPRINE HCL 10 MG PO TABS: 5.0000 mg | ORAL_TABLET | Freq: Two times a day (BID) | ORAL | Status: DC | PRN

## 2012-04-17 NOTE — MAU Provider Note (Signed)
History     CSN: [id number]  Arrival date and time: 04/17/12 1654   First Provider Initiated Contact with Patient 04/17/12 1733      Chief Complaint  Patient presents with  . Back Pain   HPI MAUDIE SHINGLEDECKER is 26 y.o. [id number] [redacted]w[redacted]d weeks presenting with sharp back pain "that won't go away" that occurred Sunday and then again yesterday.    Moist underwear X 2 weeks.  Thinks she lost her mucous plug 2 weeks ago in the bathtub.  Called the office today and missed their return call.  Denies vaginal bleeding.  + for fetal movement.  Sees Dr. Galvin Proffer seen 2 days.  Did not "bother him with" sxs at that visit.  Denies complications with this pregnancy.  Denies dysuria but some irritation, itching on the labia.      Past Medical History  Diagnosis Date  . Anemia   . Headache   . Urinary tract infection   . Abnormal Pap smear   . Human papilloma virus     cells removed x2  . Chlamydia   . Trichomonas   . GERD (gastroesophageal reflux disease)   . Spinal headache   . Anxiety     Past Surgical History  Procedure Date  . Dilation and curettage of uterus   . Therapeutic abortion   . Pilonidal cyst excision   . Gynecologic cryosurgery     Family History  Problem Relation Age of Onset  . Hypertension Mother   . Fibroids Mother   . Cancer Maternal Grandfather     History  Substance Use Topics  . Smoking status: Current Every Day Smoker -- 0.2 packs/day for 14 years  . Smokeless tobacco: Never Used  . Alcohol Use: No     Comment: Stopped with preg.    Allergies: No Known Allergies  Prescriptions prior to admission  Medication Sig Dispense Refill  . acetaminophen (TYLENOL) 325 MG tablet Take 325 mg by mouth every 6 (six) hours as needed. For pain      . calcium carbonate (TUMS - DOSED IN MG ELEMENTAL CALCIUM) 500 MG chewable tablet Chew 1-2 tablets by mouth daily as needed. For acid reflux      . IRON PO Take 1 tablet by mouth at bedtime.      . Prenatal Vit-Fe  Fumarate-FA (PRENATAL MULTIVITAMIN) TABS Take 1 tablet by mouth at bedtime.         Review of Systems  Constitutional: Negative.   HENT: Negative.   Respiratory: Negative.   Cardiovascular: Negative.   Gastrointestinal: Negative for nausea, vomiting and abdominal pain.  Genitourinary:       Vaginal irritation.  Moist panties X 2 week ?lost plug  Musculoskeletal: Positive for back pain.   Physical Exam   Blood pressure 110/58, pulse 115, temperature 98.6 F (37 C), temperature source Oral, resp. rate 18, last menstrual period 08/28/2011.  Physical Exam  Constitutional: She is oriented to person, place, and time. She appears well-developed and well-nourished. No distress.  HENT:  Head: Normocephalic.  Neck: Normal range of motion.  Respiratory: Effort normal.  GI: Soft. There is no tenderness.  Genitourinary: No bleeding around the vagina. Vaginal discharge (white) found.       Cervical exam by Peace, RN ext os 1cm, internal os closed 50% posterior and soft.  No blood seen.  White discharge  Neurological: She is alert and oriented to person, place, and time.  Skin: Skin is warm and dry.  Psychiatric: She has  a normal mood and affect. Her behavior is normal.   Results for orders placed during the hospital encounter of 04/17/12 (from the past 24 hour(s))  URINALYSIS, ROUTINE W REFLEX MICROSCOPIC     Status: Normal   Collection Time   04/17/12  5:03 PM      Component Value Range   Color, Urine YELLOW  YELLOW   APPearance CLEAR  CLEAR   Specific Gravity, Urine 1.010  1.005 - 1.030   pH 7.0  5.0 - 8.0   Glucose, UA NEGATIVE  NEGATIVE mg/dL   Hgb urine dipstick NEGATIVE  NEGATIVE   Bilirubin Urine NEGATIVE  NEGATIVE   Ketones, ur NEGATIVE  NEGATIVE mg/dL   Protein, ur NEGATIVE  NEGATIVE mg/dL   Urobilinogen, UA 0.2  0.0 - 1.0 mg/dL   Nitrite NEGATIVE  NEGATIVE   Leukocytes, UA NEGATIVE  NEGATIVE  WET PREP, GENITAL     Status: Abnormal   Collection Time   04/17/12  6:40 PM       Component Value Range   Yeast Wet Prep HPF POC NONE SEEN  NONE SEEN   Trich, Wet Prep NONE SEEN  NONE SEEN   Clue Cells Wet Prep HPF POC NONE SEEN  NONE SEEN   WBC, Wet Prep HPF POC MANY (*) NONE SEEN    MAU Course  Procedures  Urine sent for GC/CHL pending  MDM 17:50  Call to Dr. Senaida Ores to report MSE and FMS--Baseline 150, mod variability, 1 possible variable, no contractions. She reviewed the strip too.  Order given to check cervix.  If closed can give either Flexeril or Vicodin for pain.   18:45  Peace RN reported the patient needed explanation of plan.  I went in and explained that I got her history, called Dr. Senaida Ores and orders were given.  She also told Peace she was concerned about her boyfriend.  Wet prep was collected and GC/CHL was added to her urine.  Discussed that Dr. Senaida Ores gave a choice of a muscle relaxer (Flexeril) or pain med (Vicodin) for discomfort.  She described the pain again as one sided that comes and goes.  Suggested Flexeril.  She agreed with choice.   19:10  FMS was reactive  Assessment and Plan  A:  Back ache at [redacted]w[redacted]d gestation     Neg UA, Wet prep  P:  Patient did not want to take medication in the hospital but asked for a Rx.  Rx for Flexeril 5mg  bid X 3days prn  #6      Report worsening sxs, vaginal bleeding, loss of fluid, or decreased fetal movement     Keep scheduled appointment  Ridhi Hoffert,EVE M 04/17/2012, 7:09 PM

## 2012-04-17 NOTE — MAU Note (Signed)
Patient is in with c/o constant lower back pain since Sunday (she states that it have gotten worse today), she also c/o intermittent cramping and white, thick nonodorous discharge. She denies vaginal bleeding or lof. She reports good fetal movement.

## 2012-04-17 NOTE — MAU Note (Signed)
Frequent low back pain recently, has been increasing yesterday. Denies bleeding, ? D/c- panties have been damp.

## 2012-04-18 LAB — GC/CHLAMYDIA PROBE AMP: GC Probe RNA: NEGATIVE

## 2012-05-17 ENCOUNTER — Encounter (HOSPITAL_COMMUNITY): Payer: Self-pay

## 2012-05-17 ENCOUNTER — Inpatient Hospital Stay (HOSPITAL_COMMUNITY)
Admission: AD | Admit: 2012-05-17 | Discharge: 2012-05-17 | Disposition: A | Discharge status: Home / Self Care | Payer: Medicaid Other | Source: Ambulatory Visit | Attending: Obstetrics and Gynecology | Admitting: Obstetrics and Gynecology

## 2012-05-17 DIAGNOSIS — R51 Headache: Secondary | ICD-10-CM | POA: Insufficient documentation

## 2012-05-17 DIAGNOSIS — O99891 Other specified diseases and conditions complicating pregnancy: Secondary | ICD-10-CM | POA: Insufficient documentation

## 2012-05-17 DIAGNOSIS — K089 Disorder of teeth and supporting structures, unspecified: Secondary | ICD-10-CM | POA: Insufficient documentation

## 2012-05-17 DIAGNOSIS — K047 Periapical abscess without sinus: Secondary | ICD-10-CM | POA: Insufficient documentation

## 2012-05-17 LAB — CBC
HCT: 29.5 % — ABNORMAL LOW (ref 36.0–46.0)
Hemoglobin: 9.8 g/dL — ABNORMAL LOW (ref 12.0–15.0)
MCH: 28.4 pg (ref 26.0–34.0)
MCHC: 33.2 g/dL (ref 30.0–36.0)

## 2012-05-17 LAB — URINALYSIS, ROUTINE W REFLEX MICROSCOPIC
Bilirubin Urine: NEGATIVE
Glucose, UA: NEGATIVE mg/dL
Ketones, ur: NEGATIVE mg/dL
pH: 5.5 (ref 5.0–8.0)

## 2012-05-17 MED ORDER — ONDANSETRON 8 MG PO TBDP
8.0000 mg | ORAL_TABLET | Freq: Once | ORAL | Status: AC
  Administered 2012-05-17: 8 mg via ORAL
  Filled 2012-05-17: qty 1

## 2012-05-17 MED ORDER — MORPHINE SULFATE 10 MG/ML IJ SOLN
10.0000 mg | Freq: Once | INTRAMUSCULAR | Status: AC
  Administered 2012-05-17: 10 mg via INTRAMUSCULAR
  Filled 2012-05-17: qty 1

## 2012-05-17 MED ORDER — HYDROMORPHONE HCL 2 MG PO TABS
2.0000 mg | ORAL_TABLET | Freq: Once | ORAL | Status: AC
  Administered 2012-05-17: 2 mg via ORAL
  Filled 2012-05-17: qty 1

## 2012-05-17 NOTE — Progress Notes (Signed)
Dr Jackelyn Knife notified of patient, her complaints of teeth ache, headache, pelvic pressure and back pain, tracing, and sve result. Order to administer 2mg  dilaudid po once and 8mg  odt zofran. Call back with update.

## 2012-05-17 NOTE — MAU Provider Note (Signed)
History     CSN: [id number]  Arrival date and time: 05/17/12 0145   First Provider Initiated Contact with Patient 05/17/12 0402      Chief Complaint  Patient presents with  . Abdominal Pain  . Headache   HPI  Ashley Cole is a 26 y.o. [phone] at [redacted]w[redacted]d she presents today with tooth, jaw and head pain. She rates the pain 10/10, and states that she has had it for 3 days. It is not any worse than it has been, however she has not slept for 3 nights. She states she is just tired and can't tolerate the pain any more. She had the tooth pulled on 05/16/12 and has been on an antibiotic for 2 days.   Past Medical History  Diagnosis Date  . Anemia   . Headache   . Urinary tract infection   . Abnormal Pap smear   . Human papilloma virus     cells removed x2  . Chlamydia   . Trichomonas   . GERD (gastroesophageal reflux disease)   . Spinal headache   . Anxiety     Past Surgical History  Procedure Date  . Dilation and curettage of uterus   . Therapeutic abortion   . Pilonidal cyst excision   . Gynecologic cryosurgery     Family History  Problem Relation Age of Onset  . Hypertension Mother   . Fibroids Mother   . Cancer Maternal Grandfather     History  Substance Use Topics  . Smoking status: Current Every Day Smoker -- 0.2 packs/day for 14 years  . Smokeless tobacco: Never Used  . Alcohol Use: No     Comment: Stopped with preg.    Allergies: No Known Allergies  Prescriptions prior to admission  Medication Sig Dispense Refill  . acetaminophen (TYLENOL) 325 MG tablet Take 325 mg by mouth every 6 (six) hours as needed. For pain      . calcium carbonate (TUMS - DOSED IN MG ELEMENTAL CALCIUM) 500 MG chewable tablet Chew 1-2 tablets by mouth daily as needed. For acid reflux      . cyclobenzaprine (FLEXERIL) 10 MG tablet Take 0.5 tablets (5 mg total) by mouth 2 (two) times daily as needed for muscle spasms.  6 tablet  0  . IRON PO Take 1 tablet by mouth at bedtime.       . Prenatal Vit-Fe Fumarate-FA (PRENATAL MULTIVITAMIN) TABS Take 1 tablet by mouth at bedtime.         Review of Systems  Constitutional: Negative for fever and chills.  Eyes: Negative for blurred vision.  Gastrointestinal: Negative for nausea, vomiting, abdominal pain, diarrhea and constipation.  Neurological: Negative for headaches.   Physical Exam   Blood pressure 127/73, pulse 104, temperature 97.8 F (36.6 C), temperature source Oral, resp. rate 18, height 5\' 4"  (1.626 m), weight 62.596 kg (138 lb), last menstrual period 08/28/2011.  Physical Exam  Nursing note and vitals reviewed. Constitutional: She is oriented to person, place, and time. She appears well-developed and well-nourished. No distress (tearful ).  HENT:  Head: Normocephalic.  Cardiovascular: Normal rate.   Respiratory: Effort normal.  GI: Soft.  Genitourinary:        FHT: 145, moderate with 15x15 accels, no decels Toco: q 5-6 min contractions. Pt is not noticing the contractions.   Neurological: She is alert and oriented to person, place, and time.  Skin: Skin is warm and dry.  Psychiatric: She has a normal mood and affect.  MAU Course  Procedures  RN has been in contact with the patient's OBGYN and Dentist Plan to give IM morphine at this time, and obtain CBC 0500: Pt reports adequate relief from morphine RN contacted Dr. Jackelyn Knife   Assessment and Plan  Abscessed tooth  FU with dentist   Tawnya Crook 05/17/2012, 4:06 AM

## 2012-05-17 NOTE — MAU Note (Signed)
Patient is in with c/o headache, pressure, lot of pain. Patient states that she went to the dentist yesterday and a tooth extracted. She denies any vaginal bleeding, lof. She reports fetal movement.

## 2012-05-17 NOTE — Progress Notes (Signed)
Dr. Jackelyn Knife notified that patient states that she is feeling better and cbc result. Order to discharge patient home and encourage her to take tylenol#3 as directed.

## 2012-05-17 NOTE — Progress Notes (Signed)
Dr Jackelyn Knife notified that patient's dentist (dr Shea Evans) states that it is normal for patient to have post-op toothache from the decayed tooth extraction But should be relieved with OTC tylenol. Dr Jackelyn Knife is aware that patient have no pain relief from the dilaudid. Order to either administer 2mg  po dilaudid or 10mg  im morphine and CBC. Call with results.

## 2012-05-18 NOTE — Progress Notes (Signed)
FHT from 12-14 reviewed.  Reactive NST, irreg ctx.

## 2012-05-29 ENCOUNTER — Inpatient Hospital Stay (HOSPITAL_COMMUNITY)
Admission: AD | Admit: 2012-05-29 | Discharge: 2012-06-01 | DRG: 775 | Disposition: A | Discharge status: Home / Self Care | Payer: Medicaid Other | Source: Ambulatory Visit | Attending: Obstetrics and Gynecology | Admitting: Obstetrics and Gynecology

## 2012-05-29 ENCOUNTER — Encounter (HOSPITAL_COMMUNITY): Payer: Self-pay | Admitting: *Deleted

## 2012-05-29 DIAGNOSIS — O99892 Other specified diseases and conditions complicating childbirth: Principal | ICD-10-CM | POA: Diagnosis present

## 2012-05-29 DIAGNOSIS — Z22330 Carrier of Group B streptococcus: Secondary | ICD-10-CM

## 2012-05-29 NOTE — MAU Note (Signed)
Pt states she has been having contractions all day. Pt states contractions became stronger at 2100.

## 2012-05-30 ENCOUNTER — Encounter (HOSPITAL_COMMUNITY): Payer: Self-pay | Admitting: Anesthesiology

## 2012-05-30 ENCOUNTER — Encounter (HOSPITAL_COMMUNITY): Payer: Self-pay | Admitting: *Deleted

## 2012-05-30 ENCOUNTER — Inpatient Hospital Stay (HOSPITAL_COMMUNITY): Payer: Medicaid Other | Admitting: Anesthesiology

## 2012-05-30 LAB — CBC
HCT: 31.9 % — ABNORMAL LOW (ref 36.0–46.0)
Hemoglobin: 10.6 g/dL — ABNORMAL LOW (ref 12.0–15.0)
MCH: 28.4 pg (ref 26.0–34.0)
RBC: 3.73 MIL/uL — ABNORMAL LOW (ref 3.87–5.11)

## 2012-05-30 LAB — OB RESULTS CONSOLE GBS: GBS: POSITIVE

## 2012-05-30 LAB — OB RESULTS CONSOLE ABO/RH: RH Type: POSITIVE

## 2012-05-30 LAB — OB RESULTS CONSOLE GC/CHLAMYDIA: Chlamydia: NEGATIVE

## 2012-05-30 LAB — OB RESULTS CONSOLE RUBELLA ANTIBODY, IGM: Rubella: IMMUNE

## 2012-05-30 LAB — OB RESULTS CONSOLE HIV ANTIBODY (ROUTINE TESTING): HIV: NONREACTIVE

## 2012-05-30 LAB — RPR: RPR Ser Ql: NONREACTIVE

## 2012-05-30 MED ORDER — OXYCODONE-ACETAMINOPHEN 5-325 MG PO TABS: 1.0000 | ORAL_TABLET | ORAL | Status: DC | PRN

## 2012-05-30 MED ORDER — ONDANSETRON HCL 4 MG/2ML IJ SOLN: 4.0000 mg | INTRAMUSCULAR | Status: DC | PRN

## 2012-05-30 MED ORDER — OXYTOCIN 40 UNITS IN LACTATED RINGERS INFUSION - SIMPLE MED
62.5000 mL/h | INTRAVENOUS | Status: DC
  Filled 2012-05-30: qty 1000

## 2012-05-30 MED ORDER — SENNOSIDES-DOCUSATE SODIUM 8.6-50 MG PO TABS
2.0000 | ORAL_TABLET | Freq: Every day | ORAL | Status: DC
  Administered 2012-05-30 – 2012-05-31 (×2): 2 via ORAL

## 2012-05-30 MED ORDER — SODIUM CHLORIDE 0.9 % IV SOLN
2.0000 g | Freq: Four times a day (QID) | INTRAVENOUS | Status: DC
  Administered 2012-05-30: 2 g via INTRAVENOUS
  Filled 2012-05-30 (×3): qty 2000

## 2012-05-30 MED ORDER — DIPHENHYDRAMINE HCL 25 MG PO CAPS: 25.0000 mg | ORAL_CAPSULE | Freq: Four times a day (QID) | ORAL | Status: DC | PRN

## 2012-05-30 MED ORDER — OXYTOCIN BOLUS FROM INFUSION
500.0000 mL | INTRAVENOUS | Status: DC
  Administered 2012-05-30: 500 mL via INTRAVENOUS

## 2012-05-30 MED ORDER — TETANUS-DIPHTH-ACELL PERTUSSIS 5-2.5-18.5 LF-MCG/0.5 IM SUSP: 0.5000 mL | Freq: Once | INTRAMUSCULAR | Status: DC

## 2012-05-30 MED ORDER — LACTATED RINGERS IV SOLN
INTRAVENOUS | Status: DC
  Administered 2012-05-30: 01:00:00 via INTRAVENOUS

## 2012-05-30 MED ORDER — OXYCODONE-ACETAMINOPHEN 5-325 MG PO TABS
1.0000 | ORAL_TABLET | ORAL | Status: DC | PRN
  Administered 2012-05-30 (×2): 1 via ORAL
  Filled 2012-05-30 (×2): qty 1

## 2012-05-30 MED ORDER — SIMETHICONE 80 MG PO CHEW: 80.0000 mg | CHEWABLE_TABLET | ORAL | Status: DC | PRN

## 2012-05-30 MED ORDER — LACTATED RINGERS IV SOLN: 500.0000 mL | INTRAVENOUS | Status: DC | PRN

## 2012-05-30 MED ORDER — LACTATED RINGERS IV SOLN: 500.0000 mL | Freq: Once | INTRAVENOUS | Status: DC

## 2012-05-30 MED ORDER — FLEET ENEMA 7-19 GM/118ML RE ENEM: 1.0000 | ENEMA | RECTAL | Status: DC | PRN

## 2012-05-30 MED ORDER — PRENATAL MULTIVITAMIN CH
1.0000 | ORAL_TABLET | Freq: Every day | ORAL | Status: DC
  Administered 2012-05-31: 1 via ORAL
  Filled 2012-05-30 (×2): qty 1

## 2012-05-30 MED ORDER — LIDOCAINE HCL (PF) 1 % IJ SOLN
30.0000 mL | INTRAMUSCULAR | Status: DC | PRN
  Filled 2012-05-30: qty 30

## 2012-05-30 MED ORDER — DIBUCAINE 1 % RE OINT: 1.0000 "application " | TOPICAL_OINTMENT | RECTAL | Status: DC | PRN

## 2012-05-30 MED ORDER — CITRIC ACID-SODIUM CITRATE 334-500 MG/5ML PO SOLN: 30.0000 mL | ORAL | Status: DC | PRN

## 2012-05-30 MED ORDER — ZOLPIDEM TARTRATE 5 MG PO TABS: 5.0000 mg | ORAL_TABLET | Freq: Every evening | ORAL | Status: DC | PRN

## 2012-05-30 MED ORDER — MEASLES, MUMPS & RUBELLA VAC ~~LOC~~ INJ
0.5000 mL | INJECTION | Freq: Once | SUBCUTANEOUS | Status: DC
  Filled 2012-05-30: qty 0.5

## 2012-05-30 MED ORDER — EPHEDRINE 5 MG/ML INJ: 10.0000 mg | INTRAVENOUS | Status: DC | PRN

## 2012-05-30 MED ORDER — ONDANSETRON HCL 4 MG PO TABS: 4.0000 mg | ORAL_TABLET | ORAL | Status: DC | PRN

## 2012-05-30 MED ORDER — PRENATAL MULTIVITAMIN CH: 1.0000 | ORAL_TABLET | Freq: Every day | ORAL | Status: DC

## 2012-05-30 MED ORDER — BENZOCAINE-MENTHOL 20-0.5 % EX AERO: 1.0000 "application " | INHALATION_SPRAY | CUTANEOUS | Status: DC | PRN

## 2012-05-30 MED ORDER — IBUPROFEN 600 MG PO TABS
600.0000 mg | ORAL_TABLET | Freq: Four times a day (QID) | ORAL | Status: DC
  Administered 2012-05-30 – 2012-06-01 (×9): 600 mg via ORAL
  Filled 2012-05-30 (×9): qty 1

## 2012-05-30 MED ORDER — LANOLIN HYDROUS EX OINT: TOPICAL_OINTMENT | CUTANEOUS | Status: DC | PRN

## 2012-05-30 MED ORDER — WITCH HAZEL-GLYCERIN EX PADS: 1.0000 "application " | MEDICATED_PAD | CUTANEOUS | Status: DC | PRN

## 2012-05-30 MED ORDER — LIDOCAINE HCL (PF) 1 % IJ SOLN
INTRAMUSCULAR | Status: DC | PRN
  Administered 2012-05-30 (×4): 4 mL

## 2012-05-30 MED ORDER — EPHEDRINE 5 MG/ML INJ
10.0000 mg | INTRAVENOUS | Status: DC | PRN
  Filled 2012-05-30: qty 4

## 2012-05-30 MED ORDER — ONDANSETRON HCL 4 MG/2ML IJ SOLN: 4.0000 mg | Freq: Four times a day (QID) | INTRAMUSCULAR | Status: DC | PRN

## 2012-05-30 MED ORDER — LACTATED RINGERS IV SOLN
INTRAVENOUS | Status: AC
  Administered 2012-05-30: 06:00:00 via INTRAVENOUS

## 2012-05-30 MED ORDER — FENTANYL 2.5 MCG/ML BUPIVACAINE 1/10 % EPIDURAL INFUSION (WH - ANES)
14.0000 mL/h | INTRAMUSCULAR | Status: DC
  Administered 2012-05-30: 14 mL/h via EPIDURAL
  Filled 2012-05-30: qty 125

## 2012-05-30 MED ORDER — PHENYLEPHRINE 40 MCG/ML (10ML) SYRINGE FOR IV PUSH (FOR BLOOD PRESSURE SUPPORT)
80.0000 ug | PREFILLED_SYRINGE | INTRAVENOUS | Status: DC | PRN
  Filled 2012-05-30: qty 5

## 2012-05-30 MED ORDER — IBUPROFEN 600 MG PO TABS: 600.0000 mg | ORAL_TABLET | Freq: Four times a day (QID) | ORAL | Status: DC | PRN

## 2012-05-30 MED ORDER — FERROUS SULFATE 325 (65 FE) MG PO TABS
325.0000 mg | ORAL_TABLET | Freq: Two times a day (BID) | ORAL | Status: DC
  Administered 2012-05-30 – 2012-06-01 (×4): 325 mg via ORAL
  Filled 2012-05-30 (×4): qty 1

## 2012-05-30 MED ORDER — DIPHENHYDRAMINE HCL 50 MG/ML IJ SOLN: 12.5000 mg | INTRAMUSCULAR | Status: DC | PRN

## 2012-05-30 MED ORDER — PHENYLEPHRINE 40 MCG/ML (10ML) SYRINGE FOR IV PUSH (FOR BLOOD PRESSURE SUPPORT): 80.0000 ug | PREFILLED_SYRINGE | INTRAVENOUS | Status: DC | PRN

## 2012-05-30 MED ORDER — OXYTOCIN 40 UNITS IN LACTATED RINGERS INFUSION - SIMPLE MED: 62.5000 mL/h | INTRAVENOUS | Status: AC | PRN

## 2012-05-30 MED ORDER — ACETAMINOPHEN 325 MG PO TABS: 650.0000 mg | ORAL_TABLET | ORAL | Status: DC | PRN

## 2012-05-30 NOTE — Anesthesia Procedure Notes (Signed)
Epidural Patient location during procedure: OB Start time: 05/30/2012 1:10 AM  Staffing Performed by: anesthesiologist   Preanesthetic Checklist Completed: patient identified, site marked, surgical consent, pre-op evaluation, timeout performed, IV checked, risks and benefits discussed and monitors and equipment checked  Epidural Patient position: sitting Prep: site prepped and draped and DuraPrep Patient monitoring: continuous pulse ox and blood pressure Approach: midline Injection technique: LOR air  Needle:  Needle type: Tuohy  Needle gauge: 17 G Needle length: 9 cm and 9 Needle insertion depth: 4 cm Catheter type: closed end flexible Catheter size: 19 Gauge Catheter at skin depth: 9 cm Test dose: negative  Assessment Events: blood not aspirated, injection not painful, no injection resistance, negative IV test and no paresthesia  Additional Notes Discussed risk of headache, infection, bleeding, nerve injury and failed or incomplete block.  Patient voices understanding and wishes to proceed.  Epidural placed easily on first attempt.  Patient tolerated procedure well, no apparent complications.  Jasmine December, MDReason for block:procedure for pain

## 2012-05-30 NOTE — Anesthesia Postprocedure Evaluation (Signed)
  Anesthesia Post-op Note  Patient: Ashley Cole  Procedure(s) Performed: * No procedures listed *  Patient Location: Mother/Baby  Anesthesia Type:Epidural  Level of Consciousness: awake, alert  and oriented  Airway and Oxygen Therapy: Patient Spontanous Breathing  Post-op Pain: mild  Post-op Assessment: Patient's Cardiovascular Status Stable, Respiratory Function Stable, Patent Airway, No signs of Nausea or vomiting and Pain level controlled  Post-op Vital Signs: stable  Complications: No apparent anesthesia complications

## 2012-05-30 NOTE — Progress Notes (Signed)
Dr Ambrose Mantle notified of SVE and pt urge to push.  Orders given to begin pushing and call for delivery.

## 2012-05-30 NOTE — H&P (Signed)
NAMEKEYIRA, MONDESIR               ACCOUNT NO.:  [id number]  MEDICAL RECORD NO.:  [id number]  LOCATION:  9167                          FACILITY:  [facility]  PHYSICIAN:  Malachi Pro. Ambrose Mantle, M.D. DATE OF BIRTH:  [birth year]  DATE OF ADMISSION:  05/29/2012 DATE OF DISCHARGE:                             HISTORY & PHYSICAL   PRESENT ILLNESS:  This is a 26 year old black female, para 2-0-5-2, gravida 51, [facility] June 01, 2012, by an ultrasound at 7 weeks and 5 days done on Oct 19, 2011.  The patient comes in to Maternity Admission Unit complaining of contractions.  She was evaluated in maternity admission and observed for an 1 hour.  Cervix changed in 2-3 cm to 4 cm, and she was admitted.  Blood group and type A positive, negative antibody.  Pap smear, low-grade SIL.  Rubella immune.  RPR nonreactive.  Urine culture positive for Klebsiella.  Hepatitis B surface antigen negative, HIV negative, urine culture, later positive for group B strep, hemoglobin AA, GC and Chlamydia negative, sickle cell screen negative.  Cystic fibrosis screen negative.  First trimester screen negative.  AFP negative.  One hour Glucola 107, group B strep positive.  The patient began her prenatal course in our office on November 16, 2011, at 11 weeks and 5 days, except for multiple complaints including leaking fluid, frequent contractions.  She had a relatively uncomplicated prenatal course.  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  PAST SURGICAL HISTORY:  Pilonidal cyst excision in 2007, therapeutic abortions in 2001, 2003, 2004, 2006 x2.  ALLERGIES:  She has no known drug allergies.  FAMILY HISTORY:  A brother has cerebral palsy.  Mother was said to have cervical cancer as well as a maternal grandmother and aunt.  Brother with hydrocephaly.  Mother with hypertension.  The patient's 2  vaginal deliveries were in 2002 and 2007, 6 pounds 7 ounces and 6 pounds 0 ounces.  The patient states that she is active with no formal exercise. Denies alcohol, illicit substance abuse.  Said she smoked occasionally.  PHYSICAL EXAMINATION:  On admission, her blood pressure was 141/86, pulse was 128, respirations 22, heart normal size and sounds. Tachycardia.  Lungs clear to auscultation.  Fundal height at her last recorded prenatal visit was 36 cm per the RN.  In the MAU, the cervix is 4 cm, 100%, vertex presentation.  ADMITTING IMPRESSION:  Intrauterine pregnancy 39 weeks and 5 days, positive group B strep, active labor.  The patient is admitted and will be placed on ampicillin for more rapid infusion.    Malachi Pro. Ambrose Mantle, M.D.    TFH/MEDQ  D:  05/30/2012  T:  05/30/2012  Job:  [id number]

## 2012-05-30 NOTE — Anesthesia Preprocedure Evaluation (Signed)
Anesthesia Evaluation  Patient identified by MRN, date of birth, ID band Patient awake    Reviewed: Allergy & Precautions, H&P , NPO status , Patient's Chart, lab work & pertinent test results, reviewed documented beta blocker date and time   History of Anesthesia Complications (+) POST - OP SPINAL HEADACHE  Airway Mallampati: III TM Distance: >3 FB Neck ROM: full    Dental  (+) Teeth Intact   Pulmonary Current Smoker,  breath sounds clear to auscultation        Cardiovascular negative cardio ROS  Rhythm:regular Rate:Normal     Neuro/Psych  Headaches, negative psych ROS   GI/Hepatic Neg liver ROS, GERD-  ,  Endo/Other  negative endocrine ROS  Renal/GU negative Renal ROS  negative genitourinary   Musculoskeletal   Abdominal   Peds  Hematology  (+) anemia ,   Anesthesia Other Findings   Reproductive/Obstetrics (+) Pregnancy                           Anesthesia Physical Anesthesia Plan  ASA: II  Anesthesia Plan: Epidural   Post-op Pain Management:    Induction:   Airway Management Planned:   Additional Equipment:   Intra-op Plan:   Post-operative Plan:   Informed Consent: I have reviewed the patients History and Physical, chart, labs and discussed the procedure including the risks, benefits and alternatives for the proposed anesthesia with the patient or authorized representative who has indicated his/her understanding and acceptance.     Plan Discussed with:   Anesthesia Plan Comments:         Anesthesia Quick Evaluation

## 2012-05-30 NOTE — Progress Notes (Signed)
Patient ID: Ashley Cole, female   DOB: Sep 07, 1985, 26 y.o.   MRN: [id number] Delivery note:  The progressed to full dilatation and pushed well. She delivered a living female infant spontaneously ROA over an intact perineum Apgars 9 and 9 at 1 and 5 minutes. The placenta delivered intact and the uterus was normal. There were no lacerations. EBL 300 cc's.

## 2012-05-30 NOTE — Progress Notes (Signed)
UR chart review completed.  

## 2012-05-30 NOTE — Progress Notes (Signed)
Patient ID: Ashley Cole, female   DOB: 08/07/1985, 26 y.o.   MRN: [id number] Pt has received ampicillin and an epidural.The cervix is 5-6 cm 90% effaced and the vertex is at - 2 station. AROM produced clear fluid.

## 2012-05-30 NOTE — Progress Notes (Signed)
Patient ID: Ashley Cole, female   DOB: May 04, 1986, 26 y.o.   MRN: [id number] DOD VS stable no complaints

## 2012-05-31 LAB — CBC
HCT: 27.9 % — ABNORMAL LOW (ref 36.0–46.0)
Hemoglobin: 9.1 g/dL — ABNORMAL LOW (ref 12.0–15.0)
MCHC: 32.6 g/dL (ref 30.0–36.0)
WBC: 6.9 10*3/uL (ref 4.0–10.5)

## 2012-05-31 NOTE — Progress Notes (Signed)
Patient was referred for history of depression/anxiety.  * Referral screened out by Clinical Social Worker because none of the following criteria appear to apply:  ~ History of anxiety/depression during this pregnancy, or of post-partum depression.  ~ Diagnosis of anxiety and/or depression within last 3 years  ~ History of depression due to pregnancy loss/loss of child  OR  * Patient's symptoms currently being treated with medication and/or therapy.  Please contact the Clinical Social Worker if needs arise, or by the patient's request.  Pt was never diagnosed with anxiety disorder. She admits to having a panic attack if "upset," but states she is an to manage well. She denies history of depression, SI or HI. FOB at the bedside and supportive. She has all the necessary supplies for the infant and appears appropriate.

## 2012-05-31 NOTE — Discharge Summary (Signed)
Obstetric Discharge Summary Reason for Admission: onset of labor Prenatal Procedures: none Intrapartum Procedures: spontaneous vaginal delivery Postpartum Procedures: none Complications-Operative and Postpartum: none Hemoglobin  Date Value Range Status  05/31/2012 9.1* 12.0 - 15.0 g/dL Final     HCT  Date Value Range Status  05/31/2012 27.9* 36.0 - 46.0 % Final    Physical Exam:  General: alert and cooperative Lochia: appropriate Uterine Fundus: firm  Discharge Diagnoses: Term Pregnancy-delivered  Discharge Information: Date: 06/01/2012 Activity: pelvic rest Diet: routine Medications: Ibuprofen Condition: improved Instructions: refer to practice specific booklet Discharge to: home Follow-up Information    Follow up with Bing Plume, MD. Schedule an appointment as soon as possible for a visit in 6 weeks. (Also call to set up circumcision this week)    Contact information:    [address], [address] [place] [address] [phone]          Newborn Data: Live born female  Birth Weight: 6 lb 7.5 oz (2935 g) APGAR: 9, 9  Home with mother.  Ashley Cole 06/01/2012, 9:36 AM

## 2012-05-31 NOTE — Progress Notes (Signed)
Post Partum Day 1 Subjective: no complaints and tolerating PO  Objective: Blood pressure 107/66, pulse 88, temperature 98 F (36.7 C), temperature source Oral, resp. rate 18, height 5' (1.524 m), weight 64.864 kg (143 lb), last menstrual period 08/28/2011, SpO2 99.00%, unknown if currently breastfeeding.  Physical Exam:  General: alert and cooperative Lochia: appropriate Uterine Fundus: firm    Basename 05/31/12 0630 05/30/12 0030  HGB 9.1* 10.6*  HCT 27.9* 31.9*    Assessment/Plan: Plan for discharge tomorrow  Peds would like baby to stay one more day.  LOS: 2 days   Janet Humphreys W 05/31/2012, 10:07 AM

## 2012-06-01 MED ORDER — IBUPROFEN 600 MG PO TABS: 600.0000 mg | ORAL_TABLET | Freq: Four times a day (QID) | ORAL | Status: DC

## 2012-06-01 NOTE — Progress Notes (Signed)
Post Partum Day 2 Subjective: no complaints and tolerating PO  Objective: Blood pressure 101/53, pulse 85, temperature 98 F (36.7 C), temperature source Oral, resp. rate 20, height 5' (1.524 m), weight 64.864 kg (143 lb), last menstrual period 08/28/2011, SpO2 99.00%, unknown if currently breastfeeding.  Physical Exam:  General: alert and cooperative Lochia: appropriate Uterine Fundus: firm    Basename 05/31/12 0630 05/30/12 0030  HGB 9.1* 10.6*  HCT 27.9* 31.9*    Assessment/Plan: Discharge home Motrin To call to schedule circumcision in office   LOS: 3 days   Kabria Hetzer W 06/01/2012, 9:33 AM

## 2012-09-02 ENCOUNTER — Encounter (HOSPITAL_BASED_OUTPATIENT_CLINIC_OR_DEPARTMENT_OTHER): Payer: Self-pay | Admitting: *Deleted

## 2012-09-02 ENCOUNTER — Emergency Department (HOSPITAL_BASED_OUTPATIENT_CLINIC_OR_DEPARTMENT_OTHER)
Admission: EM | Admit: 2012-09-02 | Discharge: 2012-09-02 | Disposition: A | Discharge status: Home / Self Care | Payer: Medicaid Other | Attending: Emergency Medicine | Admitting: Emergency Medicine

## 2012-09-02 DIAGNOSIS — F17200 Nicotine dependence, unspecified, uncomplicated: Secondary | ICD-10-CM | POA: Insufficient documentation

## 2012-09-02 DIAGNOSIS — Z8679 Personal history of other diseases of the circulatory system: Secondary | ICD-10-CM | POA: Insufficient documentation

## 2012-09-02 DIAGNOSIS — Z79899 Other long term (current) drug therapy: Secondary | ICD-10-CM | POA: Insufficient documentation

## 2012-09-02 DIAGNOSIS — Z8619 Personal history of other infectious and parasitic diseases: Secondary | ICD-10-CM | POA: Insufficient documentation

## 2012-09-02 DIAGNOSIS — Z862 Personal history of diseases of the blood and blood-forming organs and certain disorders involving the immune mechanism: Secondary | ICD-10-CM | POA: Insufficient documentation

## 2012-09-02 DIAGNOSIS — N898 Other specified noninflammatory disorders of vagina: Secondary | ICD-10-CM

## 2012-09-02 DIAGNOSIS — Z8659 Personal history of other mental and behavioral disorders: Secondary | ICD-10-CM | POA: Insufficient documentation

## 2012-09-02 DIAGNOSIS — Z87440 Personal history of urinary (tract) infections: Secondary | ICD-10-CM | POA: Insufficient documentation

## 2012-09-02 DIAGNOSIS — Z9889 Other specified postprocedural states: Secondary | ICD-10-CM | POA: Insufficient documentation

## 2012-09-02 DIAGNOSIS — Z8719 Personal history of other diseases of the digestive system: Secondary | ICD-10-CM | POA: Insufficient documentation

## 2012-09-02 DIAGNOSIS — Z3202 Encounter for pregnancy test, result negative: Secondary | ICD-10-CM | POA: Insufficient documentation

## 2012-09-02 DIAGNOSIS — N899 Noninflammatory disorder of vagina, unspecified: Secondary | ICD-10-CM | POA: Insufficient documentation

## 2012-09-02 LAB — WET PREP, GENITAL
Trich, Wet Prep: NONE SEEN
Yeast Wet Prep HPF POC: NONE SEEN

## 2012-09-02 LAB — URINE MICROSCOPIC-ADD ON

## 2012-09-02 LAB — URINALYSIS, ROUTINE W REFLEX MICROSCOPIC
Glucose, UA: NEGATIVE mg/dL
Specific Gravity, Urine: 1.025 (ref 1.005–1.030)

## 2012-09-02 LAB — PREGNANCY, URINE: Preg Test, Ur: NEGATIVE

## 2012-09-02 NOTE — ED Provider Notes (Signed)
Medical screening examination/treatment/procedure(s) were performed by non-physician practitioner and as supervising physician I was immediately available for consultation/collaboration.  Doug Sou, MD 09/02/12 [phone]

## 2012-09-02 NOTE — ED Notes (Signed)
Patient states she has had vaginal discharge, itching and irritation for the last 3 days.  Was seen by her PCP yesterday and tested for BV, yeast and trich which were negative, as well as a negative pregnancy test.  States she is not feeling any better and wants to be tested for std's.

## 2012-09-02 NOTE — ED Provider Notes (Signed)
History     CSN: [id number]  Arrival date & time 09/02/12  1212   First MD Initiated Contact with Patient 09/02/12 1222      Chief Complaint  Patient presents with  . Vaginal Discharge    (Consider location/radiation/quality/duration/timing/severity/associated sxs/prior treatment) HPI Comments: Pt states that she had had white vaginal discharge and irritation  Patient is a 27 y.o. female presenting with vaginal discharge. The history is provided by the patient. No language interpreter was used.  Vaginal Discharge This is a new problem. The current episode started in the past 7 days. The problem occurs constantly. The problem has been unchanged. Pertinent negatives include no abdominal pain, fever or vomiting. Nothing aggravates the symptoms. She has tried nothing for the symptoms.    Past Medical History  Diagnosis Date  . Anemia   . Headache   . Urinary tract infection   . Abnormal Pap smear   . Human papilloma virus     cells removed x2  . Chlamydia   . Trichomonas   . GERD (gastroesophageal reflux disease)   . Spinal headache   . Anxiety     Past Surgical History  Procedure Laterality Date  . Dilation and curettage of uterus    . Therapeutic abortion    . Pilonidal cyst excision    . Gynecologic cryosurgery      Family History  Problem Relation Age of Onset  . Hypertension Mother   . Fibroids Mother   . Cancer Maternal Grandfather     History  Substance Use Topics  . Smoking status: Current Every Day Smoker -- 0.25 packs/day for 14 years  . Smokeless tobacco: Never Used  . Alcohol Use: Yes     Comment: Stopped with preg./ socially    OB History   Grav Para Term Preterm Abortions TAB SAB Ect Mult Living   7 3 3  0 4 4 0 0 0 3      Review of Systems  Constitutional: Negative for fever.  Respiratory: Negative.   Cardiovascular: Negative.   Gastrointestinal: Negative for vomiting and abdominal pain.  Genitourinary: Positive for vaginal discharge.     Allergies  Review of patient's allergies indicates no known allergies.  Home Medications   Current Outpatient Rx  Name  Route  Sig  Dispense  Refill  . norethindrone-ethinyl estradiol (TRIPHASIL,CYCLAFEM,ALYACEN) 0.5/0.75/1-35 MG-MCG tablet   Oral   Take 1 tablet by mouth daily.         Marland Kitchen acetaminophen (TYLENOL) 325 MG tablet   Oral   Take 325 mg by mouth every 6 (six) hours as needed. For pain         . calcium carbonate (TUMS - DOSED IN MG ELEMENTAL CALCIUM) 500 MG chewable tablet   Oral   Chew 1-2 tablets by mouth daily as needed. For acid reflux         . ibuprofen (ADVIL,MOTRIN) 600 MG tablet   Oral   Take 1 tablet (600 mg total) by mouth every 6 (six) hours.   30 tablet   1   . Prenatal Vit-Fe Fumarate-FA (PRENATAL MULTIVITAMIN) TABS   Oral   Take 1 tablet by mouth at bedtime.            BP 127/67  Pulse 92  Temp(Src) 98.4 F (36.9 C) (Oral)  Resp 16  SpO2 97%  LMP 08/09/2012  Breastfeeding? No  Physical Exam  Nursing note and vitals reviewed. Constitutional: She appears well-developed and well-nourished.  HENT:  Head: Normocephalic  and atraumatic.  Cardiovascular: Normal rate.   Pulmonary/Chest: Effort normal and breath sounds normal.  Abdominal: Soft. Bowel sounds are normal. There is no tenderness.  Genitourinary:  No discharge:-cmt  Musculoskeletal: Normal range of motion.  Neurological: She is alert.  Skin: Skin is warm and dry.    ED Course  Procedures (including critical care time)  Labs Reviewed  WET PREP, GENITAL - Abnormal; Notable for the following:    WBC, Wet Prep HPF POC FEW (*)    All other components within normal limits  URINALYSIS, ROUTINE W REFLEX MICROSCOPIC - Abnormal; Notable for the following:    Hgb urine dipstick TRACE (*)    Leukocytes, UA TRACE (*)    All other components within normal limits  URINE MICROSCOPIC-ADD ON - Abnormal; Notable for the following:    Bacteria, UA FEW (*)    All other components  within normal limits  GC/CHLAMYDIA PROBE AMP  URINE CULTURE  PREGNANCY, URINE   No results found.   1. Vaginal irritation       MDM  No discharge noted:std cultures sent:urine sent for culture        Teressa Lower, NP 09/02/12 1319

## 2012-09-03 LAB — GC/CHLAMYDIA PROBE AMP
CT Probe RNA: NEGATIVE
GC Probe RNA: NEGATIVE

## 2012-09-03 LAB — URINE CULTURE: Colony Count: NO GROWTH

## 2012-09-04 ENCOUNTER — Telehealth (HOSPITAL_COMMUNITY): Payer: Self-pay | Admitting: Emergency Medicine

## 2012-09-04 NOTE — ED Notes (Signed)
Pt called about getting her results and pt was given negative results.

## 2012-09-17 ENCOUNTER — Emergency Department (HOSPITAL_COMMUNITY)
Admission: EM | Admit: 2012-09-17 | Discharge: 2012-09-17 | Disposition: A | Discharge status: Home / Self Care | Payer: Medicaid Other | Attending: Emergency Medicine | Admitting: Emergency Medicine

## 2012-09-17 ENCOUNTER — Emergency Department (HOSPITAL_COMMUNITY): Payer: Medicaid Other

## 2012-09-17 ENCOUNTER — Encounter (HOSPITAL_COMMUNITY): Payer: Self-pay | Admitting: Emergency Medicine

## 2012-09-17 DIAGNOSIS — F411 Generalized anxiety disorder: Secondary | ICD-10-CM | POA: Insufficient documentation

## 2012-09-17 DIAGNOSIS — F17200 Nicotine dependence, unspecified, uncomplicated: Secondary | ICD-10-CM | POA: Insufficient documentation

## 2012-09-17 DIAGNOSIS — Z79899 Other long term (current) drug therapy: Secondary | ICD-10-CM | POA: Insufficient documentation

## 2012-09-17 DIAGNOSIS — R000 Tachycardia, unspecified: Secondary | ICD-10-CM | POA: Insufficient documentation

## 2012-09-17 DIAGNOSIS — Z8679 Personal history of other diseases of the circulatory system: Secondary | ICD-10-CM | POA: Insufficient documentation

## 2012-09-17 DIAGNOSIS — R509 Fever, unspecified: Secondary | ICD-10-CM | POA: Insufficient documentation

## 2012-09-17 DIAGNOSIS — Z87440 Personal history of urinary (tract) infections: Secondary | ICD-10-CM | POA: Insufficient documentation

## 2012-09-17 DIAGNOSIS — Z8742 Personal history of other diseases of the female genital tract: Secondary | ICD-10-CM | POA: Insufficient documentation

## 2012-09-17 DIAGNOSIS — Z8619 Personal history of other infectious and parasitic diseases: Secondary | ICD-10-CM | POA: Insufficient documentation

## 2012-09-17 DIAGNOSIS — A088 Other specified intestinal infections: Secondary | ICD-10-CM | POA: Insufficient documentation

## 2012-09-17 DIAGNOSIS — N949 Unspecified condition associated with female genital organs and menstrual cycle: Secondary | ICD-10-CM | POA: Insufficient documentation

## 2012-09-17 DIAGNOSIS — Z3202 Encounter for pregnancy test, result negative: Secondary | ICD-10-CM | POA: Insufficient documentation

## 2012-09-17 DIAGNOSIS — Z862 Personal history of diseases of the blood and blood-forming organs and certain disorders involving the immune mechanism: Secondary | ICD-10-CM | POA: Insufficient documentation

## 2012-09-17 DIAGNOSIS — R109 Unspecified abdominal pain: Secondary | ICD-10-CM | POA: Insufficient documentation

## 2012-09-17 DIAGNOSIS — K219 Gastro-esophageal reflux disease without esophagitis: Secondary | ICD-10-CM | POA: Insufficient documentation

## 2012-09-17 DIAGNOSIS — A084 Viral intestinal infection, unspecified: Secondary | ICD-10-CM

## 2012-09-17 DIAGNOSIS — Z8669 Personal history of other diseases of the nervous system and sense organs: Secondary | ICD-10-CM | POA: Insufficient documentation

## 2012-09-17 DIAGNOSIS — R197 Diarrhea, unspecified: Secondary | ICD-10-CM | POA: Insufficient documentation

## 2012-09-17 LAB — URINALYSIS, MICROSCOPIC ONLY
Nitrite: NEGATIVE
Specific Gravity, Urine: 1.023 (ref 1.005–1.030)
pH: 6 (ref 5.0–8.0)

## 2012-09-17 LAB — CBC WITH DIFFERENTIAL/PLATELET
Basophils Relative: 0 % (ref 0–1)
Eosinophils Absolute: 0 10*3/uL (ref 0.0–0.7)
HCT: 38.1 % (ref 36.0–46.0)
Hemoglobin: 12.9 g/dL (ref 12.0–15.0)
MCH: 29.5 pg (ref 26.0–34.0)
MCHC: 33.9 g/dL (ref 30.0–36.0)
Monocytes Absolute: 0.3 10*3/uL (ref 0.1–1.0)
Monocytes Relative: 4 % (ref 3–12)
Neutrophils Relative %: 85 % — ABNORMAL HIGH (ref 43–77)

## 2012-09-17 LAB — LIPASE, BLOOD: Lipase: 19 U/L (ref 11–59)

## 2012-09-17 LAB — COMPREHENSIVE METABOLIC PANEL
Albumin: 4.1 g/dL (ref 3.5–5.2)
BUN: 13 mg/dL (ref 6–23)
Creatinine, Ser: 0.68 mg/dL (ref 0.50–1.10)
Total Protein: 7.4 g/dL (ref 6.0–8.3)

## 2012-09-17 LAB — POCT PREGNANCY, URINE: Preg Test, Ur: NEGATIVE

## 2012-09-17 MED ORDER — ONDANSETRON HCL 4 MG PO TABS: 4.0000 mg | ORAL_TABLET | Freq: Three times a day (TID) | ORAL | Status: DC | PRN

## 2012-09-17 MED ORDER — SODIUM CHLORIDE 0.9 % IV BOLUS (SEPSIS)
1000.0000 mL | Freq: Once | INTRAVENOUS | Status: AC
  Administered 2012-09-17: 1000 mL via INTRAVENOUS

## 2012-09-17 MED ORDER — FENTANYL CITRATE 0.05 MG/ML IJ SOLN
50.0000 ug | Freq: Once | INTRAMUSCULAR | Status: AC
  Administered 2012-09-17: 50 ug via INTRAVENOUS
  Filled 2012-09-17: qty 2

## 2012-09-17 MED ORDER — HYDROCODONE-ACETAMINOPHEN 5-325 MG PO TABS
1.0000 | ORAL_TABLET | Freq: Four times a day (QID) | ORAL | Status: DC | PRN
Start: 2012-09-17 — End: 2014-05-17

## 2012-09-17 MED ORDER — ONDANSETRON HCL 4 MG/2ML IJ SOLN
4.0000 mg | Freq: Once | INTRAMUSCULAR | Status: AC
  Administered 2012-09-17: 4 mg via INTRAVENOUS
  Filled 2012-09-17: qty 2

## 2012-09-17 NOTE — ED Provider Notes (Signed)
I saw  the patient, reviewed the resident's note and I agree with the findings and plan.   .Face to face Exam:  General:  Awake HEENT:  Atraumatic Resp:  Normal effort Abd:  Nondistended Neuro:No focal weakness    Nelia Shi, MD 09/17/12 2244

## 2012-09-17 NOTE — ED Notes (Signed)
Pt c/o NVD since last night.  Does not know how many times she has thrown up...maybe 30 times.

## 2012-09-17 NOTE — ED Provider Notes (Signed)
History     CSN: [id number]  Arrival date & time 09/17/12  1736   First MD Initiated Contact with Patient 09/17/12 1740      Chief Complaint  Patient presents with  . Nausea  . Emesis  . Diarrhea    (Consider location/radiation/quality/duration/timing/severity/associated sxs/prior treatment) HPI 27 yo F with n/v/d/abdominal pain since last night.  Reports fever at home to 102.  Unable to tolerate PO at home.  Her 25 month old son had diarrhea and vomiting last week.  Abdominal pain is in the lower quadrants.  Multiple episodes of NBNB emesis.  Diarrhea is watery, no blood or mucous.  No history of suspect food.  Denies any vaginal discharge or urinary symptoms.  Did have a new sexual partner 4 days ago and she does not remember if they used condoms.  LMP 09/10/12.    Past Medical History  Diagnosis Date  . Anemia   . Headache   . Urinary tract infection   . Abnormal Pap smear   . Human papilloma virus     cells removed x2  . Chlamydia   . Trichomonas   . GERD (gastroesophageal reflux disease)   . Spinal headache   . Anxiety     Past Surgical History  Procedure Laterality Date  . Dilation and curettage of uterus    . Therapeutic abortion    . Pilonidal cyst excision    . Gynecologic cryosurgery      Family History  Problem Relation Age of Onset  . Hypertension Mother   . Fibroids Mother   . Cancer Maternal Grandfather     History  Substance Use Topics  . Smoking status: Current Every Day Smoker -- 0.25 packs/day for 14 years  . Smokeless tobacco: Never Used  . Alcohol Use: Yes     Comment: Stopped with preg./ socially    OB History   Grav Para Term Preterm Abortions TAB SAB Ect Mult Living   7 3 3  0 4 4 0 0 0 3      Review of Systems  Constitutional: Positive for fever.  HENT: Negative.   Eyes: Negative.   Respiratory: Negative.   Gastrointestinal: Positive for nausea, vomiting, abdominal pain and diarrhea. Negative for blood in stool.  Genitourinary:  Negative.   Musculoskeletal: Negative.   Skin: Negative.   Neurological: Negative.     Allergies  Review of patient's allergies indicates no known allergies.  Home Medications   Current Outpatient Rx  Name  Route  Sig  Dispense  Refill  . norethindrone-ethinyl estradiol (TRIPHASIL,CYCLAFEM,ALYACEN) 0.5/0.75/1-35 MG-MCG tablet   Oral   Take 1 tablet by mouth daily.           BP 109/68  Pulse 114  Temp(Src) 98.5 F (36.9 C) (Oral)  Resp 18  SpO2 100%  LMP 09/10/2012  Physical Exam  Constitutional: She is oriented to person, place, and time. She appears well-developed and well-nourished. No distress.  HENT:  Head: Normocephalic and atraumatic.  Right Ear: External ear normal.  Left Ear: External ear normal.  Mouth/Throat: Oropharynx is clear and moist. No oropharyngeal exudate.  Eyes: Conjunctivae and EOM are normal. Pupils are equal, round, and reactive to light. Right eye exhibits no discharge. Left eye exhibits no discharge. No scleral icterus.  Neck: Normal range of motion. Neck supple.  Cardiovascular: Regular rhythm, normal heart sounds and intact distal pulses.  Tachycardia present.   No murmur heard. Pulmonary/Chest: Effort normal and breath sounds normal. No respiratory distress. She  has no rales.  Abdominal: Soft. Bowel sounds are normal. She exhibits no distension and no mass. There is tenderness (lower quadrants). There is no rebound and no guarding.  Genitourinary: Vagina normal. Cervix exhibits no motion tenderness and no discharge. Right adnexum displays tenderness and fullness. Left adnexum displays no tenderness and no fullness. No vaginal discharge found.  Musculoskeletal: She exhibits no edema and no tenderness.  Neurological: She is alert and oriented to person, place, and time. She exhibits normal muscle tone.  Skin: Skin is warm and dry. No rash noted. She is not diaphoretic. No erythema.  Psychiatric: She has a normal mood and affect. Thought content  normal.    ED Course  Procedures (including critical care time)  Labs Reviewed  CBC WITH DIFFERENTIAL - Abnormal; Notable for the following:    Neutrophils Relative 85 (*)    Lymphocytes Relative 10 (*)    All other components within normal limits  COMPREHENSIVE METABOLIC PANEL - Abnormal; Notable for the following:    Sodium 134 (*)    ALT 66 (*)    All other components within normal limits  URINALYSIS, MICROSCOPIC ONLY - Abnormal; Notable for the following:    APPearance CLOUDY (*)    Hgb urine dipstick MODERATE (*)    Protein, ur 100 (*)    Leukocytes, UA LARGE (*)    Bacteria, UA FEW (*)    Squamous Epithelial / LPF FEW (*)    All other components within normal limits  GC/CHLAMYDIA PROBE AMP  URINE CULTURE  LIPASE, BLOOD  POCT PREGNANCY, URINE   US Transvaginal Non-ob  09/17/2012  *RADIOLOGY REPORT*  Clinical Data:  Right-sided pelvic pain  TRANSABDOMINAL AND TRANSVAGINAL ULTRASOUND OF PELVIS DOPPLER ULTRASOUND OF OVARIES  Technique:  Both transabdominal and transvaginal ultrasound examinations of the pelvis were performed. Transabdominal technique was performed for global imaging of the pelvis including uterus, ovaries, adnexal regions, and pelvic cul-de-sac.  It was necessary to proceed with endovaginal exam following the transabdominal exam to visualize the ovaries and endometrium.  Color and duplex Doppler ultrasound was utilized to evaluate blood flow to the ovaries.  Comparison:  02/10/2011  Findings:  Uterus:  Retroverted, retroflexed.  7.7 x 5.5 x 4.4 cm.  No focal abnormality.  Endometrium:  6 mm.  Uniformly trilaminar in appearance without focal abnormality.  Right ovary: 2.4 x 2.1 x 1.7 cm.  Normal.  Left ovary:   3.1 x 2.1 x 1.9 cm.  Normal.  Pulsed Doppler evaluation demonstrates normal low-resistance arterial and venous waveforms in both ovaries.  IMPRESSION: Normal exam.  No evidence of pelvic mass or other significant abnormality.  No sonographic evidence for ovarian  torsion.   Original Report Authenticated By: Christiana Pellant, M.D.    US Pelvis Complete  09/17/2012  *RADIOLOGY REPORT*  Clinical Data:  Right-sided pelvic pain  TRANSABDOMINAL AND TRANSVAGINAL ULTRASOUND OF PELVIS DOPPLER ULTRASOUND OF OVARIES  Technique:  Both transabdominal and transvaginal ultrasound examinations of the pelvis were performed. Transabdominal technique was performed for global imaging of the pelvis including uterus, ovaries, adnexal regions, and pelvic cul-de-sac.  It was necessary to proceed with endovaginal exam following the transabdominal exam to visualize the ovaries and endometrium.  Color and duplex Doppler ultrasound was utilized to evaluate blood flow to the ovaries.  Comparison:  02/10/2011  Findings:  Uterus:  Retroverted, retroflexed.  7.7 x 5.5 x 4.4 cm.  No focal abnormality.  Endometrium:  6 mm.  Uniformly trilaminar in appearance without focal abnormality.  Right ovary:  2.4 x 2.1 x 1.7 cm.  Normal.  Left ovary:   3.1 x 2.1 x 1.9 cm.  Normal.  Pulsed Doppler evaluation demonstrates normal low-resistance arterial and venous waveforms in both ovaries.  IMPRESSION: Normal exam.  No evidence of pelvic mass or other significant abnormality.  No sonographic evidence for ovarian torsion.   Original Report Authenticated By: Christiana Pellant, M.D.    [place] Art/ven Flow Abd Pelv Doppler  09/17/2012  *RADIOLOGY REPORT*  Clinical Data:  Right-sided pelvic pain  TRANSABDOMINAL AND TRANSVAGINAL ULTRASOUND OF PELVIS DOPPLER ULTRASOUND OF OVARIES  Technique:  Both transabdominal and transvaginal ultrasound examinations of the pelvis were performed. Transabdominal technique was performed for global imaging of the pelvis including uterus, ovaries, adnexal regions, and pelvic cul-de-sac.  It was necessary to proceed with endovaginal exam following the transabdominal exam to visualize the ovaries and endometrium.  Color and duplex Doppler ultrasound was utilized to evaluate blood flow to the ovaries.   Comparison:  02/10/2011  Findings:  Uterus:  Retroverted, retroflexed.  7.7 x 5.5 x 4.4 cm.  No focal abnormality.  Endometrium:  6 mm.  Uniformly trilaminar in appearance without focal abnormality.  Right ovary: 2.4 x 2.1 x 1.7 cm.  Normal.  Left ovary:   3.1 x 2.1 x 1.9 cm.  Normal.  Pulsed Doppler evaluation demonstrates normal low-resistance arterial and venous waveforms in both ovaries.  IMPRESSION: Normal exam.  No evidence of pelvic mass or other significant abnormality.  No sonographic evidence for ovarian torsion.   Original Report Authenticated By: Christiana Pellant, M.D.      No diagnosis found.    MDM  27 yo F with 1 day of nausea, vomiting, diarrhea, abdominal pain and fever.  Viral gastroenteritis is likely given family member sick with same symptoms last week.  With history of new sexual partner, PID is also a possibility.  UTI with pyelo also possible, but no flank pain or urinary symptoms.  Will check basic labs, urine, and do a pelvic exam.  Zofran for nausea, 1L NS bolus.  7:00PM: Pelvic exam was negative for discharge, but had some fullness and mild tenderness of the right adnexa.  Will check pelvic ultrasound.   10:00PM: Ultrasound negative.  Urine borderline for infection but asymptomatic.  Will culture and call if positive.  Tolerating PO well.  Will d/c with zofran and norco.  Return precautions reviewed.  Follow up with PCP.  Phebe Colla, MD 09/17/12 [phone]

## 2012-09-17 NOTE — ED Notes (Signed)
Patient transported to Ultrasound 

## 2012-09-18 LAB — URINE CULTURE: Colony Count: NO GROWTH

## 2012-10-17 ENCOUNTER — Encounter (HOSPITAL_BASED_OUTPATIENT_CLINIC_OR_DEPARTMENT_OTHER): Payer: Self-pay | Admitting: *Deleted

## 2012-10-17 ENCOUNTER — Emergency Department (HOSPITAL_BASED_OUTPATIENT_CLINIC_OR_DEPARTMENT_OTHER)
Admission: EM | Admit: 2012-10-17 | Discharge: 2012-10-17 | Disposition: A | Discharge status: Home / Self Care | Payer: Medicaid Other | Attending: Emergency Medicine | Admitting: Emergency Medicine

## 2012-10-17 DIAGNOSIS — B9689 Other specified bacterial agents as the cause of diseases classified elsewhere: Secondary | ICD-10-CM

## 2012-10-17 DIAGNOSIS — Z8669 Personal history of other diseases of the nervous system and sense organs: Secondary | ICD-10-CM | POA: Insufficient documentation

## 2012-10-17 DIAGNOSIS — L293 Anogenital pruritus, unspecified: Secondary | ICD-10-CM | POA: Insufficient documentation

## 2012-10-17 DIAGNOSIS — J029 Acute pharyngitis, unspecified: Secondary | ICD-10-CM | POA: Insufficient documentation

## 2012-10-17 DIAGNOSIS — Z862 Personal history of diseases of the blood and blood-forming organs and certain disorders involving the immune mechanism: Secondary | ICD-10-CM | POA: Insufficient documentation

## 2012-10-17 DIAGNOSIS — Z9889 Other specified postprocedural states: Secondary | ICD-10-CM | POA: Insufficient documentation

## 2012-10-17 DIAGNOSIS — Z8719 Personal history of other diseases of the digestive system: Secondary | ICD-10-CM | POA: Insufficient documentation

## 2012-10-17 DIAGNOSIS — Z8619 Personal history of other infectious and parasitic diseases: Secondary | ICD-10-CM | POA: Insufficient documentation

## 2012-10-17 DIAGNOSIS — N898 Other specified noninflammatory disorders of vagina: Secondary | ICD-10-CM | POA: Insufficient documentation

## 2012-10-17 DIAGNOSIS — H6691 Otitis media, unspecified, right ear: Secondary | ICD-10-CM

## 2012-10-17 DIAGNOSIS — N760 Acute vaginitis: Secondary | ICD-10-CM | POA: Insufficient documentation

## 2012-10-17 DIAGNOSIS — Z87440 Personal history of urinary (tract) infections: Secondary | ICD-10-CM | POA: Insufficient documentation

## 2012-10-17 DIAGNOSIS — Z8659 Personal history of other mental and behavioral disorders: Secondary | ICD-10-CM | POA: Insufficient documentation

## 2012-10-17 DIAGNOSIS — F17200 Nicotine dependence, unspecified, uncomplicated: Secondary | ICD-10-CM | POA: Insufficient documentation

## 2012-10-17 DIAGNOSIS — H6690 Otitis media, unspecified, unspecified ear: Secondary | ICD-10-CM | POA: Insufficient documentation

## 2012-10-17 DIAGNOSIS — Z3202 Encounter for pregnancy test, result negative: Secondary | ICD-10-CM | POA: Insufficient documentation

## 2012-10-17 DIAGNOSIS — Z79899 Other long term (current) drug therapy: Secondary | ICD-10-CM | POA: Insufficient documentation

## 2012-10-17 LAB — URINALYSIS, ROUTINE W REFLEX MICROSCOPIC
Bilirubin Urine: NEGATIVE
Glucose, UA: NEGATIVE mg/dL
Ketones, ur: NEGATIVE mg/dL
Leukocytes, UA: NEGATIVE
Protein, ur: NEGATIVE mg/dL

## 2012-10-17 LAB — URINE MICROSCOPIC-ADD ON

## 2012-10-17 LAB — WET PREP, GENITAL: Yeast Wet Prep HPF POC: NONE SEEN

## 2012-10-17 MED ORDER — AMOXICILLIN 500 MG PO CAPS: 500.0000 mg | ORAL_CAPSULE | Freq: Three times a day (TID) | ORAL | Status: DC

## 2012-10-17 MED ORDER — FLUCONAZOLE 200 MG PO TABS: ORAL_TABLET | ORAL | Status: DC

## 2012-10-17 MED ORDER — METRONIDAZOLE 500 MG PO TABS: 500.0000 mg | ORAL_TABLET | Freq: Two times a day (BID) | ORAL | Status: DC

## 2012-10-17 MED ORDER — FLUCONAZOLE 50 MG PO TABS
150.0000 mg | ORAL_TABLET | Freq: Once | ORAL | Status: AC
  Administered 2012-10-17: 150 mg via ORAL
  Filled 2012-10-17: qty 1

## 2012-10-17 NOTE — ED Provider Notes (Signed)
History     CSN: [id number]  Arrival date & time 10/17/12  0950   First MD Initiated Contact with Patient 10/17/12 1200      Chief Complaint  Patient presents with  . Otalgia    possible yeast infection    (Consider location/radiation/quality/duration/timing/severity/associated sxs/prior treatment) Patient is a 27 y.o. female presenting with ear pain. The history is provided by the patient.  Otalgia Location:  Right (Pt gives a 2 day history of sore throat, pain in the right ear and right side of the neck and right temporal region.) Behind ear:  No abnormality Quality:  Aching Severity:  Moderate Onset quality:  Gradual Duration:  2 days Timing:  Intermittent Progression:  Worsening Chronicity:  New Relieved by:  Nothing Worsened by:  Nothing tried Associated symptoms: sore throat   Associated symptoms: no fever   Associated symptoms comment:  She also notes a white vaginal discharge and vaginal itching. Risk factors: no chronic ear infection and no prior ear surgery     Past Medical History  Diagnosis Date  . Anemia   . Headache   . Urinary tract infection   . Abnormal Pap smear   . Human papilloma virus     cells removed x2  . Chlamydia   . Trichomonas   . GERD (gastroesophageal reflux disease)   . Spinal headache   . Anxiety     Past Surgical History  Procedure Laterality Date  . Dilation and curettage of uterus    . Therapeutic abortion    . Pilonidal cyst excision    . Gynecologic cryosurgery      Family History  Problem Relation Age of Onset  . Hypertension Mother   . Fibroids Mother   . Cancer Maternal Grandfather     History  Substance Use Topics  . Smoking status: Current Every Day Smoker -- 0.25 packs/day for 14 years  . Smokeless tobacco: Never Used  . Alcohol Use: Yes     Comment:  socially    OB History   Grav Para Term Preterm Abortions TAB SAB Ect Mult Living   7 3 3  0 4 4 0 0 0 3      Review of Systems  Constitutional:  Negative for fever and chills.  HENT: Positive for ear pain and sore throat.   Respiratory: Negative.   Cardiovascular: Negative.   Gastrointestinal: Negative.   Genitourinary: Positive for vaginal discharge.  Musculoskeletal: Negative.   Skin: Negative.   Neurological: Negative.   Psychiatric/Behavioral: Negative.     Allergies  Review of patient's allergies indicates no known allergies.  Home Medications   Current Outpatient Rx  Name  Route  Sig  Dispense  Refill  . amoxicillin (AMOXIL) 500 MG capsule   Oral   Take 1 capsule (500 mg total) by mouth 3 (three) times daily.   30 capsule   0   . fluconazole (DIFLUCAN) 200 MG tablet      Take if you develop vaginal itching.   1 tablet   0   . HYDROcodone-acetaminophen (NORCO) 5-325 MG per tablet   Oral   Take 1 tablet by mouth every 6 (six) hours as needed for pain.   15 tablet   0   . metroNIDAZOLE (FLAGYL) 500 MG tablet   Oral   Take 1 tablet (500 mg total) by mouth 2 (two) times daily.   14 tablet   0   . norethindrone-ethinyl estradiol (TRIPHASIL,CYCLAFEM,ALYACEN) 0.5/0.75/1-35 MG-MCG tablet   Oral  Take 1 tablet by mouth daily.         . ondansetron (ZOFRAN) 4 MG tablet   Oral   Take 1 tablet (4 mg total) by mouth every 8 (eight) hours as needed for nausea.   20 tablet   0     BP 115/70  Pulse 80  Temp(Src) 98 F (36.7 C) (Oral)  Resp 20  Ht 5' (1.524 m)  Wt 130 lb (58.968 kg)  BMI 25.39 kg/m2  SpO2 100%  LMP 10/10/2012  Breastfeeding? No  Physical Exam  Nursing note and vitals reviewed. Constitutional: She is oriented to person, place, and time. She appears well-developed and well-nourished.  In no visible distress.  HENT:  Head: Normocephalic and atraumatic.  Left Ear: External ear normal.  Mouth/Throat: Oropharynx is clear and moist.  R TM red.  Eyes: Conjunctivae and EOM are normal. Pupils are equal, round, and reactive to light.  Neck: Normal range of motion. Neck supple.   Cardiovascular: Normal rate, regular rhythm and normal heart sounds.   Pulmonary/Chest: Effort normal and breath sounds normal.  Abdominal: Soft. Bowel sounds are normal. She exhibits no distension. There is no tenderness.  Genitourinary:  Normal external female genitalia.  Has thick white vaginal discharge.  No uterine or adnexal tenderness.  No skin lesions.  Musculoskeletal: Normal range of motion.  Lymphadenopathy:    She has no cervical adenopathy.  Neurological: She is alert and oriented to person, place, and time.  No sensory or motor deficit.  Skin: Skin is warm and dry.  Psychiatric: She has a normal mood and affect. Her behavior is normal.    ED Course  Procedures (including critical care time)  Results for orders placed during the hospital encounter of 10/17/12  WET PREP, GENITAL      Result Value Range   Yeast Wet Prep HPF POC NONE SEEN  NONE SEEN   Trich, Wet Prep NONE SEEN  NONE SEEN   Clue Cells Wet Prep HPF POC FEW (*) NONE SEEN   WBC, Wet Prep HPF POC FEW (*) NONE SEEN  URINALYSIS, ROUTINE W REFLEX MICROSCOPIC      Result Value Range   Color, Urine YELLOW  YELLOW   APPearance CLEAR  CLEAR   Specific Gravity, Urine 1.015  1.005 - 1.030   pH 5.5  5.0 - 8.0   Glucose, UA NEGATIVE  NEGATIVE mg/dL   Hgb urine dipstick SMALL (*) NEGATIVE   Bilirubin Urine NEGATIVE  NEGATIVE   Ketones, ur NEGATIVE  NEGATIVE mg/dL   Protein, ur NEGATIVE  NEGATIVE mg/dL   Urobilinogen, UA 0.2  0.0 - 1.0 mg/dL   Nitrite NEGATIVE  NEGATIVE   Leukocytes, UA NEGATIVE  NEGATIVE  PREGNANCY, URINE      Result Value Range   Preg Test, Ur NEGATIVE  NEGATIVE  URINE MICROSCOPIC-ADD ON      Result Value Range   Squamous Epithelial / LPF RARE  RARE   WBC, UA    <3 WBC/hpf   Value: NO FORMED ELEMENTS SEEN ON URINE MICROSCOPIC EXAMINATION   RBC / HPF 0-2  <3 RBC/hpf   Bacteria, UA RARE  RARE   Wet prep suggests bacterial vaginosis.  Rx with amoxicillin for otitis media, metronidazole for  BV, and diflucan to prevent candidal overgrowth.    1. Otitis media, right   2. Bacterial vaginosis          Carleene Cooper III, MD 10/17/12 2116

## 2012-10-17 NOTE — ED Notes (Signed)
Patient states for the last two days she has had pain in her throat, ear and head on the right side.  Denies fever.  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

## 2012-10-18 LAB — GC/CHLAMYDIA PROBE AMP
CT Probe RNA: NEGATIVE
GC Probe RNA: NEGATIVE

## 2013-04-15 ENCOUNTER — Emergency Department (HOSPITAL_BASED_OUTPATIENT_CLINIC_OR_DEPARTMENT_OTHER)
Admission: EM | Admit: 2013-04-15 | Discharge: 2013-04-15 | Disposition: A | Discharge status: Home / Self Care | Payer: Medicaid Other | Attending: Emergency Medicine | Admitting: Emergency Medicine

## 2013-04-15 ENCOUNTER — Encounter (HOSPITAL_BASED_OUTPATIENT_CLINIC_OR_DEPARTMENT_OTHER): Payer: Self-pay | Admitting: Emergency Medicine

## 2013-04-15 DIAGNOSIS — H6690 Otitis media, unspecified, unspecified ear: Secondary | ICD-10-CM | POA: Insufficient documentation

## 2013-04-15 DIAGNOSIS — Z862 Personal history of diseases of the blood and blood-forming organs and certain disorders involving the immune mechanism: Secondary | ICD-10-CM | POA: Insufficient documentation

## 2013-04-15 DIAGNOSIS — IMO0002 Reserved for concepts with insufficient information to code with codable children: Secondary | ICD-10-CM | POA: Insufficient documentation

## 2013-04-15 DIAGNOSIS — F17200 Nicotine dependence, unspecified, uncomplicated: Secondary | ICD-10-CM | POA: Insufficient documentation

## 2013-04-15 DIAGNOSIS — Z8659 Personal history of other mental and behavioral disorders: Secondary | ICD-10-CM | POA: Insufficient documentation

## 2013-04-15 DIAGNOSIS — Z87440 Personal history of urinary (tract) infections: Secondary | ICD-10-CM | POA: Insufficient documentation

## 2013-04-15 DIAGNOSIS — Z792 Long term (current) use of antibiotics: Secondary | ICD-10-CM | POA: Insufficient documentation

## 2013-04-15 DIAGNOSIS — Z8619 Personal history of other infectious and parasitic diseases: Secondary | ICD-10-CM | POA: Insufficient documentation

## 2013-04-15 DIAGNOSIS — Z79899 Other long term (current) drug therapy: Secondary | ICD-10-CM | POA: Insufficient documentation

## 2013-04-15 DIAGNOSIS — Z8719 Personal history of other diseases of the digestive system: Secondary | ICD-10-CM | POA: Insufficient documentation

## 2013-04-15 MED ORDER — AMOXICILLIN 500 MG PO CAPS: 500.0000 mg | ORAL_CAPSULE | Freq: Three times a day (TID) | ORAL | Status: DC

## 2013-04-15 NOTE — ED Provider Notes (Signed)
CSN: [id number]     Arrival date & time 04/15/13  1116 History   First MD Initiated Contact with Patient 04/15/13 1128     Chief Complaint  Patient presents with  . Otitis Media    L   (Consider location/radiation/quality/duration/timing/severity/associated sxs/prior Treatment) Patient is a 27 y.o. female presenting with ear pain. The history is provided by the patient.  Otalgia Location:  Left Quality:  Aching and pressure Severity:  Moderate Onset quality:  Gradual Duration:  3 days Timing:  Constant Progression:  Worsening Chronicity:  New Context: not direct blow and not loud noise   Worsened by:  Palpation Ineffective treatments:  OTC medications, position and palpation Associated symptoms: no abdominal pain, no congestion, no cough, no diarrhea, no ear discharge, no fever, no headaches, no hearing loss, no neck pain, no rash, no rhinorrhea, no sore throat and no vomiting     Past Medical History  Diagnosis Date  . Anemia   . Headache(784.0)   . Urinary tract infection   . Abnormal Pap smear   . Human papilloma virus     cells removed x2  . Chlamydia   . Trichomonas   . GERD (gastroesophageal reflux disease)   . Spinal headache   . Anxiety    Past Surgical History  Procedure Laterality Date  . Dilation and curettage of uterus    . Therapeutic abortion    . Pilonidal cyst excision    . Gynecologic cryosurgery     Family History  Problem Relation Age of Onset  . Hypertension Mother   . Fibroids Mother   . Cancer Maternal Grandfather    History  Substance Use Topics  . Smoking status: Current Every Day Smoker -- 0.25 packs/day for 14 years  . Smokeless tobacco: Never Used  . Alcohol Use: Yes     Comment:  socially   OB History   Grav Para Term Preterm Abortions TAB SAB Ect Mult Living   7 3 3  0 4 4 0 0 0 3     Review of Systems  Constitutional: Negative for fever.  HENT: Positive for ear pain. Negative for congestion, ear discharge, hearing loss,  rhinorrhea and sore throat.   Respiratory: Negative for cough.   Gastrointestinal: Negative for vomiting, abdominal pain and diarrhea.  Musculoskeletal: Negative for neck pain.  Skin: Negative for rash.  Neurological: Negative for headaches.    Allergies  Review of patient's allergies indicates no known allergies.  Home Medications   Current Outpatient Rx  Name  Route  Sig  Dispense  Refill  . amoxicillin (AMOXIL) 500 MG capsule   Oral   Take 1 capsule (500 mg total) by mouth 3 (three) times daily.   30 capsule   0   . fluconazole (DIFLUCAN) 200 MG tablet      Take if you develop vaginal itching.   1 tablet   0   . HYDROcodone-acetaminophen (NORCO) 5-325 MG per tablet   Oral   Take 1 tablet by mouth every 6 (six) hours as needed for pain.   15 tablet   0   . metroNIDAZOLE (FLAGYL) 500 MG tablet   Oral   Take 1 tablet (500 mg total) by mouth 2 (two) times daily.   14 tablet   0   . norethindrone-ethinyl estradiol (TRIPHASIL,CYCLAFEM,ALYACEN) 0.5/0.75/1-35 MG-MCG tablet   Oral   Take 1 tablet by mouth daily.         . ondansetron (ZOFRAN) 4 MG tablet   Oral  Take 1 tablet (4 mg total) by mouth every 8 (eight) hours as needed for nausea.   20 tablet   0    BP 105/57  Temp(Src) 98.4 F (36.9 C)  Resp 20  Ht 5' (1.524 m)  Wt 130 lb (58.968 kg)  BMI 25.39 kg/m2  SpO2 100%  LMP 04/15/2013  Breastfeeding? No Physical Exam  Constitutional: She is oriented to person, place, and time. She appears well-developed and well-nourished. No distress.  HENT:  Head: Normocephalic and atraumatic.  Right Ear: External ear normal.  Left Ear: External ear normal.  Nose: Nose normal.  Mouth/Throat: Oropharynx is clear and moist. No oropharyngeal exudate.  Large amount of wax in left ear. Unable to acquire good view of TM.  Eyes: Conjunctivae and EOM are normal. Pupils are equal, round, and reactive to light.  Cardiovascular: Normal rate, normal heart sounds and intact  distal pulses.  Exam reveals no gallop.   No murmur heard. Neurological: She is alert and oriented to person, place, and time.  Skin: She is not diaphoretic.  Psychiatric: She has a normal mood and affect. Her behavior is normal.    ED Course  Procedures (including critical care time) Labs Review Labs Reviewed - No data to display Imaging Review No results found.  EKG Interpretation   None       MDM   1. AOM (acute otitis media), left     1. Acute otitis media The patient has a history of AOM. Patient likely has a recurrent episode of AOM although TM was unable to visualized due to large amount of wax. Technician cleaned the ear and a good view was obtained. There was erythema on the left TM.  No signs of otitis externa. Plan to prescribe amoxicillin and f/u with PCP. Instructed the patient to contact MD for new or worsening symptoms.    Pleas Koch, MD 04/15/13 1233

## 2013-04-15 NOTE — ED Notes (Signed)
L ear pain x 3 days. Applied peroxide still "hurting". Denies fever or URI s/s

## 2013-04-15 NOTE — ED Provider Notes (Signed)
I saw and evaluated the patient, reviewed the resident's note and I agree with the findings and plan.  EKG Interpretation   None       After ear was cleaned the TM was found to be erythematous. It is unknown if this is from a the cleaning process or a new otitis. There is no bulging. As her pain feels better, I will prescribe her amoxicillin and recommend that she wait a day or 2. If her symptoms improve she started amoxicillin otherwise she can start taking it. We'll give her ENT followup as she's been having multiple otitis media this is an adult.  Audree Camel, MD 04/15/13 [phone]

## 2013-06-12 ENCOUNTER — Emergency Department (HOSPITAL_BASED_OUTPATIENT_CLINIC_OR_DEPARTMENT_OTHER)
Admission: EM | Admit: 2013-06-12 | Discharge: 2013-06-12 | Disposition: A | Discharge status: Home / Self Care | Payer: Medicaid Other | Attending: Emergency Medicine | Admitting: Emergency Medicine

## 2013-06-12 ENCOUNTER — Encounter (HOSPITAL_BASED_OUTPATIENT_CLINIC_OR_DEPARTMENT_OTHER): Payer: Self-pay | Admitting: Emergency Medicine

## 2013-06-12 DIAGNOSIS — Z862 Personal history of diseases of the blood and blood-forming organs and certain disorders involving the immune mechanism: Secondary | ICD-10-CM | POA: Insufficient documentation

## 2013-06-12 DIAGNOSIS — Z3202 Encounter for pregnancy test, result negative: Secondary | ICD-10-CM | POA: Insufficient documentation

## 2013-06-12 DIAGNOSIS — Z8679 Personal history of other diseases of the circulatory system: Secondary | ICD-10-CM | POA: Insufficient documentation

## 2013-06-12 DIAGNOSIS — F17200 Nicotine dependence, unspecified, uncomplicated: Secondary | ICD-10-CM | POA: Insufficient documentation

## 2013-06-12 DIAGNOSIS — Z8619 Personal history of other infectious and parasitic diseases: Secondary | ICD-10-CM | POA: Insufficient documentation

## 2013-06-12 DIAGNOSIS — Z8719 Personal history of other diseases of the digestive system: Secondary | ICD-10-CM | POA: Insufficient documentation

## 2013-06-12 DIAGNOSIS — Z8659 Personal history of other mental and behavioral disorders: Secondary | ICD-10-CM | POA: Insufficient documentation

## 2013-06-12 DIAGNOSIS — Z792 Long term (current) use of antibiotics: Secondary | ICD-10-CM | POA: Insufficient documentation

## 2013-06-12 DIAGNOSIS — Z9889 Other specified postprocedural states: Secondary | ICD-10-CM | POA: Insufficient documentation

## 2013-06-12 DIAGNOSIS — N898 Other specified noninflammatory disorders of vagina: Secondary | ICD-10-CM | POA: Insufficient documentation

## 2013-06-12 DIAGNOSIS — Z79899 Other long term (current) drug therapy: Secondary | ICD-10-CM | POA: Insufficient documentation

## 2013-06-12 DIAGNOSIS — N760 Acute vaginitis: Secondary | ICD-10-CM | POA: Insufficient documentation

## 2013-06-12 DIAGNOSIS — A499 Bacterial infection, unspecified: Secondary | ICD-10-CM | POA: Insufficient documentation

## 2013-06-12 DIAGNOSIS — B9689 Other specified bacterial agents as the cause of diseases classified elsewhere: Secondary | ICD-10-CM | POA: Insufficient documentation

## 2013-06-12 LAB — URINE MICROSCOPIC-ADD ON

## 2013-06-12 LAB — URINALYSIS, ROUTINE W REFLEX MICROSCOPIC
Bilirubin Urine: NEGATIVE
Glucose, UA: NEGATIVE mg/dL
KETONES UR: NEGATIVE mg/dL
NITRITE: NEGATIVE
Protein, ur: NEGATIVE mg/dL
Specific Gravity, Urine: 1.029 (ref 1.005–1.030)
UROBILINOGEN UA: 0.2 mg/dL (ref 0.0–1.0)
pH: 6.5 (ref 5.0–8.0)

## 2013-06-12 LAB — PREGNANCY, URINE: PREG TEST UR: NEGATIVE

## 2013-06-12 LAB — WET PREP, GENITAL
Trich, Wet Prep: NONE SEEN
YEAST WET PREP: NONE SEEN

## 2013-06-12 MED ORDER — METRONIDAZOLE 500 MG PO TABS: 500.0000 mg | ORAL_TABLET | Freq: Two times a day (BID) | ORAL | Status: DC

## 2013-06-12 NOTE — ED Provider Notes (Signed)
CSN: [id number]     Arrival date & time 06/12/13  1215 History   First MD Initiated Contact with Patient 06/12/13 1219     Chief Complaint  Patient presents with  . Abdominal Pain   (Consider location/radiation/quality/duration/timing/severity/associated sxs/prior Treatment) HPI Comments: Patient is a 28 year old female who presents with lower abdominal pain for the past 2 weeks. The pain is located in her lower abdomen and does not radiate. The pain is described as aching and moderate. The pain started gradually and progressively worsened since the onset. No alleviating/aggravating factors. The patient has tried nothing for symptoms without relief. Associated symptoms include malodorous vaginal discharge. Patient denies fever, headache, NVD, chest pain, SOB, dysuria, constipation. Patient reports recently changing her soap and thinks that is what caused her symptoms. No new sexual contacts.    Past Medical History  Diagnosis Date  . Anemia   . Headache(784.0)   . Urinary tract infection   . Abnormal Pap smear   . Human papilloma virus     cells removed x2  . Chlamydia   . Trichomonas   . GERD (gastroesophageal reflux disease)   . Spinal headache   . Anxiety    Past Surgical History  Procedure Laterality Date  . Dilation and curettage of uterus    . Therapeutic abortion    . Pilonidal cyst excision    . Gynecologic cryosurgery     Family History  Problem Relation Age of Onset  . Hypertension Mother   . Fibroids Mother   . Cancer Maternal Grandfather    History  Substance Use Topics  . Smoking status: Current Every Day Smoker -- 0.25 packs/day for 14 years  . Smokeless tobacco: Never Used  . Alcohol Use: Yes     Comment:  socially   OB History   Grav Para Term Preterm Abortions TAB SAB Ect Mult Living   7 3 3  0 4 4 0 0 0 3     Review of Systems  Constitutional: Negative for fever, chills and fatigue.  HENT: Negative for trouble swallowing.   Eyes: Negative for  visual disturbance.  Respiratory: Negative for shortness of breath.   Cardiovascular: Negative for chest pain and palpitations.  Gastrointestinal: Positive for abdominal pain. Negative for nausea, vomiting and diarrhea.  Genitourinary: Positive for vaginal discharge. Negative for dysuria and difficulty urinating.  Musculoskeletal: Negative for arthralgias and neck pain.  Skin: Negative for color change.  Neurological: Negative for dizziness and weakness.  Psychiatric/Behavioral: Negative for dysphoric mood.    Allergies  Review of patient's allergies indicates no known allergies.  Home Medications   Current Outpatient Rx  Name  Route  Sig  Dispense  Refill  . amoxicillin (AMOXIL) 500 MG capsule   Oral   Take 1 capsule (500 mg total) by mouth 3 (three) times daily.   21 capsule   0   . fluconazole (DIFLUCAN) 200 MG tablet      Take if you develop vaginal itching.   1 tablet   0   . HYDROcodone-acetaminophen (NORCO) 5-325 MG per tablet   Oral   Take 1 tablet by mouth every 6 (six) hours as needed for pain.   15 tablet   0   . metroNIDAZOLE (FLAGYL) 500 MG tablet   Oral   Take 1 tablet (500 mg total) by mouth 2 (two) times daily.   14 tablet   0   . norethindrone-ethinyl estradiol (TRIPHASIL,CYCLAFEM,ALYACEN) 0.5/0.75/1-35 MG-MCG tablet   Oral   Take  1 tablet by mouth daily.         . ondansetron (ZOFRAN) 4 MG tablet   Oral   Take 1 tablet (4 mg total) by mouth every 8 (eight) hours as needed for nausea.   20 tablet   0    BP 105/58  Pulse 94  Temp(Src) 98.3 F (36.8 C) (Oral)  Resp 16  Ht 5' (1.524 m)  SpO2 100%  LMP 05/22/2013 Physical Exam  Nursing note and vitals reviewed. Constitutional: She is oriented to person, place, and time. She appears well-developed and well-nourished. No distress.  HENT:  Head: Normocephalic and atraumatic.  Eyes: Conjunctivae and EOM are normal.  Neck: Normal range of motion.  Cardiovascular: Normal rate and regular  rhythm.  Exam reveals no gallop and no friction rub.   No murmur heard. Pulmonary/Chest: Effort normal and breath sounds normal. She has no wheezes. She has no rales. She exhibits no tenderness.  Abdominal: Soft. She exhibits no distension. There is no tenderness. There is no rebound and no guarding.  Genitourinary: Vagina normal.  Small amount of white vaginal discharge. Cervical os closed. No CMT. No abdominal tenderness or mass palpated on bimanual exam.   Musculoskeletal: Normal range of motion.  Neurological: She is alert and oriented to person, place, and time. Coordination normal.  Speech is goal-oriented. Moves limbs without ataxia.   Skin: Skin is warm and dry.  Psychiatric: She has a normal mood and affect. Her behavior is normal.    ED Course  Procedures (including critical care time) Labs Review Labs Reviewed  URINALYSIS, ROUTINE W REFLEX MICROSCOPIC - Abnormal; Notable for the following:    APPearance CLOUDY (*)    Hgb urine dipstick MODERATE (*)    Leukocytes, UA SMALL (*)    All other components within normal limits  URINE MICROSCOPIC-ADD ON - Abnormal; Notable for the following:    Squamous Epithelial / LPF FEW (*)    All other components within normal limits  GC/CHLAMYDIA PROBE AMP  WET PREP, GENITAL  PREGNANCY, URINE   Imaging Review No results found.  EKG Interpretation   None       MDM   1. BV (bacterial vaginosis)     1:26 PM Urinalysis shows no infection. Urine preg negative. Pelvic exam done. Wet prep pending. Vitals stable and patient afebrile.   1:52 PM Wet prep shows clue cells. Patient will be treated for BV with flagyl. Vitals stable and patient afebrile. No further evaluation needed at this time. Patient will be contacted within 48 hours if GC/Chlamydia results are positive.   Alvina Chou, PA-C 06/12/13 1359

## 2013-06-12 NOTE — Discharge Instructions (Signed)
Take flagyl as directed until gone. Refer to attached documents for more information.  °

## 2013-06-12 NOTE — ED Notes (Signed)
C/o foul odor vaginal d/c, lower abd pain and lower back pain x 2 weeks

## 2013-06-13 LAB — GC/CHLAMYDIA PROBE AMP
CT Probe RNA: NEGATIVE
GC Probe RNA: NEGATIVE

## 2013-06-20 NOTE — ED Provider Notes (Signed)
Medical screening examination/treatment/procedure(s) were performed by non-physician practitioner and as supervising physician I was immediately available for consultation/collaboration.  EKG Interpretation   None         Sinan Tuch, MD 06/20/13 0654 

## 2013-06-24 IMAGING — US US OB TRANSVAGINAL
1 series · 14 of 28 positions shown · non-contrast
Comparison: 01/10/2011.

CLINICAL DATA: Vaginal bleeding.  Status post abortion on
01/10/2011.

TRANSVAGINAL OB ULTRASOUND
TECHNIQUE: Transvaginal ultrasound was performed for evaluation of
the gestation as well as the maternal uterus and adnexal regions.

[Series 1: us ob transvaginal · 31 acquisitions, 14 frames shown]
[im 2/31]
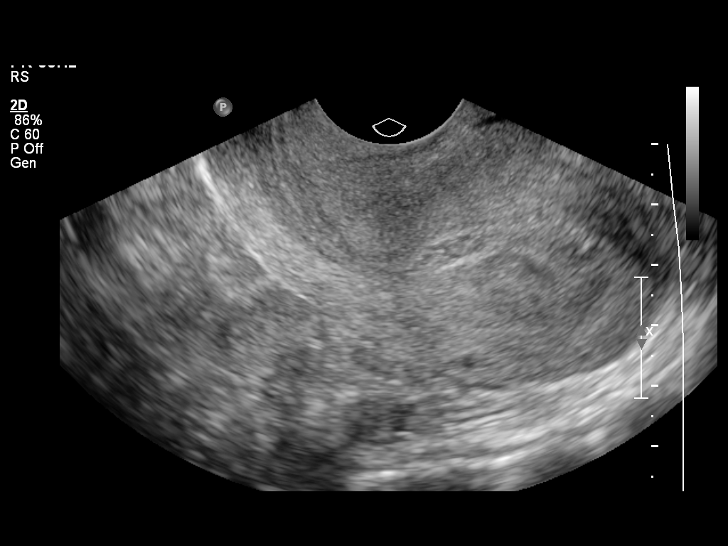
[im 4/31]
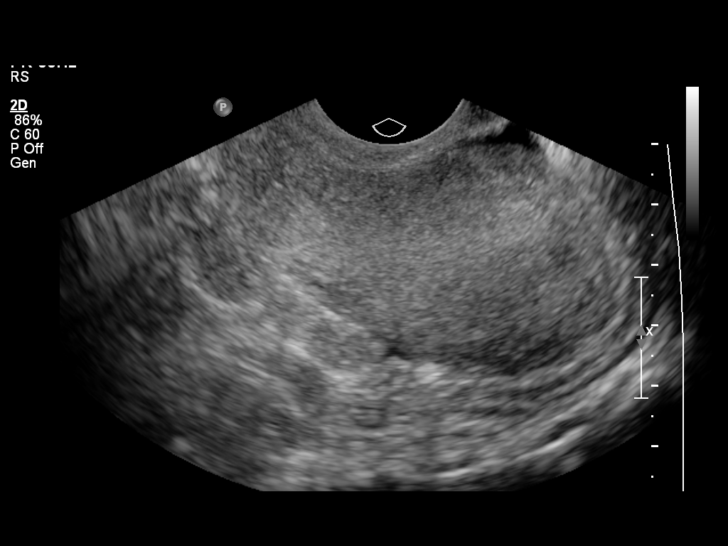
[im 6/31]
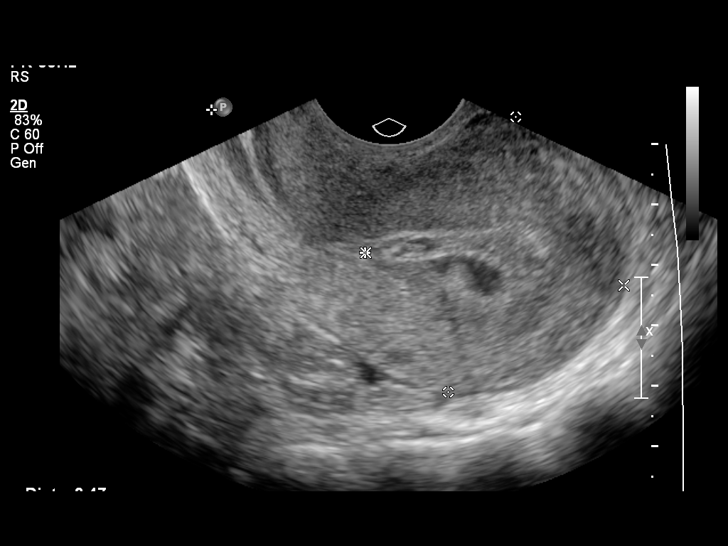
[im 8/31]
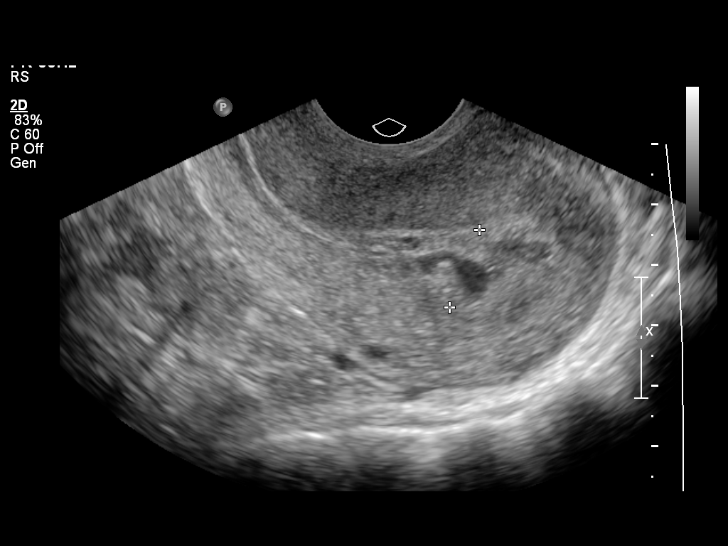
[im 11/31]
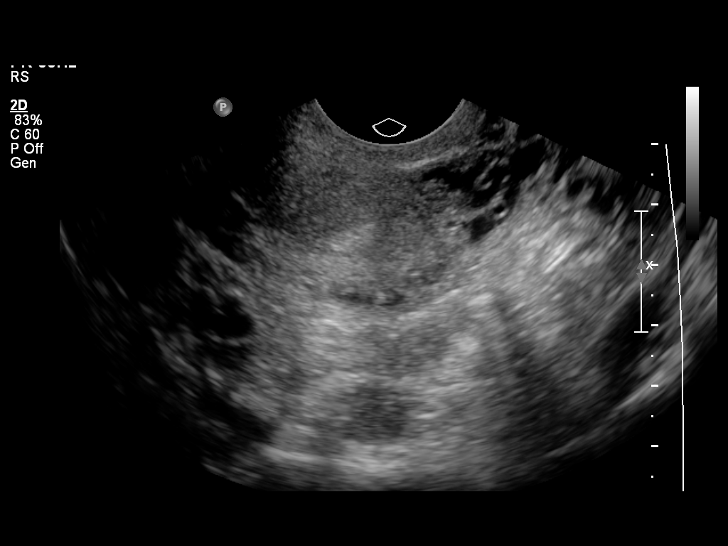
[im 13/31]
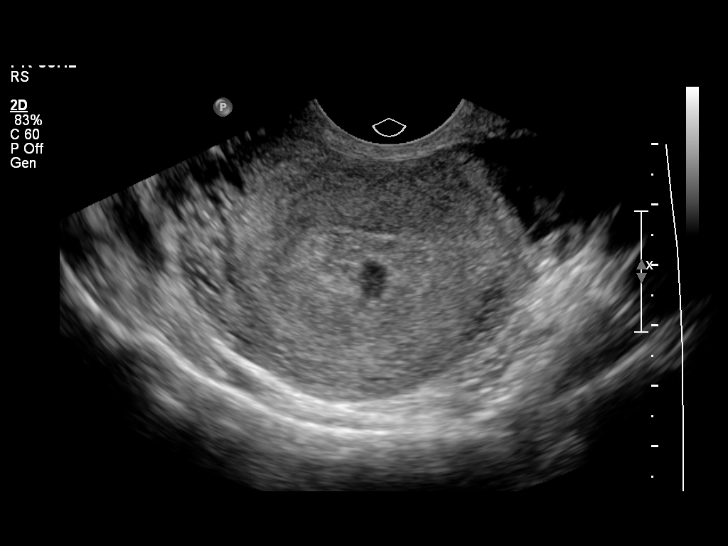
[im 15/31]
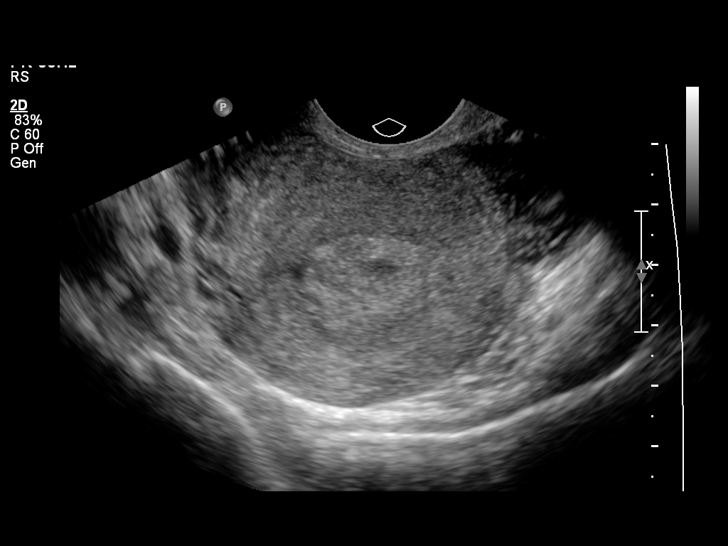
[im 17/31]
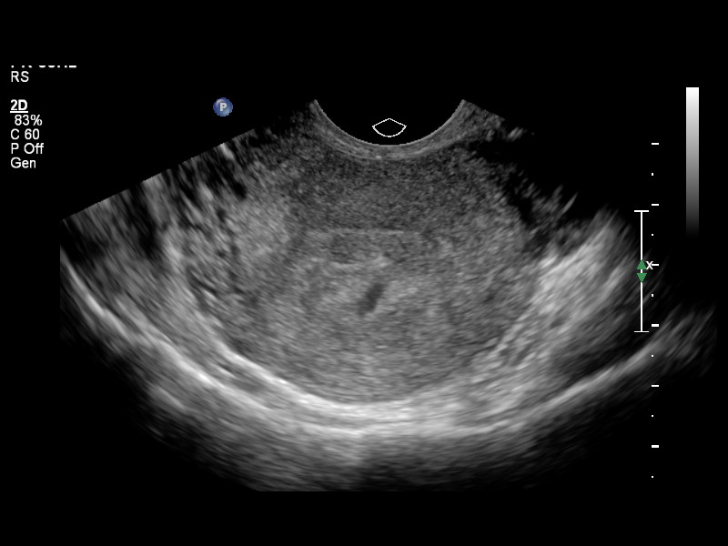
[im 19/31]
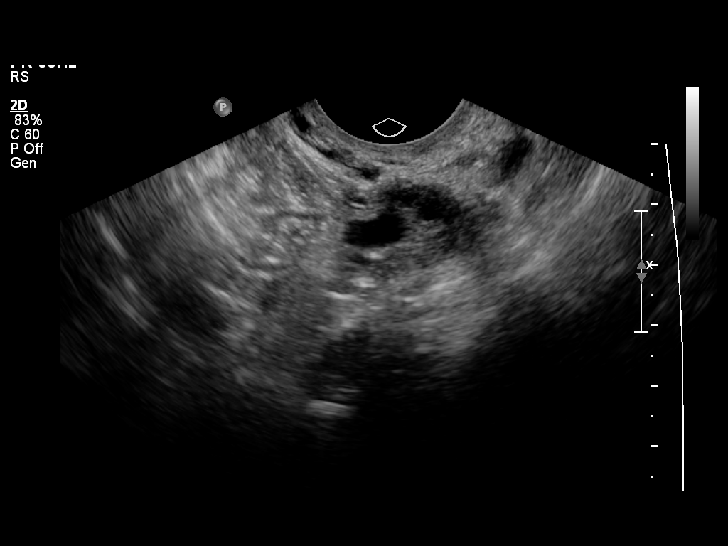
[im 22/31]
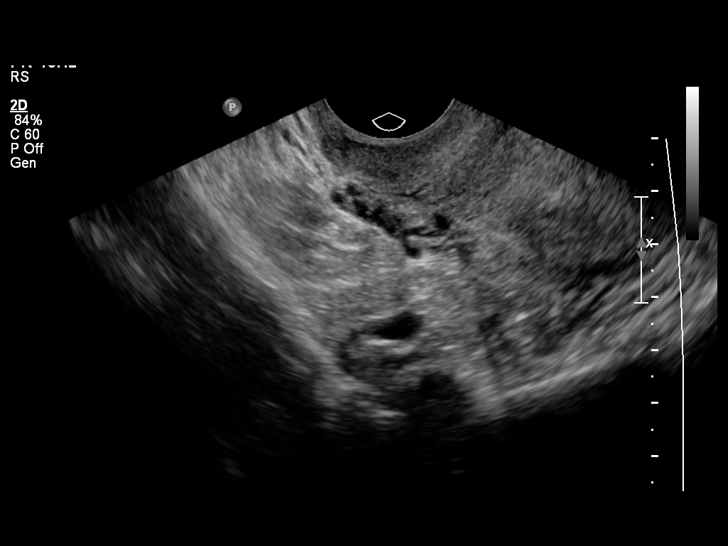
[im 24/31]
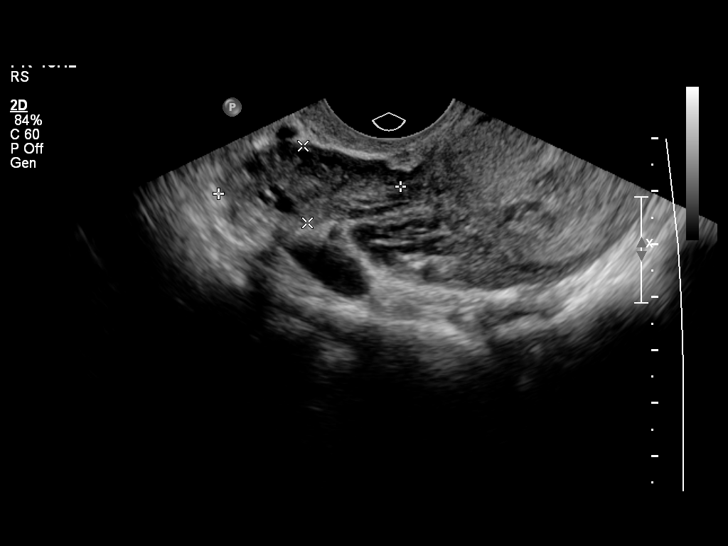
[im 26/31]
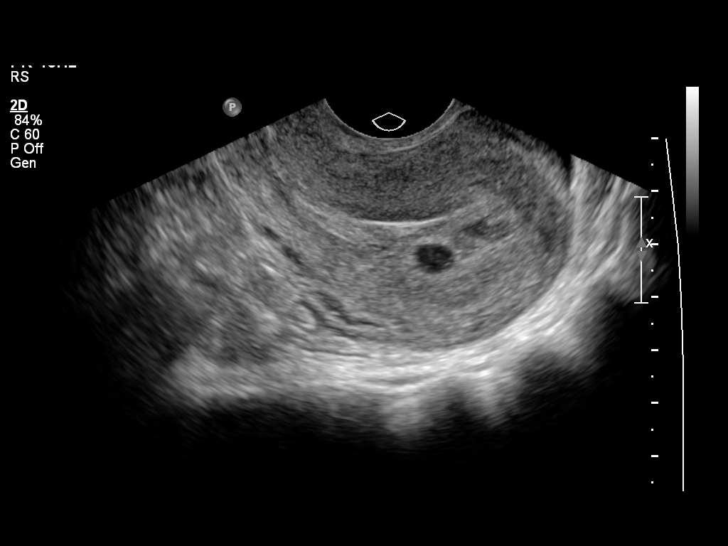
[im 28/31]
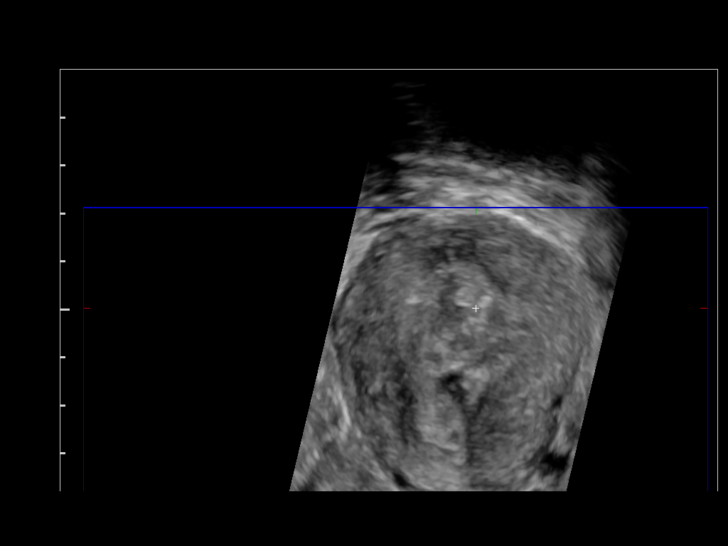
[im 31/31]
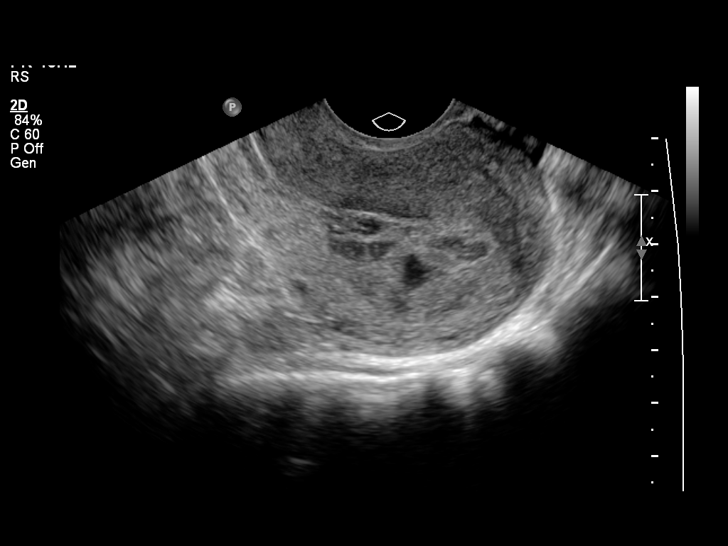

[14 of 28 positions shown; findings below may reference images not displayed]

FINDINGS: Heterogeneous endometrium containing areas of decreased
echogenicity and measuring 13.7 mm in maximum thickness.
Otherwise, normal appearing uterus.  The ovaries have normal
appearances.  No free peritoneal fluid is seen.
IMPRESSION: Thickened, heterogeneous endometrium containing hypoechoic areas.
The hypoechoic areas could represent areas of hemorrhage or
retained products of conception or both.

## 2013-11-09 ENCOUNTER — Other Ambulatory Visit: Payer: Self-pay | Admitting: Gastroenterology

## 2013-11-09 DIAGNOSIS — R109 Unspecified abdominal pain: Secondary | ICD-10-CM

## 2013-11-16 ENCOUNTER — Ambulatory Visit
Admission: RE | Admit: 2013-11-16 | Discharge: 2013-11-16 | Disposition: A | Discharge status: Home / Self Care | Payer: Medicaid Other | Source: Ambulatory Visit | Attending: Gastroenterology | Admitting: Gastroenterology

## 2013-11-16 DIAGNOSIS — R109 Unspecified abdominal pain: Secondary | ICD-10-CM

## 2014-02-12 ENCOUNTER — Encounter (HOSPITAL_BASED_OUTPATIENT_CLINIC_OR_DEPARTMENT_OTHER): Payer: Self-pay | Admitting: Emergency Medicine

## 2014-02-12 ENCOUNTER — Emergency Department (HOSPITAL_BASED_OUTPATIENT_CLINIC_OR_DEPARTMENT_OTHER)
Admission: EM | Admit: 2014-02-12 | Discharge: 2014-02-12 | Disposition: A | Discharge status: Home / Self Care | Payer: Medicaid Other | Attending: Emergency Medicine | Admitting: Emergency Medicine

## 2014-02-12 ENCOUNTER — Emergency Department (HOSPITAL_BASED_OUTPATIENT_CLINIC_OR_DEPARTMENT_OTHER): Payer: Medicaid Other

## 2014-02-12 DIAGNOSIS — J40 Bronchitis, not specified as acute or chronic: Secondary | ICD-10-CM | POA: Insufficient documentation

## 2014-02-12 DIAGNOSIS — R05 Cough: Secondary | ICD-10-CM | POA: Diagnosis present

## 2014-02-12 DIAGNOSIS — Z792 Long term (current) use of antibiotics: Secondary | ICD-10-CM | POA: Insufficient documentation

## 2014-02-12 DIAGNOSIS — Z8659 Personal history of other mental and behavioral disorders: Secondary | ICD-10-CM | POA: Insufficient documentation

## 2014-02-12 DIAGNOSIS — Z8719 Personal history of other diseases of the digestive system: Secondary | ICD-10-CM | POA: Insufficient documentation

## 2014-02-12 DIAGNOSIS — Z79899 Other long term (current) drug therapy: Secondary | ICD-10-CM | POA: Diagnosis not present

## 2014-02-12 DIAGNOSIS — R059 Cough, unspecified: Secondary | ICD-10-CM | POA: Diagnosis present

## 2014-02-12 DIAGNOSIS — Z87440 Personal history of urinary (tract) infections: Secondary | ICD-10-CM | POA: Insufficient documentation

## 2014-02-12 DIAGNOSIS — Z8619 Personal history of other infectious and parasitic diseases: Secondary | ICD-10-CM | POA: Insufficient documentation

## 2014-02-12 DIAGNOSIS — Z862 Personal history of diseases of the blood and blood-forming organs and certain disorders involving the immune mechanism: Secondary | ICD-10-CM | POA: Insufficient documentation

## 2014-02-12 DIAGNOSIS — F17200 Nicotine dependence, unspecified, uncomplicated: Secondary | ICD-10-CM | POA: Insufficient documentation

## 2014-02-12 MED ORDER — AZITHROMYCIN 250 MG PO TABS: 250.0000 mg | ORAL_TABLET | Freq: Every day | ORAL | Status: DC

## 2014-02-12 NOTE — ED Notes (Signed)
Fever, cough with green sputum, chills, vomited x 1 last night. Sore throat. 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

## 2014-02-12 NOTE — ED Provider Notes (Signed)
CSN: [id number]     Arrival date & time 02/12/14  1613 History   First MD Initiated Contact with Patient 02/12/14 1721     Chief Complaint  Patient presents with  . Cough     (Consider location/radiation/quality/duration/timing/severity/associated sxs/prior Treatment) Patient is a 28 y.o. female presenting with cough. The history is provided by the patient. No language interpreter was used.  Cough Cough characteristics:  Productive Sputum characteristics:  Nondescript Severity:  Moderate Onset quality:  Gradual Duration:  1 day Timing:  Constant Progression:  Worsening Chronicity:  New Smoker: yes   Context: sick contacts   Relieved by:  Nothing Worsened by:  Activity Ineffective treatments:  None tried Associated symptoms: chest pain   Risk factors: no recent infection     Past Medical History  Diagnosis Date  . Anemia   . Headache(784.0)   . Urinary tract infection   . Abnormal Pap smear   . Human papilloma virus     cells removed x2  . Chlamydia   . Trichomonas   . GERD (gastroesophageal reflux disease)   . Spinal headache   . Anxiety    Past Surgical History  Procedure Laterality Date  . Dilation and curettage of uterus    . Therapeutic abortion    . Pilonidal cyst excision    . Gynecologic cryosurgery     Family History  Problem Relation Age of Onset  . Hypertension Mother   . Fibroids Mother   . Cancer Maternal Grandfather    History  Substance Use Topics  . Smoking status: Current Every Day Smoker -- 0.25 packs/day for 14 years  . Smokeless tobacco: Never Used  . Alcohol Use: Yes     Comment:  socially   OB History   Grav Para Term Preterm Abortions TAB SAB Ect Mult Living   7 3 3  0 4 4 0 0 0 3     Review of Systems  Respiratory: Positive for cough.   Cardiovascular: Positive for chest pain.  All other systems reviewed and are negative.     Allergies  Review of patient's allergies indicates no known allergies.  Home Medications    Prior to Admission medications   Medication Sig Start Date End Date Taking? Authorizing Provider  amoxicillin (AMOXIL) 500 MG capsule Take 1 capsule (500 mg total) by mouth 3 (three) times daily. 04/15/13   Marrion Coy, MD  fluconazole (DIFLUCAN) 200 MG tablet Take if you develop vaginal itching. 10/17/12   Mylinda Latina, MD  HYDROcodone-acetaminophen (NORCO) 5-325 MG per tablet Take 1 tablet by mouth every 6 (six) hours as needed for pain. 09/17/12   Melony Overly, MD  metroNIDAZOLE (FLAGYL) 500 MG tablet Take 1 tablet (500 mg total) by mouth 2 (two) times daily. 10/17/12   Mylinda Latina, MD  metroNIDAZOLE (FLAGYL) 500 MG tablet Take 1 tablet (500 mg total) by mouth 2 (two) times daily. 06/12/13   Alvina Chou, PA-C  norethindrone-ethinyl estradiol (TRIPHASIL,CYCLAFEM,ALYACEN) 0.5/0.75/1-35 MG-MCG tablet Take 1 tablet by mouth daily.    Historical Provider, MD  ondansetron (ZOFRAN) 4 MG tablet Take 1 tablet (4 mg total) by mouth every 8 (eight) hours as needed for nausea. 09/17/12   Melony Overly, MD   BP 111/90  Pulse 91  Temp(Src) 99.2 F (37.3 C) (Oral)  Resp 20  Ht 5' (1.524 m)  Wt 130 lb (58.968 kg)  BMI 25.39 kg/m2  SpO2 100%  LMP 02/07/2014 Physical Exam  Nursing note and vitals reviewed. Constitutional: She  is oriented to person, place, and time. She appears well-developed and well-nourished.  HENT:  Head: Normocephalic.  Eyes: EOM are normal. Pupils are equal, round, and reactive to light.  Neck: Normal range of motion.  Cardiovascular: Normal rate and regular rhythm.   Pulmonary/Chest: Effort normal.  Abdominal: Soft. She exhibits no distension.  Musculoskeletal: Normal range of motion.  Neurological: She is alert and oriented to person, place, and time.  Skin: Skin is warm.  Psychiatric: She has a normal mood and affect.    ED Course  Procedures (including critical care time) Labs Review Labs Reviewed - No data to display  Imaging Review Dg Chest 2  View  02/12/2014   CLINICAL DATA:  Cough and nasal congestion for 2 days.  EXAM: CHEST  2 VIEW  COMPARISON:  09/23/2008  FINDINGS: The heart size and mediastinal contours are within normal limits. Both lungs are clear. The visualized skeletal structures are unremarkable.  IMPRESSION: No active cardiopulmonary disease.   Electronically Signed   By: Sherryl Barters M.D.   On: 02/12/2014 16:40     EKG Interpretation None      MDM   Final diagnoses:  Bronchitis    zithromax rx Continue otc cough medication    Fransico Meadow, PA-C 02/12/14 1738

## 2014-02-12 NOTE — ED Provider Notes (Signed)
Medical screening examination/treatment/procedure(s) were performed by non-physician practitioner and as supervising physician I was immediately available for consultation/collaboration.    Dorie Rank, MD 02/12/14 1740

## 2014-02-12 NOTE — Discharge Instructions (Signed)

## 2014-04-05 ENCOUNTER — Encounter (HOSPITAL_BASED_OUTPATIENT_CLINIC_OR_DEPARTMENT_OTHER): Payer: Self-pay | Admitting: Emergency Medicine

## 2014-05-17 ENCOUNTER — Inpatient Hospital Stay (HOSPITAL_COMMUNITY)
Admission: AD | Admit: 2014-05-17 | Discharge: 2014-05-17 | Disposition: A | Discharge status: Home / Self Care | Payer: Medicaid Other | Source: Ambulatory Visit | Attending: Obstetrics | Admitting: Obstetrics

## 2014-05-17 ENCOUNTER — Encounter (HOSPITAL_COMMUNITY): Payer: Self-pay | Admitting: *Deleted

## 2014-05-17 DIAGNOSIS — N939 Abnormal uterine and vaginal bleeding, unspecified: Secondary | ICD-10-CM | POA: Insufficient documentation

## 2014-05-17 DIAGNOSIS — F17210 Nicotine dependence, cigarettes, uncomplicated: Secondary | ICD-10-CM | POA: Insufficient documentation

## 2014-05-17 DIAGNOSIS — K219 Gastro-esophageal reflux disease without esophagitis: Secondary | ICD-10-CM | POA: Insufficient documentation

## 2014-05-17 HISTORY — DX: Unspecified abnormal cytological findings in specimens from vagina: R87.629

## 2014-05-17 LAB — URINE MICROSCOPIC-ADD ON

## 2014-05-17 LAB — CBC
HCT: 34.1 % — ABNORMAL LOW (ref 36.0–46.0)
Hemoglobin: 11.1 g/dL — ABNORMAL LOW (ref 12.0–15.0)
MCH: 28.8 pg (ref 26.0–34.0)
MCHC: 32.6 g/dL (ref 30.0–36.0)
MCV: 88.6 fL (ref 78.0–100.0)
Platelets: 240 10*3/uL (ref 150–400)
RBC: 3.85 MIL/uL — ABNORMAL LOW (ref 3.87–5.11)
RDW: 12.9 % (ref 11.5–15.5)
WBC: 5.5 10*3/uL (ref 4.0–10.5)

## 2014-05-17 LAB — URINALYSIS, ROUTINE W REFLEX MICROSCOPIC
Bilirubin Urine: NEGATIVE
Glucose, UA: NEGATIVE mg/dL
KETONES UR: NEGATIVE mg/dL
Leukocytes, UA: NEGATIVE
Nitrite: NEGATIVE
Protein, ur: NEGATIVE mg/dL
Urobilinogen, UA: 0.2 mg/dL (ref 0.0–1.0)
pH: 5.5 (ref 5.0–8.0)

## 2014-05-17 LAB — WET PREP, GENITAL
Clue Cells Wet Prep HPF POC: NONE SEEN
TRICH WET PREP: NONE SEEN
WBC, Wet Prep HPF POC: NONE SEEN
Yeast Wet Prep HPF POC: NONE SEEN

## 2014-05-17 LAB — POCT PREGNANCY, URINE: Preg Test, Ur: NEGATIVE

## 2014-05-17 NOTE — Discharge Instructions (Signed)
Abnormal Uterine Bleeding Abnormal uterine bleeding can affect women at various stages in life, including teenagers, women in their reproductive years, pregnant women, and women who have reached menopause. Several kinds of uterine bleeding are considered abnormal, including:  Bleeding or spotting between periods.   Bleeding after sexual intercourse.   Bleeding that is heavier or more than normal.   Periods that last longer than usual.  Bleeding after menopause.  Many cases of abnormal uterine bleeding are minor and simple to treat, while others are more serious. Any type of abnormal bleeding should be evaluated by your health care provider. Treatment will depend on the cause of the bleeding. HOME CARE INSTRUCTIONS Monitor your condition for any changes. The following actions may help to alleviate any discomfort you are experiencing:  Avoid the use of tampons and douches as directed by your health care provider.  Change your pads frequently. You should get regular pelvic exams and Pap tests. Keep all follow-up appointments for diagnostic tests as directed by your health care provider.  SEEK MEDICAL CARE IF:   Your bleeding lasts more than 1 week.   You feel dizzy at times.  SEEK IMMEDIATE MEDICAL CARE IF:   You pass out.   You are changing pads every 15 to 30 minutes.   You have abdominal pain.  You have a fever.   You become sweaty or weak.   You are passing large blood clots from the vagina.   You start to feel nauseous and vomit. MAKE SURE YOU:   Understand these instructions.  Will watch your condition.  Will get help right away if you are not doing well or get worse. Document Released: 05/21/2005 Document Revised: 05/26/2013 Document Reviewed: 12/18/2012 [employer] Patient Information 2015 [employer]. This information is not intended to replace advice given to you by your health care provider. Make sure you discuss any questions you have with your  health care provider.  

## 2014-05-17 NOTE — MAU Provider Note (Signed)
History     CSN: [id number]  Arrival date and time: 05/17/14 1131   First Provider Initiated Contact with Patient 05/17/14 1335      Chief Complaint  Patient presents with  . Shortness of Breath  . Dizziness  . Vaginal Bleeding   HPI   [name] is a 28 y.o. female [phone] who presents with abnormal vaginal bleeding. The bleeding started yesterday as spotting and worsening over the night. The patient complains of off and on SOB, however denies any currently. She feels like she may be pregnant based on the symptoms she is experiencing.    RN note: Patient presents with complaints of SOB, dizziness, abdominal pain and vaginal bleeding. Possible pregnancy.   OB History    Gravida Para Term Preterm AB TAB SAB Ectopic Multiple Living   7 3 3  0 4 4 0 0 0 3      Past Medical History  Diagnosis Date  . Anemia   . Headache(784.0)   . Urinary tract infection   . Abnormal Pap smear   . Human papilloma virus     cells removed x2  . Chlamydia   . Trichomonas   . GERD (gastroesophageal reflux disease)   . Spinal headache   . Anxiety   . Vaginal Pap smear, abnormal     Past Surgical History  Procedure Laterality Date  . Dilation and curettage of uterus    . Therapeutic abortion    . Pilonidal cyst excision    . Gynecologic cryosurgery      Family History  Problem Relation Age of Onset  . Hypertension Mother   . Fibroids Mother   . Cancer Maternal Grandfather     History  Substance Use Topics  . Smoking status: Current Every Day Smoker -- 0.25 packs/day for 14 years    Types: Cigarettes  . Smokeless tobacco: Never Used  . Alcohol Use: Yes     Comment:  socially    Allergies: No Known Allergies  Prescriptions prior to admission  Medication Sig Dispense Refill Last Dose  . Norgestimate-Ethinyl Estradiol Triphasic (TRINESSA, 28,) 0.18/0.215/0.25 MG-35 MCG tablet Take 1 tablet by mouth daily.   05/14/2014  . amoxicillin (AMOXIL) 500 MG capsule Take 1  capsule (500 mg total) by mouth 3 (three) times daily. (Patient not taking: Reported on 05/17/2014) 21 capsule 0   . azithromycin (ZITHROMAX) 250 MG tablet Take 1 tablet (250 mg total) by mouth daily. Take first 2 tablets together, then 1 every day until finished. (Patient not taking: Reported on 05/17/2014) 6 tablet 0 Completed Course at Unknown time  . fluconazole (DIFLUCAN) 200 MG tablet Take if you develop vaginal itching. (Patient not taking: Reported on 05/17/2014) 1 tablet 0   . HYDROcodone-acetaminophen (NORCO) 5-325 MG per tablet Take 1 tablet by mouth every 6 (six) hours as needed for pain. (Patient not taking: Reported on 05/17/2014) 15 tablet 0   . metroNIDAZOLE (FLAGYL) 500 MG tablet Take 1 tablet (500 mg total) by mouth 2 (two) times daily. (Patient not taking: Reported on 05/17/2014) 14 tablet 0   . metroNIDAZOLE (FLAGYL) 500 MG tablet Take 1 tablet (500 mg total) by mouth 2 (two) times daily. (Patient not taking: Reported on 05/17/2014) 14 tablet 0   . norethindrone-ethinyl estradiol (TRIPHASIL,CYCLAFEM,ALYACEN) 0.5/0.75/1-35 MG-MCG tablet Take 1 tablet by mouth daily.   04/13/13  . ondansetron (ZOFRAN) 4 MG tablet Take 1 tablet (4 mg total) by mouth every 8 (eight) hours as needed for nausea. (Patient  not taking: Reported on 05/17/2014) 20 tablet 0    Results for orders placed or performed during the hospital encounter of 05/17/14 (from the past 48 hour(s))  Urinalysis, Routine w reflex microscopic     Status: Abnormal   Collection Time: 05/17/14 11:30 AM  Result Value Ref Range   Color, Urine YELLOW YELLOW   APPearance CLEAR CLEAR   Specific Gravity, Urine >1.030 (H) 1.005 - 1.030   pH 5.5 5.0 - 8.0   Glucose, UA NEGATIVE NEGATIVE mg/dL   Hgb urine dipstick LARGE (A) NEGATIVE   Bilirubin Urine NEGATIVE NEGATIVE   Ketones, ur NEGATIVE NEGATIVE mg/dL   Protein, ur NEGATIVE NEGATIVE mg/dL   Urobilinogen, UA 0.2 0.0 - 1.0 mg/dL   Nitrite NEGATIVE NEGATIVE   Leukocytes, UA  NEGATIVE NEGATIVE  Urine microscopic-add on     Status: Abnormal   Collection Time: 05/17/14 11:30 AM  Result Value Ref Range   Squamous Epithelial / LPF MANY (A) RARE   WBC, UA 3-6 <3 WBC/hpf   RBC / HPF 0-2 <3 RBC/hpf   Bacteria, UA MANY (A) RARE   Urine-Other MUCOUS PRESENT   Pregnancy, urine POC     Status: None   Collection Time: 05/17/14 11:55 AM  Result Value Ref Range   Preg Test, Ur NEGATIVE NEGATIVE    Comment:        THE SENSITIVITY OF THIS METHODOLOGY IS >24 mIU/mL   CBC     Status: Abnormal   Collection Time: 05/17/14  1:40 PM  Result Value Ref Range   WBC 5.5 4.0 - 10.5 K/uL   RBC 3.85 (L) 3.87 - 5.11 MIL/uL   Hemoglobin 11.1 (L) 12.0 - 15.0 g/dL   HCT 34.1 (L) 36.0 - 46.0 %   MCV 88.6 78.0 - 100.0 fL   MCH 28.8 26.0 - 34.0 pg   MCHC 32.6 30.0 - 36.0 g/dL   RDW 12.9 11.5 - 15.5 %   Platelets 240 150 - 400 K/uL  Wet prep, genital     Status: None   Collection Time: 05/17/14  2:15 PM  Result Value Ref Range   Yeast Wet Prep HPF POC NONE SEEN NONE SEEN   Trich, Wet Prep NONE SEEN NONE SEEN   Clue Cells Wet Prep HPF POC NONE SEEN NONE SEEN   WBC, Wet Prep HPF POC NONE SEEN NONE SEEN    Review of Systems  Gastrointestinal: Positive for abdominal pain and diarrhea (Lower abdominal pain; mild).  Neurological: Negative for dizziness.   Physical Exam   Blood pressure 118/69, pulse 74, temperature 98.7 F (37.1 C), temperature source Oral, resp. rate 16, height 5' (1.524 m), weight 57.607 kg (127 lb), last menstrual period 04/25/2014, SpO2 100 %.  Physical Exam  Constitutional: She is oriented to person, place, and time. She appears well-developed and well-nourished. No distress.  HENT:  Head: Normocephalic.  Eyes: Pupils are equal, round, and reactive to light.  Neck: Neck supple.  Cardiovascular: Normal rate and normal heart sounds.   Respiratory: Effort normal and breath sounds normal. No respiratory distress. She has no wheezes. She has no rales.  GI:  Soft. She exhibits no distension. There is no tenderness. There is no rebound and no guarding.  Genitourinary:  Speculum exam: Vagina - Small amount of dark, red blood in vaginal canal. Cervix - + scant amount of dark, active bleeding for the OS.  Bimanual exam: Cervix closed, no CMT  Uterus non tender, normal size Adnexa non tender, no masses bilaterally  GC/Chlam, wet prep done Chaperone present for exam.   Musculoskeletal: Normal range of motion.  Neurological: She is alert and oriented to person, place, and time.  Skin: Skin is warm. She is not diaphoretic.  Psychiatric: Her behavior is normal.    MAU Course  Procedures  None  MDM CBC Orthostatic vital signs; normal  Upt negative   Assessment and Plan   A: 1. Abnormal vaginal bleeding    P: Discharge home in stable condition Follow up with Dr. Ruthann Cancer  Bleeding precautions Return to MAU for emergencies   Darrelyn Hillock Rasch, NP 05/17/2014 4:21 PM

## 2014-05-17 NOTE — MAU Note (Signed)
Patient presents with complaints of SOB, dizziness, abdominal pain and vaginal bleeding. Possible pregnancy.

## 2014-05-18 LAB — GC/CHLAMYDIA PROBE AMP
CT Probe RNA: NEGATIVE
GC Probe RNA: NEGATIVE

## 2014-06-01 ENCOUNTER — Other Ambulatory Visit: Payer: Self-pay | Admitting: Gastroenterology

## 2014-06-01 DIAGNOSIS — D1803 Hemangioma of intra-abdominal structures: Secondary | ICD-10-CM

## 2014-06-03 ENCOUNTER — Encounter (HOSPITAL_COMMUNITY): Payer: Self-pay

## 2014-06-03 ENCOUNTER — Emergency Department (HOSPITAL_COMMUNITY)
Admission: EM | Admit: 2014-06-03 | Discharge: 2014-06-03 | Disposition: A | Discharge status: Home / Self Care | Payer: Medicaid Other | Attending: Emergency Medicine | Admitting: Emergency Medicine

## 2014-06-03 ENCOUNTER — Emergency Department (HOSPITAL_COMMUNITY): Payer: Medicaid Other

## 2014-06-03 DIAGNOSIS — L03312 Cellulitis of back [any part except buttock]: Secondary | ICD-10-CM | POA: Insufficient documentation

## 2014-06-03 DIAGNOSIS — Z793 Long term (current) use of hormonal contraceptives: Secondary | ICD-10-CM | POA: Insufficient documentation

## 2014-06-03 DIAGNOSIS — Z3202 Encounter for pregnancy test, result negative: Secondary | ICD-10-CM | POA: Insufficient documentation

## 2014-06-03 DIAGNOSIS — Z8719 Personal history of other diseases of the digestive system: Secondary | ICD-10-CM | POA: Insufficient documentation

## 2014-06-03 DIAGNOSIS — Z862 Personal history of diseases of the blood and blood-forming organs and certain disorders involving the immune mechanism: Secondary | ICD-10-CM | POA: Insufficient documentation

## 2014-06-03 DIAGNOSIS — Z8619 Personal history of other infectious and parasitic diseases: Secondary | ICD-10-CM | POA: Diagnosis not present

## 2014-06-03 DIAGNOSIS — Z8659 Personal history of other mental and behavioral disorders: Secondary | ICD-10-CM | POA: Insufficient documentation

## 2014-06-03 DIAGNOSIS — R079 Chest pain, unspecified: Secondary | ICD-10-CM | POA: Insufficient documentation

## 2014-06-03 DIAGNOSIS — L02212 Cutaneous abscess of back [any part, except buttock]: Secondary | ICD-10-CM | POA: Diagnosis not present

## 2014-06-03 DIAGNOSIS — L0291 Cutaneous abscess, unspecified: Secondary | ICD-10-CM

## 2014-06-03 DIAGNOSIS — Z720 Tobacco use: Secondary | ICD-10-CM | POA: Diagnosis not present

## 2014-06-03 DIAGNOSIS — L0390 Cellulitis, unspecified: Secondary | ICD-10-CM

## 2014-06-03 DIAGNOSIS — R002 Palpitations: Secondary | ICD-10-CM | POA: Insufficient documentation

## 2014-06-03 DIAGNOSIS — N390 Urinary tract infection, site not specified: Secondary | ICD-10-CM | POA: Diagnosis not present

## 2014-06-03 LAB — URINE MICROSCOPIC-ADD ON

## 2014-06-03 LAB — COMPREHENSIVE METABOLIC PANEL
ALT: 12 U/L (ref 0–35)
AST: 16 U/L (ref 0–37)
Albumin: 4 g/dL (ref 3.5–5.2)
Alkaline Phosphatase: 44 U/L (ref 39–117)
Anion gap: 8 (ref 5–15)
BUN: 12 mg/dL (ref 6–23)
CO2: 23 mmol/L (ref 19–32)
CREATININE: 0.66 mg/dL (ref 0.50–1.10)
Calcium: 8.8 mg/dL (ref 8.4–10.5)
Chloride: 109 mEq/L (ref 96–112)
GFR calc Af Amer: >90 mL/min (ref >90)
Glucose, Bld: 81 mg/dL (ref 70–99)
POTASSIUM: 4.3 mmol/L (ref 3.5–5.1)
SODIUM: 140 mmol/L (ref 135–145)
Total Bilirubin: 0.4 mg/dL (ref 0.3–1.2)
Total Protein: 7 g/dL (ref 6.0–8.3)

## 2014-06-03 LAB — URINALYSIS, ROUTINE W REFLEX MICROSCOPIC
BILIRUBIN URINE: NEGATIVE
Glucose, UA: NEGATIVE mg/dL
KETONES UR: NEGATIVE mg/dL
Leukocytes, UA: NEGATIVE
NITRITE: NEGATIVE
Protein, ur: NEGATIVE mg/dL
Specific Gravity, Urine: 1.024 (ref 1.005–1.030)
Urobilinogen, UA: 0.2 mg/dL (ref 0.0–1.0)
pH: 5 (ref 5.0–8.0)

## 2014-06-03 LAB — CBC WITH DIFFERENTIAL/PLATELET
Basophils Absolute: 0 10*3/uL (ref 0.0–0.1)
Basophils Relative: 0 % (ref 0–1)
EOS ABS: 0.1 10*3/uL (ref 0.0–0.7)
EOS PCT: 1 % (ref 0–5)
HEMATOCRIT: 33.6 % — AB (ref 36.0–46.0)
Hemoglobin: 10.9 g/dL — ABNORMAL LOW (ref 12.0–15.0)
LYMPHS ABS: 2.5 10*3/uL (ref 0.7–4.0)
Lymphocytes Relative: 28 % (ref 12–46)
MCH: 28.8 pg (ref 26.0–34.0)
MCHC: 32.4 g/dL (ref 30.0–36.0)
MCV: 88.9 fL (ref 78.0–100.0)
Monocytes Absolute: 0.6 10*3/uL (ref 0.1–1.0)
Monocytes Relative: 7 % (ref 3–12)
Neutro Abs: 5.7 10*3/uL (ref 1.7–7.7)
Neutrophils Relative %: 64 % (ref 43–77)
Platelets: 277 10*3/uL (ref 150–400)
RBC: 3.78 MIL/uL — ABNORMAL LOW (ref 3.87–5.11)
RDW: 13 % (ref 11.5–15.5)
WBC: 8.9 10*3/uL (ref 4.0–10.5)

## 2014-06-03 LAB — I-STAT TROPONIN, ED: Troponin i, poc: 0 ng/mL (ref 0.00–0.08)

## 2014-06-03 LAB — POC URINE PREG, ED: PREG TEST UR: NEGATIVE

## 2014-06-03 LAB — LIPASE, BLOOD: LIPASE: 20 U/L (ref 11–59)

## 2014-06-03 MED ORDER — SULFAMETHOXAZOLE-TRIMETHOPRIM 800-160 MG PO TABS: 1.0000 | ORAL_TABLET | Freq: Two times a day (BID) | ORAL | Status: AC

## 2014-06-03 MED ORDER — LIDOCAINE-EPINEPHRINE 2 %-1:100000 IJ SOLN
20.0000 mL | Freq: Once | INTRAMUSCULAR | Status: AC
  Administered 2014-06-03: 1 mL via INTRADERMAL
  Filled 2014-06-03: qty 1

## 2014-06-03 MED ORDER — SULFAMETHOXAZOLE-TRIMETHOPRIM 800-160 MG PO TABS
1.0000 | ORAL_TABLET | Freq: Once | ORAL | Status: AC
Start: 2014-06-03 — End: 2014-06-03
  Administered 2014-06-03: 1 via ORAL
  Filled 2014-06-03: qty 1

## 2014-06-03 MED ORDER — IBUPROFEN 800 MG PO TABS
800.0000 mg | ORAL_TABLET | Freq: Once | ORAL | Status: AC
  Administered 2014-06-03: 800 mg via ORAL
  Filled 2014-06-03: qty 1

## 2014-06-03 MED ORDER — IBUPROFEN 800 MG PO TABS: 800.0000 mg | ORAL_TABLET | Freq: Three times a day (TID) | ORAL | Status: DC | PRN

## 2014-06-03 NOTE — ED Provider Notes (Signed)
TIME SEEN: 10:50 AM  CHIEF COMPLAINT: Chest pain, abdominal pain, abscess  HPI: Pt is a 28 y.o. F with history of anemia, prior abscess who presents to the emergency department with multiple complaints. She reports she's had chest tightness and palpitations for the past 6 months. No associate shortness of breath, diaphoresis or dizziness. Pain is not exertional or pleuritic. She has had a dry cough for several days. No history of PE or DVT. No history of CAD. No family history of CAD. She states she has seen a cardiologist in the past when she was pregnant but that was 2 years ago.   She is also complaining of throbbing lower abdominal pain for the past 2 months. She has seen her OB/GYN for this and states that there workup was negative. Denies fevers, chills, nausea, vomiting or diarrhea, dysuria or hematuria, vaginal bleeding or discharge.   Patient also complaining of an abscess to her right back that is tender to palpation for the past 2 days. No drainage.  ROS: See HPI Constitutional: no fever  Eyes: no drainage  ENT: no runny nose   Cardiovascular:   chest pain  Resp: no SOB  GI: no vomiting GU: no dysuria Integumentary: no rash  Allergy: no hives  Musculoskeletal: no leg swelling  Neurological: no slurred speech ROS otherwise negative  PAST MEDICAL HISTORY/PAST SURGICAL HISTORY:  Past Medical History  Diagnosis Date  . Anemia   . Headache(784.0)   . Urinary tract infection   . Abnormal Pap smear   . Human papilloma virus     cells removed x2  . Chlamydia   . Trichomonas   . GERD (gastroesophageal reflux disease)   . Spinal headache   . Anxiety   . Vaginal Pap smear, abnormal     MEDICATIONS:  Prior to Admission medications   Medication Sig Start Date End Date Taking? Authorizing Provider  norethindrone-ethinyl estradiol (TRIPHASIL,CYCLAFEM,ALYACEN) 0.5/0.75/1-35 MG-MCG tablet Take 1 tablet by mouth daily.    Historical Provider, MD  Norgestimate-Ethinyl Estradiol  Triphasic (TRINESSA, 28,) 0.18/0.215/0.25 MG-35 MCG tablet Take 1 tablet by mouth daily.    Historical Provider, MD  ondansetron (ZOFRAN) 4 MG tablet Take 1 tablet (4 mg total) by mouth every 8 (eight) hours as needed for nausea. Patient not taking: Reported on 05/17/2014 09/17/12   Melony Overly, MD    ALLERGIES:  No Known Allergies  SOCIAL HISTORY:  History  Substance Use Topics  . Smoking status: Current Every Day Smoker -- 0.25 packs/day for 14 years    Types: Cigarettes  . Smokeless tobacco: Never Used  . Alcohol Use: Yes     Comment:  socially    FAMILY HISTORY: Family History  Problem Relation Age of Onset  . Hypertension Mother   . Fibroids Mother   . Cancer Maternal Grandfather     EXAM: BP 111/77 mmHg  Pulse 83  Temp(Src) 98.2 F (36.8 C) (Oral)  Resp 20  Ht 5' (1.524 m)  Wt 127 lb (57.607 kg)  BMI 24.80 kg/m2  SpO2 99%  LMP 06/01/2014 CONSTITUTIONAL: Alert and oriented and responds appropriately to questions. Well-appearing; well-nourished; in no distress HEAD: Normocephalic EYES: Conjunctivae clear, PERRL ENT: normal nose; no rhinorrhea; moist mucous membranes; pharynx without lesions noted NECK: Supple, no meningismus, no LAD  CARD: RRR; S1 and S2 appreciated; no murmurs, no clicks, no rubs, no gallops RESP: Normal chest excursion without splinting or tachypnea; breath sounds clear and equal bilaterally; no wheezes, no rhonchi, no rales, no hypoxia  or respiratory distress, chest wall is nontender to palpation ABD/GI: Normal bowel sounds; non-distended; soft, non-tender, no rebound, no guarding, no peritoneal signs BACK:  The back appears normal and is non-tender to palpation, there is no CVA tenderness; patient has a 3 x 2 cm raised erythematous warm area with fluctuance and no drainage to the right upper back EXT: Normal ROM in all joints; non-tender to palpation; no edema; normal capillary refill; no cyanosis    SKIN: Normal color for age and race;  warm NEURO: Moves all extremities equally PSYCH: The patient's mood and manner are appropriate. Grooming and personal hygiene are appropriate.  MEDICAL DECISION MAKING: Patient here with multiple complaints. She is complaining of chest pain for the past 6 months of palpitations. EKG shows no ischemic changes and is unchanged compared to prior EKGs. She is PERC negative. Has a heart score of 1 (tobacco use).  Also complaining of abdominal pain but has a benign abdominal exam. Will obtain abdominal labs and urine. Reports she is seeing her OB/GYN for her lower abdominal pain and has a normal workup per her report. States she has had a pelvic exam and ultrasound. No vaginal bleeding or discharge. She also has what appears to be an abscess to her right upper back. Will IND. There is some small amount of associated cellulitis. We'll discharge home on antibiotics.  ED PROGRESS: Labs unremarkable. Chest x-ray clear. Urine does show hemoglobin and bacteria. Culture pending. Will discharge on Bactrim for her possible UTI as well as cellulitis. We'll discharge with prescriptions for ibuprofen to use it for pain. Discussed return precautions. Have advised her to follow up with her PCP for her multiple chronic symptoms.    EKG Interpretation  Date/Time:  Thursday June 03 2014 10:09:42 EST Ventricular Rate:  76 PR Interval:  128 QRS Duration: 86 QT Interval:  388 QTC Calculation: 436 R Axis:   91 Text Interpretation:  Sinus rhythm Borderline right axis deviation No significant change since last tracing Confirmed by WARD,  DO, KRISTEN ([id number]) on 06/03/2014 10:17:35 AM        INCISION AND DRAINAGE Performed by: Nyra Jabs Consent: Verbal consent obtained. Risks and benefits: risks, benefits and alternatives were discussed Type: abscess  Body area: Right upper back  Anesthesia: local infiltration  Incision was made with a scalpel.  Local anesthetic: lidocaine 1 % with  epinephrine  Anesthetic total: 3 ml  Complexity: complex Blunt dissection to break up loculations  Drainage: purulent  Drainage amount: Minimal   Patient tolerance: Patient tolerated the procedure well with no immediate complications.     [place], DO 06/03/14 [id number]

## 2014-06-03 NOTE — Discharge Instructions (Signed)
Chest Pain (Nonspecific) °It is often hard to give a specific diagnosis for the cause of chest pain. There is always a chance that your pain could be related to something serious, such as a heart attack or a blood clot in the lungs. You need to follow up with your health care provider for further evaluation. °CAUSES  °· Heartburn. °· Pneumonia or bronchitis. °· Anxiety or stress. °· Inflammation around your heart (pericarditis) or lung (pleuritis or pleurisy). °· A blood clot in the lung. °· A collapsed lung (pneumothorax). It can develop suddenly on its own (spontaneous pneumothorax) or from trauma to the chest. °· Shingles infection (herpes zoster virus). °The chest wall is composed of bones, muscles, and cartilage. Any of these can be the source of the pain. °· The bones can be bruised by injury. °· The muscles or cartilage can be strained by coughing or overwork. °· The cartilage can be affected by inflammation and become sore (costochondritis). °DIAGNOSIS  °Lab tests or other studies may be needed to find the cause of your pain. Your health care provider may have you take a test called an ambulatory electrocardiogram (ECG). An ECG records your heartbeat patterns over a 24-hour period. You may also have other tests, such as: °· Transthoracic echocardiogram (TTE). During echocardiography, sound waves are used to evaluate how blood flows through your heart. °· Transesophageal echocardiogram (TEE). °· Cardiac monitoring. This allows your health care provider to monitor your heart rate and rhythm in real time. °· Holter monitor. This is a portable device that records your heartbeat and can help diagnose heart arrhythmias. It allows your health care provider to track your heart activity for several days, if needed. °· Stress tests by exercise or by giving medicine that makes the heart beat faster. °TREATMENT  °· Treatment depends on what may be causing your chest pain. Treatment may include: °· Acid blockers for  heartburn. °· Anti-inflammatory medicine. °· Pain medicine for inflammatory conditions. °· Antibiotics if an infection is present. °· You may be advised to change lifestyle habits. This includes stopping smoking and avoiding alcohol, caffeine, and chocolate. °· You may be advised to keep your head raised (elevated) when sleeping. This reduces the chance of acid going backward from your stomach into your esophagus. °Most of the time, nonspecific chest pain will improve within 2-3 days with rest and mild pain medicine.  °HOME CARE INSTRUCTIONS  °· If antibiotics were prescribed, take them as directed. Finish them even if you start to feel better. °· For the next few days, avoid physical activities that bring on chest pain. Continue physical activities as directed. °· Do not use any tobacco products, including cigarettes, chewing tobacco, or electronic cigarettes. °· Avoid drinking alcohol. °· Only take medicine as directed by your health care provider. °· Follow your health care provider's suggestions for further testing if your chest pain does not go away. °· Keep any follow-up appointments you made. If you do not go to an appointment, you could develop lasting (chronic) problems with pain. If there is any problem keeping an appointment, call to reschedule. °SEEK MEDICAL CARE IF:  °· Your chest pain does not go away, even after treatment. °· You have a rash with blisters on your chest. °· You have a fever. °SEEK IMMEDIATE MEDICAL CARE IF:  °· You have increased chest pain or pain that spreads to your arm, neck, jaw, back, or abdomen. °· You have shortness of breath. °· You have an increasing cough, or you cough   up blood.  You have severe back or abdominal pain.  You feel nauseous or vomit.  You have severe weakness.  You faint.  You have chills. This is an emergency. Do not wait to see if the pain will go away. Get medical help at once. Call your local emergency services (911 in [place] Do not drive  yourself to the hospital. MAKE SURE YOU:   Understand these instructions.  Will watch your condition.  Will get help right away if you are not doing well or get worse. Document Released: 02/28/2005 Document Revised: 05/26/2013 Document Reviewed: 12/25/2007 [facility] Patient Information 2015 [place], [place]. This information is not intended to replace advice given to you by your health care provider. Make sure you discuss any questions you have with your health care provider.  Urinary Tract Infection Urinary tract infections (UTIs) can develop anywhere along your urinary tract. Your urinary tract is your body's drainage system for removing wastes and extra water. Your urinary tract includes two kidneys, two ureters, a bladder, and a urethra. Your kidneys are a pair of bean-shaped organs. Each kidney is about the size of your fist. They are located below your ribs, one on each side of your spine. CAUSES Infections are caused by microbes, which are microscopic organisms, including fungi, viruses, and bacteria. These organisms are so small that they can only be seen through a microscope. Bacteria are the microbes that most commonly cause UTIs. SYMPTOMS  Symptoms of UTIs may vary by age and gender of the patient and by the location of the infection. Symptoms in young women typically include a frequent and intense urge to urinate and a painful, burning feeling in the bladder or urethra during urination. Older women and men are more likely to be tired, shaky, and weak and have muscle aches and abdominal pain. A fever may mean the infection is in your kidneys. Other symptoms of a kidney infection include pain in your back or sides below the ribs, nausea, and vomiting. DIAGNOSIS To diagnose a UTI, your caregiver will ask you about your symptoms. Your caregiver also will ask to provide a urine sample. The urine sample will be tested for bacteria and white blood cells. White blood cells are made by your body  to help fight infection. TREATMENT  Typically, UTIs can be treated with medication. Because most UTIs are caused by a bacterial infection, they usually can be treated with the use of antibiotics. The choice of antibiotic and length of treatment depend on your symptoms and the type of bacteria causing your infection. HOME CARE INSTRUCTIONS  If you were prescribed antibiotics, take them exactly as your caregiver instructs you. Finish the medication even if you feel better after you have only taken some of the medication.  Drink enough water and fluids to keep your urine clear or pale yellow.  Avoid caffeine, tea, and carbonated beverages. They tend to irritate your bladder.  Empty your bladder often. Avoid holding urine for long periods of time.  Empty your bladder before and after sexual intercourse.  After a bowel movement, women should cleanse from front to back. Use each tissue only once. SEEK MEDICAL CARE IF:   You have back pain.  You develop a fever.  Your symptoms do not begin to resolve within 3 days. SEEK IMMEDIATE MEDICAL CARE IF:   You have severe back pain or lower abdominal pain.  You develop chills.  You have nausea or vomiting.  You have continued burning or discomfort with urination. MAKE SURE  YOU:   Understand these instructions.  Will watch your condition.  Will get help right away if you are not doing well or get worse. Document Released: 02/28/2005 Document Revised: 11/20/2011 Document Reviewed: 06/29/2011 [facility] Patient Information 2015 [place], [place]. This information is not intended to replace advice given to you by your health care provider. Make sure you discuss any questions you have with your health care provider.  Abscess Care After An abscess (also called a boil or furuncle) is an infected area that contains a collection of pus. Signs and symptoms of an abscess include pain, tenderness, redness, or hardness, or you may feel a moveable soft  area under your skin. An abscess can occur anywhere in the body. The infection may spread to surrounding tissues causing cellulitis. A cut (incision) by the surgeon was made over your abscess and the pus was drained out. Gauze may have been packed into the space to provide a drain that will allow the cavity to heal from the inside outwards. The boil may be painful for 5 to 7 days. Most people with a boil do not have high fevers. Your abscess, if seen early, may not have localized, and may not have been lanced. If not, another appointment may be required for this if it does not get better on its own or with medications. HOME CARE INSTRUCTIONS   Only take over-the-counter or prescription medicines for pain, discomfort, or fever as directed by your caregiver.  When you bathe, soak and then remove gauze or iodoform packs at least daily or as directed by your caregiver. You may then wash the wound gently with mild soapy water. Repack with gauze or do as your caregiver directs. SEEK IMMEDIATE MEDICAL CARE IF:   You develop increased pain, swelling, redness, drainage, or bleeding in the wound site.  You develop signs of generalized infection including muscle aches, chills, fever, or a general ill feeling.  An oral temperature above 102 F (38.9 C) develops, not controlled by medication. See your caregiver for a recheck if you develop any of the symptoms described above. If medications (antibiotics) were prescribed, take them as directed. Document Released: 12/07/2004 Document Revised: 08/13/2011 Document Reviewed: 08/04/2007 [facility] Patient Information 2015 [place], [place]. This information is not intended to replace advice given to you by your health care provider. Make sure you discuss any questions you have with your health care provider.  Cellulitis Cellulitis is an infection of the skin and the tissue beneath it. The infected area is usually red and tender. Cellulitis occurs most often in the arms  and lower legs.  CAUSES  Cellulitis is caused by bacteria that enter the skin through cracks or cuts in the skin. The most common types of bacteria that cause cellulitis are staphylococci and streptococci. SIGNS AND SYMPTOMS   Redness and warmth.  Swelling.  Tenderness or pain.  Fever. DIAGNOSIS  Your health care provider can usually determine what is wrong based on a physical exam. Blood tests may also be done. TREATMENT  Treatment usually involves taking an antibiotic medicine. HOME CARE INSTRUCTIONS   Take your antibiotic medicine as directed by your health care provider. Finish the antibiotic even if you start to feel better.  Keep the infected arm or leg elevated to reduce swelling.  Apply a warm cloth to the affected area up to 4 times per day to relieve pain.  Take medicines only as directed by your health care provider.  Keep all follow-up visits as directed by your health care  provider. SEEK MEDICAL CARE IF:   You notice red streaks coming from the infected area.  Your red area gets larger or turns dark in color.  Your bone or joint underneath the infected area becomes painful after the skin has healed.  Your infection returns in the same area or another area.  You notice a swollen bump in the infected area.  You develop new symptoms.  You have a fever. SEEK IMMEDIATE MEDICAL CARE IF:   You feel very sleepy.  You develop vomiting or diarrhea.  You have a general ill feeling (malaise) with muscle aches and pains. MAKE SURE YOU:   Understand these instructions.  Will watch your condition.  Will get help right away if you are not doing well or get worse. Document Released: 02/28/2005 Document Revised: 10/05/2013 Document Reviewed: 08/06/2011 [facility] Patient Information 2015 [place], [place]. This information is not intended to replace advice given to you by your health care provider. Make sure you discuss any questions you have with your health care  provider.

## 2014-06-03 NOTE — ED Notes (Signed)
Patient reports that she has been having chest pressure x 6 months. This episode of chest pressure began last night and patient states she had dizziness and palpitations. Patient states she was seen by a cardiologist, but did not follow up. Patient also has an abscess right mid back x 2 days.

## 2014-06-06 LAB — URINE CULTURE: Colony Count: 30000

## 2014-06-07 ENCOUNTER — Encounter (HOSPITAL_BASED_OUTPATIENT_CLINIC_OR_DEPARTMENT_OTHER): Payer: Self-pay

## 2014-06-07 ENCOUNTER — Telehealth (HOSPITAL_BASED_OUTPATIENT_CLINIC_OR_DEPARTMENT_OTHER): Payer: Self-pay | Admitting: Emergency Medicine

## 2014-06-07 ENCOUNTER — Emergency Department (HOSPITAL_BASED_OUTPATIENT_CLINIC_OR_DEPARTMENT_OTHER)
Admission: EM | Admit: 2014-06-07 | Discharge: 2014-06-07 | Disposition: A | Discharge status: Home / Self Care | Payer: Medicaid Other | Attending: Emergency Medicine | Admitting: Emergency Medicine

## 2014-06-07 DIAGNOSIS — Z8719 Personal history of other diseases of the digestive system: Secondary | ICD-10-CM | POA: Insufficient documentation

## 2014-06-07 DIAGNOSIS — Z87440 Personal history of urinary (tract) infections: Secondary | ICD-10-CM | POA: Insufficient documentation

## 2014-06-07 DIAGNOSIS — Z720 Tobacco use: Secondary | ICD-10-CM | POA: Insufficient documentation

## 2014-06-07 DIAGNOSIS — B3731 Acute candidiasis of vulva and vagina: Secondary | ICD-10-CM

## 2014-06-07 DIAGNOSIS — L02212 Cutaneous abscess of back [any part, except buttock]: Secondary | ICD-10-CM | POA: Insufficient documentation

## 2014-06-07 DIAGNOSIS — Z8659 Personal history of other mental and behavioral disorders: Secondary | ICD-10-CM | POA: Insufficient documentation

## 2014-06-07 DIAGNOSIS — Z79899 Other long term (current) drug therapy: Secondary | ICD-10-CM | POA: Insufficient documentation

## 2014-06-07 DIAGNOSIS — Z862 Personal history of diseases of the blood and blood-forming organs and certain disorders involving the immune mechanism: Secondary | ICD-10-CM | POA: Insufficient documentation

## 2014-06-07 DIAGNOSIS — Z8669 Personal history of other diseases of the nervous system and sense organs: Secondary | ICD-10-CM | POA: Insufficient documentation

## 2014-06-07 DIAGNOSIS — B373 Candidiasis of vulva and vagina: Secondary | ICD-10-CM | POA: Insufficient documentation

## 2014-06-07 LAB — WET PREP, GENITAL
Clue Cells Wet Prep HPF POC: NONE SEEN
TRICH WET PREP: NONE SEEN

## 2014-06-07 MED ORDER — FLUCONAZOLE 150 MG PO TABS: 150.0000 mg | ORAL_TABLET | Freq: Every day | ORAL | Status: DC

## 2014-06-07 NOTE — ED Notes (Signed)
Assisting PA with pelvic

## 2014-06-07 NOTE — Discharge Instructions (Signed)
Continue taking your Septra DS (Antibiotic) for your abscess.

## 2014-06-07 NOTE — ED Provider Notes (Signed)
CSN: [id number]     Arrival date & time 06/07/14  1138 History   First MD Initiated Contact with Patient 06/07/14 1151     Chief Complaint  Patient presents with  . Wound Check     (Consider location/radiation/quality/duration/timing/severity/associated sxs/prior Treatment) HPI Comments: Patient presents today requesting a recheck of an abscess of her upper back.  Abscess was incised and drained in the ED on 06/03/14.  She was then placed on Septra DS, which she reports that she is taking.  She reports that the abscess that was incised is improving.  She has noticed purulent drainage from the area.  She denies fever, chills, nausea, or vomiting.  She also reports that when she got home from the ED on 06/03/14 she noticed another small abscess of the lower back.  She states that this abscess popped on its own and drained a small amount of purulent fluid.  No erythema surrounding this abscess.  She denies history of DM or HIV.  She does have a history of Abscesses.  She also states that she has noticed whitish colored vaginal discharge and also vaginal itching and irritation over the past couple of days.  She reports that she frequently gets vaginal yeast infections when on antibiotics.  She states that symptoms feel similar to when she has had a yeast infection in the past.  She denies urinary symptoms, abdominal pain, or pelvic pain.  She is currently on her menstrual cycle.  She had a urine pregnancy test on 06/03/14 in the ED, which was negative.    The history is provided by the patient.    Past Medical History  Diagnosis Date  . Anemia   . Headache(784.0)   . Urinary tract infection   . Abnormal Pap smear   . Human papilloma virus     cells removed x2  . Chlamydia   . Trichomonas   . GERD (gastroesophageal reflux disease)   . Spinal headache   . Anxiety   . Vaginal Pap smear, abnormal    Past Surgical History  Procedure Laterality Date  . Dilation and curettage of uterus    .  Therapeutic abortion    . Pilonidal cyst excision    . Gynecologic cryosurgery     Family History  Problem Relation Age of Onset  . Hypertension Mother   . Fibroids Mother   . Cancer Maternal Grandfather    History  Substance Use Topics  . Smoking status: Current Every Day Smoker -- 0.25 packs/day for 14 years    Types: Cigarettes  . Smokeless tobacco: Never Used  . Alcohol Use: Yes     Comment:  socially   OB History    Gravida Para Term Preterm AB TAB SAB Ectopic Multiple Living   7 3 3  0 4 4 0 0 0 3     Review of Systems  All other systems reviewed and are negative.     Allergies  Review of patient's allergies indicates no known allergies.  Home Medications   Prior to Admission medications   Medication Sig Start Date End Date Taking? Authorizing Provider  ibuprofen (ADVIL,MOTRIN) 800 MG tablet Take 1 tablet (800 mg total) by mouth every 8 (eight) hours as needed for mild pain. 06/03/14   Kristen N Ward, DO  Norgestimate-Ethinyl Estradiol Triphasic (TRINESSA, 28,) 0.18/0.215/0.25 MG-35 MCG tablet Take 1 tablet by mouth daily.    Historical Provider, MD  ondansetron (ZOFRAN) 4 MG tablet Take 1 tablet (4 mg total) by mouth  every 8 (eight) hours as needed for nausea. Patient not taking: Reported on 05/17/2014 09/17/12   Melony Overly, MD  sulfamethoxazole-trimethoprim (BACTRIM DS,SEPTRA DS) 800-160 MG per tablet Take 1 tablet by mouth 2 (two) times daily. 06/03/14 06/10/14  Kristen N Ward, DO   BP 104/64 mmHg  Pulse 98  Temp(Src) 98.4 F (36.9 C) (Oral)  Resp 16  Ht 5' (1.524 m)  Wt 124 lb (56.246 kg)  BMI 24.22 kg/m2  SpO2 100%  LMP 06/01/2014 Physical Exam  Constitutional: She appears well-developed and well-nourished.  HENT:  Head: Normocephalic and atraumatic.  Mouth/Throat: Oropharynx is clear and moist.  Neck: Normal range of motion. Neck supple.  Cardiovascular: Normal rate, regular rhythm and normal heart sounds.   Pulmonary/Chest: Effort normal and  breath sounds normal.  Abdominal: Soft. Bowel sounds are normal. She exhibits no distension and no mass. There is no tenderness. There is no rebound and no guarding.  Genitourinary: There is no rash, tenderness or lesion on the right labia. There is no rash, tenderness or lesion on the left labia. Cervix exhibits no motion tenderness. Right adnexum displays no mass, no tenderness and no fullness. Left adnexum displays no mass, no tenderness and no fullness.  Moderate blood in the vaginal vault consistent with her being on her menstrual cycle.    Musculoskeletal: Normal range of motion.  Neurological: She is alert.  Skin: Skin is warm and dry.  Open abscess of the left upper back actively draining small amount of purulent fluid.  Mild surrounding erythema and induration.    Small approximately 1 cm abscess on the lower back open.  No active drainage. No fluctuance.  No surrounding erythema or induration.    Psychiatric: She has a normal mood and affect.  Nursing note and vitals reviewed.   ED Course  Procedures (including critical care time) Labs Review Labs Reviewed  WET PREP, GENITAL - Abnormal; Notable for the following:    Yeast Wet Prep HPF POC MODERATE (*)    WBC, Wet Prep HPF POC FEW (*)    All other components within normal limits  GC/CHLAMYDIA PROBE AMP    Imaging Review No results found.   EKG Interpretation None      MDM   Final diagnoses:  None   Patient presents today for recheck of an abscess on her upper back.  She reports that the area is improving.  She is afebrile.  Non toxic appearing.  She has been instructed to continue taking her antibiotic.  She also is complaining of whitish vaginal discharge.  Wet prep showing yeast.  No CMT or adnexal tenderness on exam.  Patient given Rx for Diflucan.  Feel that she is stable for discharge.  Return precautions given.      Hyman Bible, PA-C 06/09/14 [place], MD 06/09/14 [phone]

## 2014-06-07 NOTE — ED Notes (Signed)
Accesses have gauze and tape over them.  Pt c/o Yeast infection since taking the antibiotic of the abscess. Abnormal bleeding since Nov.

## 2014-06-07 NOTE — ED Notes (Signed)
Pt reports went for I&D of back abscess to [facility], reports now with another abscess and worsening of wound that was drained.

## 2014-06-08 ENCOUNTER — Other Ambulatory Visit: Payer: Medicaid Other

## 2014-06-09 LAB — GC/CHLAMYDIA PROBE AMP
CT Probe RNA: NEGATIVE
GC Probe RNA: NEGATIVE

## 2014-06-20 ENCOUNTER — Encounter (HOSPITAL_COMMUNITY): Payer: Self-pay | Admitting: Emergency Medicine

## 2014-06-20 ENCOUNTER — Emergency Department (INDEPENDENT_AMBULATORY_CARE_PROVIDER_SITE_OTHER)
Admission: EM | Admit: 2014-06-20 | Discharge: 2014-06-20 | Disposition: A | Discharge status: Home / Self Care | Payer: PRIVATE HEALTH INSURANCE | Source: Home / Self Care | Attending: Family Medicine | Admitting: Family Medicine

## 2014-06-20 DIAGNOSIS — N390 Urinary tract infection, site not specified: Secondary | ICD-10-CM

## 2014-06-20 LAB — POCT URINALYSIS DIP (DEVICE)
Bilirubin Urine: NEGATIVE
Glucose, UA: NEGATIVE mg/dL
Ketones, ur: NEGATIVE mg/dL
Leukocytes, UA: NEGATIVE
Nitrite: NEGATIVE
Protein, ur: NEGATIVE mg/dL
Specific Gravity, Urine: 1.02 (ref 1.005–1.030)
Urobilinogen, UA: 0.2 mg/dL (ref 0.0–1.0)
pH: 7 (ref 5.0–8.0)

## 2014-06-20 LAB — POCT PREGNANCY, URINE: Preg Test, Ur: NEGATIVE

## 2014-06-20 MED ORDER — CEPHALEXIN 500 MG PO CAPS: 500.0000 mg | ORAL_CAPSULE | Freq: Three times a day (TID) | ORAL | Status: DC

## 2014-06-20 NOTE — ED Notes (Signed)
Reports frequent urination.  And abscess on back.  Pt was prescribed antibiotic for both and has completed those meds with no relief.

## 2014-06-20 NOTE — ED Provider Notes (Signed)
Ashley Cole is a 29 y.o. female who presents to Urgent Care today for uti and follow up abscess. Patient has a 3 week history of abscess. This was drained in the emergency department in late December. The same time she does not have a urinary tract infection. She was given IV antibiotics which she finished 2 weeks ago. Since then she's noted continued skin irritation on her back at the area of abscess and recently developed urinary frequency and urgency. She denies any pain fevers or chills. She feels well otherwise. She does not have a primary care provider.   Past Medical History  Diagnosis Date  . Anemia   . Headache(784.0)   . Urinary tract infection   . Abnormal Pap smear   . Human papilloma virus     cells removed x2  . Chlamydia   . Trichomonas   . GERD (gastroesophageal reflux disease)   . Spinal headache   . Anxiety   . Vaginal Pap smear, abnormal    Past Surgical History  Procedure Laterality Date  . Dilation and curettage of uterus    . Therapeutic abortion    . Pilonidal cyst excision    . Gynecologic cryosurgery     History  Substance Use Topics  . Smoking status: Current Every Day Smoker -- 0.25 packs/day for 14 years    Types: Cigarettes  . Smokeless tobacco: Never Used  . Alcohol Use: Yes     Comment:  socially   ROS as above Medications: No current facility-administered medications for this encounter.   Current Outpatient Prescriptions  Medication Sig Dispense Refill  . cephALEXin (KEFLEX) 500 MG capsule Take 1 capsule (500 mg total) by mouth 3 (three) times daily. 21 capsule 0  . fluconazole (DIFLUCAN) 150 MG tablet Take 1 tablet (150 mg total) by mouth daily. 2 tablet 0  . ibuprofen (ADVIL,MOTRIN) 800 MG tablet Take 1 tablet (800 mg total) by mouth every 8 (eight) hours as needed for mild pain. 30 tablet 0  . Norgestimate-Ethinyl Estradiol Triphasic (TRINESSA, 28,) 0.18/0.215/0.25 MG-35 MCG tablet Take 1 tablet by mouth daily.    . ondansetron  (ZOFRAN) 4 MG tablet Take 1 tablet (4 mg total) by mouth every 8 (eight) hours as needed for nausea. (Patient not taking: Reported on 05/17/2014) 20 tablet 0   No Known Allergies   Exam:  BP 134/67 mmHg  Pulse 84  Temp(Src) 98.6 F (37 C) (Oral)  Resp 16  SpO2 100%  LMP 06/01/2014 Gen: Well NAD HEENT: EOMI,  MMM Lungs: Normal work of breathing. CTABL Heart: RRR no MRG Abd: NABS, Soft. Nondistended, Nontender Exts: Brisk capillary refill, warm and well perfused.  Skin: Erythematous circular area about 3 cm in diameter on right thoracic back. No fluctuance induration palpated. Nontender  Results for orders placed or performed during the hospital encounter of 06/20/14 (from the past 24 hour(s))  POCT urinalysis dip (device)     Status: Abnormal   Collection Time: 06/20/14  5:22 PM  Result Value Ref Range   Glucose, UA NEGATIVE NEGATIVE mg/dL   Bilirubin Urine NEGATIVE NEGATIVE   Ketones, ur NEGATIVE NEGATIVE mg/dL   Specific Gravity, Urine 1.020 1.005 - 1.030   Hgb urine dipstick MODERATE (A) NEGATIVE   pH 7.0 5.0 - 8.0   Protein, ur NEGATIVE NEGATIVE mg/dL   Urobilinogen, UA 0.2 0.0 - 1.0 mg/dL   Nitrite NEGATIVE NEGATIVE   Leukocytes, UA NEGATIVE NEGATIVE  Pregnancy, urine POC     Status: None  Collection Time: 06/20/14  5:33 PM  Result Value Ref Range   Preg Test, Ur NEGATIVE NEGATIVE   No results found.  Assessment and Plan: 29 y.o. female with  1) UTI: Probable culture pending treat with Keflex 2) resolving abscess: Watchful waiting. Vaseline and follow-up with PCP.  Discussed warning signs or symptoms. Please see discharge instructions. Patient expresses understanding.     Gregor Hams, MD 06/20/14 [phone]

## 2014-06-20 NOTE — Discharge Instructions (Signed)
Thank you for coming in today.  Urinary Tract Infection Urinary tract infections (UTIs) can develop anywhere along your urinary tract. Your urinary tract is your body's drainage system for removing wastes and extra water. Your urinary tract includes two kidneys, two ureters, a bladder, and a urethra. Your kidneys are a pair of bean-shaped organs. Each kidney is about the size of your fist. They are located below your ribs, one on each side of your spine. CAUSES Infections are caused by microbes, which are microscopic organisms, including fungi, viruses, and bacteria. These organisms are so small that they can only be seen through a microscope. Bacteria are the microbes that most commonly cause UTIs. SYMPTOMS  Symptoms of UTIs may vary by age and gender of the patient and by the location of the infection. Symptoms in young women typically include a frequent and intense urge to urinate and a painful, burning feeling in the bladder or urethra during urination. Older women and men are more likely to be tired, shaky, and weak and have muscle aches and abdominal pain. A fever may mean the infection is in your kidneys. Other symptoms of a kidney infection include pain in your back or sides below the ribs, nausea, and vomiting. DIAGNOSIS To diagnose a UTI, your caregiver will ask you about your symptoms. Your caregiver also will ask to provide a urine sample. The urine sample will be tested for bacteria and white blood cells. White blood cells are made by your body to help fight infection. TREATMENT  Typically, UTIs can be treated with medication. Because most UTIs are caused by a bacterial infection, they usually can be treated with the use of antibiotics. The choice of antibiotic and length of treatment depend on your symptoms and the type of bacteria causing your infection. HOME CARE INSTRUCTIONS  If you were prescribed antibiotics, take them exactly as your caregiver instructs you. Finish the medication  even if you feel better after you have only taken some of the medication.  Drink enough water and fluids to keep your urine clear or pale yellow.  Avoid caffeine, tea, and carbonated beverages. They tend to irritate your bladder.  Empty your bladder often. Avoid holding urine for long periods of time.  Empty your bladder before and after sexual intercourse.  After a bowel movement, women should cleanse from front to back. Use each tissue only once. SEEK MEDICAL CARE IF:   You have back pain.  You develop a fever.  Your symptoms do not begin to resolve within 3 days. SEEK IMMEDIATE MEDICAL CARE IF:   You have severe back pain or lower abdominal pain.  You develop chills.  You have nausea or vomiting.  You have continued burning or discomfort with urination. MAKE SURE YOU:   Understand these instructions.  Will watch your condition.  Will get help right away if you are not doing well or get worse. Document Released: 02/28/2005 Document Revised: 11/20/2011 Document Reviewed: 06/29/2011 [facility] Patient Information 2015 [place], [place]. This information is not intended to replace advice given to you by your health care provider. Make sure you discuss any questions you have with your health care provider.

## 2014-06-21 LAB — URINE CULTURE
COLONY COUNT: NO GROWTH
Culture: NO GROWTH

## 2014-09-03 ENCOUNTER — Ambulatory Visit
Admission: RE | Admit: 2014-09-03 | Discharge: 2014-09-03 | Disposition: A | Discharge status: Home / Self Care | Payer: PRIVATE HEALTH INSURANCE | Source: Ambulatory Visit | Attending: Gastroenterology | Admitting: Gastroenterology

## 2014-09-03 DIAGNOSIS — D1803 Hemangioma of intra-abdominal structures: Secondary | ICD-10-CM

## 2015-11-02 LAB — PROCEDURE REPORT - SCANNED: Pap: NEGATIVE

## 2015-11-03 ENCOUNTER — Encounter (HOSPITAL_COMMUNITY): Payer: Self-pay | Admitting: Emergency Medicine

## 2015-11-03 ENCOUNTER — Emergency Department (HOSPITAL_COMMUNITY)
Admission: EM | Admit: 2015-11-03 | Discharge: 2015-11-03 | Disposition: A | Discharge status: Home / Self Care | Payer: BLUE CROSS/BLUE SHIELD | Attending: Emergency Medicine | Admitting: Emergency Medicine

## 2015-11-03 ENCOUNTER — Emergency Department (HOSPITAL_COMMUNITY): Payer: BLUE CROSS/BLUE SHIELD

## 2015-11-03 DIAGNOSIS — R0789 Other chest pain: Secondary | ICD-10-CM | POA: Insufficient documentation

## 2015-11-03 DIAGNOSIS — G43809 Other migraine, not intractable, without status migrainosus: Secondary | ICD-10-CM | POA: Insufficient documentation

## 2015-11-03 DIAGNOSIS — F17210 Nicotine dependence, cigarettes, uncomplicated: Secondary | ICD-10-CM | POA: Diagnosis not present

## 2015-11-03 DIAGNOSIS — G43909 Migraine, unspecified, not intractable, without status migrainosus: Secondary | ICD-10-CM | POA: Diagnosis present

## 2015-11-03 DIAGNOSIS — Z791 Long term (current) use of non-steroidal anti-inflammatories (NSAID): Secondary | ICD-10-CM | POA: Insufficient documentation

## 2015-11-03 LAB — I-STAT CHEM 8, ED
BUN: 9 mg/dL (ref 6–20)
CALCIUM ION: 1.22 mmol/L (ref 1.12–1.23)
CHLORIDE: 102 mmol/L (ref 101–111)
CREATININE: 0.7 mg/dL (ref 0.44–1.00)
GLUCOSE: 90 mg/dL (ref 65–99)
HCT: 37 % (ref 36.0–46.0)
Hemoglobin: 12.6 g/dL (ref 12.0–15.0)
Potassium: 3.5 mmol/L (ref 3.5–5.1)
Sodium: 141 mmol/L (ref 135–145)
TCO2: 26 mmol/L (ref 0–100)

## 2015-11-03 LAB — I-STAT BETA HCG BLOOD, ED (MC, WL, AP ONLY): I-stat hCG, quantitative: <5 m[IU]/mL (ref <5)

## 2015-11-03 MED ORDER — SODIUM CHLORIDE 0.9 % IV BOLUS (SEPSIS)
1000.0000 mL | Freq: Once | INTRAVENOUS | Status: AC
  Administered 2015-11-03: 1000 mL via INTRAVENOUS

## 2015-11-03 MED ORDER — DIPHENHYDRAMINE HCL 50 MG/ML IJ SOLN
50.0000 mg | Freq: Once | INTRAMUSCULAR | Status: AC
  Administered 2015-11-03: 50 mg via INTRAVENOUS
  Filled 2015-11-03: qty 1

## 2015-11-03 MED ORDER — KETOROLAC TROMETHAMINE 30 MG/ML IJ SOLN
30.0000 mg | Freq: Once | INTRAMUSCULAR | Status: AC
  Administered 2015-11-03: 30 mg via INTRAVENOUS
  Filled 2015-11-03: qty 1

## 2015-11-03 MED ORDER — PROCHLORPERAZINE EDISYLATE 5 MG/ML IJ SOLN
10.0000 mg | Freq: Once | INTRAMUSCULAR | Status: AC
  Administered 2015-11-03: 10 mg via INTRAVENOUS
  Filled 2015-11-03: qty 2

## 2015-11-03 NOTE — Discharge Instructions (Signed)
We do not suspect serious cause of your chest pain or headache. Given your symptoms now resolved, continue to get rest, drink fluids and take ibuprofen as needed.   Return for worsening symptoms, including fever, vomiting and unable to keep down food/fluids, worsening pain or any other symptoms concerning to you.  Migraine Headache A migraine headache is an intense, throbbing pain on one or both sides of your head. A migraine can last for 30 minutes to several hours. CAUSES  The exact cause of a migraine headache is not always known. However, a migraine may be caused when nerves in the brain become irritated and release chemicals that cause inflammation. This causes pain. Certain things may also trigger migraines, such as:  Alcohol.  Smoking.  Stress.  Menstruation.  Aged cheeses.  Foods or drinks that contain nitrates, glutamate, aspartame, or tyramine.  Lack of sleep.  Chocolate.  Caffeine.  Hunger.  Physical exertion.  Fatigue.  Medicines used to treat chest pain (nitroglycerine), birth control pills, estrogen, and some blood pressure medicines. SIGNS AND SYMPTOMS  Pain on one or both sides of your head.  Pulsating or throbbing pain.  Severe pain that prevents daily activities.  Pain that is aggravated by any physical activity.  Nausea, vomiting, or both.  Dizziness.  Pain with exposure to bright lights, loud noises, or activity.  General sensitivity to bright lights, loud noises, or smells. Before you get a migraine, you may get warning signs that a migraine is coming (aura). An aura may include:  Seeing flashing lights.  Seeing bright spots, halos, or zigzag lines.  Having tunnel vision or blurred vision.  Having feelings of numbness or tingling.  Having trouble talking.  Having muscle weakness. DIAGNOSIS  A migraine headache is often diagnosed based on:  Symptoms.  Physical exam.  A CT scan or MRI of your head. These imaging tests cannot  diagnose migraines, but they can help rule out other causes of headaches. TREATMENT Medicines may be given for pain and nausea. Medicines can also be given to help prevent recurrent migraines.  HOME CARE INSTRUCTIONS  Only take over-the-counter or prescription medicines for pain or discomfort as directed by your health care provider. The use of long-term narcotics is not recommended.  Lie down in a dark, quiet room when you have a migraine.  Keep a journal to find out what may trigger your migraine headaches. For example, write down:  What you eat and drink.  How much sleep you get.  Any change to your diet or medicines.  Limit alcohol consumption.  Quit smoking if you smoke.  Get 7-9 hours of sleep, or as recommended by your health care provider.  Limit stress.  Keep lights dim if bright lights bother you and make your migraines worse. SEEK IMMEDIATE MEDICAL CARE IF:   Your migraine becomes severe.  You have a fever.  You have a stiff neck.  You have vision loss.  You have muscular weakness or loss of muscle control.  You start losing your balance or have trouble walking.  You feel faint or pass out.  You have severe symptoms that are different from your first symptoms. MAKE SURE YOU:   Understand these instructions.  Will watch your condition.  Will get help right away if you are not doing well or get worse.   This information is not intended to replace advice given to you by your health care provider. Make sure you discuss any questions you have with your health care provider.  Document Released: 05/21/2005 Document Revised: 06/11/2014 Document Reviewed: 01/26/2013 Elsevier Interactive Patient Education 2016 [employer]  Nonspecific Chest Pain It is often hard to find the cause of chest pain. There is always a chance that your pain could be related to something serious, such as a heart attack or a blood clot in your lungs. Chest pain can also be caused  by conditions that are not life-threatening. If you have chest pain, it is very important to follow up with your doctor.  HOME CARE  If you were prescribed an antibiotic medicine, finish it all even if you start to feel better.  Avoid any activities that cause chest pain.  Do not use any tobacco products, including cigarettes, chewing tobacco, or electronic cigarettes. If you need help quitting, ask your doctor.  Do not drink alcohol.  Take medicines only as told by your doctor.  Keep all follow-up visits as told by your doctor. This is important. This includes any further testing if your chest pain does not go away.  Your doctor may tell you to keep your head raised (elevated) while you sleep.  Make lifestyle changes as told by your doctor. These may include:  Getting regular exercise. Ask your doctor to suggest some activities that are safe for you.  Eating a heart-healthy diet. Your doctor or a diet specialist (dietitian) can help you to learn healthy eating options.  Maintaining a healthy weight.  Managing diabetes, if necessary.  Reducing stress. GET HELP IF:  Your chest pain does not go away, even after treatment.  You have a rash with blisters on your chest.  You have a fever. GET HELP RIGHT AWAY IF:  Your chest pain is worse.  You have an increasing cough, or you cough up blood.  You have severe belly (abdominal) pain.  You feel extremely weak.  You pass out (faint).  You have chills.  You have sudden, unexplained chest discomfort.  You have sudden, unexplained discomfort in your arms, back, neck, or jaw.  You have shortness of breath at any time.  You suddenly start to sweat, or your skin gets clammy.  You feel nauseous.  You vomit.  You suddenly feel light-headed or dizzy.  Your heart begins to beat quickly, or it feels like it is skipping beats. These symptoms may be an emergency. Do not wait to see if the symptoms will go away. Get medical  help right away. Call your local emergency services (911 in the [place] Do not drive yourself to the hospital.   This information is not intended to replace advice given to you by your health care provider. Make sure you discuss any questions you have with your health care provider.   Document Released: 11/07/2007 Document Revised: 06/11/2014 Document Reviewed: 12/25/2013 Elsevier Interactive Patient Education [employer].

## 2015-11-03 NOTE — ED Provider Notes (Signed)
CSN: [id number]     Arrival date & time 11/03/15  1457 History   First MD Initiated Contact with Patient 11/03/15 1527     Chief Complaint  Patient presents with  . Chest Pain  . Migraine  . Dizziness  . Nausea     (Consider location/radiation/quality/duration/timing/severity/associated sxs/prior Treatment) HPI 30 year old female who presents with chest pain and headache. States that she has history of migraine headaches and spinal headaches. Headache started at 11AM while at work. Initially reports pressure over front of head and base of head and upper neck, that became extremely severe over course of 2-3 days. Took aleve, but pain not improved. Reports one episode of vomiting with sensitivity to light and sound. Associated with dizziness, seeing spots, feeling hot and cold. Then developed sharp shocky chest pain to the center of chest that lasted for brief seconds. Associated palpations. No shortness of breath or pleuritic chest pain. No leg swelling or leg pain. No recent illnesses including URI symptoms, fever, chills, diarrhea.    Past Medical History  Diagnosis Date  . Anemia   . Headache(784.0)   . Urinary tract infection   . Abnormal Pap smear   . Human papilloma virus     cells removed x2  . Chlamydia   . Trichomonas   . GERD (gastroesophageal reflux disease)   . Spinal headache   . Anxiety   . Vaginal Pap smear, abnormal    Past Surgical History  Procedure Laterality Date  . Dilation and curettage of uterus    . Therapeutic abortion    . Pilonidal cyst excision    . Gynecologic cryosurgery     Family History  Problem Relation Age of Onset  . Hypertension Mother   . Fibroids Mother   . Cancer Maternal Grandfather    Social History  Substance Use Topics  . Smoking status: Current Every Day Smoker -- 0.25 packs/day for 14 years    Types: Cigarettes  . Smokeless tobacco: Never Used  . Alcohol Use: Yes     Comment:  socially   OB History    Gravida Para Term  Preterm AB TAB SAB Ectopic Multiple Living   7 3 3  0 4 4 0 0 0 3     Review of Systems 10/14 systems reviewed and are negative other than those stated in the HPI    Allergies  Review of patient's allergies indicates no known allergies.  Home Medications   Prior to Admission medications   Medication Sig Start Date End Date Taking? Authorizing Provider  ibuprofen (ADVIL,MOTRIN) 200 MG tablet Take 400 mg by mouth every 6 (six) hours as needed for fever, headache, mild pain, moderate pain or cramping.   Yes Historical Provider, MD   LMP 10/24/2015 Physical Exam Physical Exam  Nursing note and vitals reviewed. Constitutional: Well developed, well nourished, non-toxic, and in no acute distress Head: Normocephalic and atraumatic.  Mouth/Throat: Oropharynx is clear and moist.  Neck: Normal range of motion. Neck supple.  Cardiovascular: Normal rate and regular rhythm.   Pulmonary/Chest: Effort normal and breath sounds normal.  Abdominal: Soft. There is no tenderness. There is no rebound and no guarding.  Musculoskeletal: Normal range of motion.  Skin: Skin is warm and dry.  Psychiatric: Cooperative Neurological:  Alert, oriented to person, place, time, and situation. Memory grossly in tact. Fluent speech. No dysarthria or aphasia.  Cranial nerves: VF are full.. Pupils are symmetric, and reactive to light. EOMI without nystagmus. No gaze deviation. Facial  muscles symmetric with activation. Sensation to light touch over face in tact bilaterally. Hearing grossly in tact. Palate elevates symmetrically. Head turn and shoulder shrug are intact. Tongue midline.  Reflexes defered.  Muscle bulk and tone normal. No pronator drift. Moves all extremities symmetrically. Sensation to light touch is in tact throughout in bilateral upper and lower extremities. Coordination reveals no dysmetria with finger to nose. Gait is narrow-based and steady. Non-ataxic.  ED Course  Procedures (including  critical care time) Labs Review Labs Reviewed  I-STAT CHEM 8, ED  I-STAT BETA HCG BLOOD, ED ([facility], [facility], [facility] ONLY)    Imaging Review Dg Chest 2 View  11/03/2015  CLINICAL DATA:  Patient states left sided chest pain since 11 am this morning. Headache and fast heart beat since today. Current smoker, .25 packs a day x 14 years. EXAM: CHEST  2 VIEW COMPARISON:  06/03/2014 FINDINGS: Numerous leads and wires project over the chest. Apical lordotic positioning on the frontal radiograph. Midline trachea. Normal heart size and mediastinal contours.Sharp costophrenic angles. No pneumothorax. Clear lungs. IMPRESSION: No active cardiopulmonary disease. Electronically Signed   By: Abigail Miyamoto M.D.   On: 11/03/2015 16:04   Ct Head Wo Contrast  11/03/2015  CLINICAL DATA:  Patient with headache and chest pain. EXAM: CT HEAD WITHOUT CONTRAST TECHNIQUE: Contiguous axial images were obtained from the base of the skull through the vertex without intravenous contrast. COMPARISON:  None. FINDINGS: Ventricles and sulci are appropriate for patient's age. No evidence for acute cortically based infarct, intracranial hemorrhage, mass lesion or mass-effect. Orbits are unremarkable. Paranasal sinuses unremarkable. Mastoid air cells unremarkable. Calvarium is intact. IMPRESSION: No acute intracranial process. Electronically Signed   By: Lovey Newcomer M.D.   On: 11/03/2015 16:10   I have personally reviewed and evaluated these images and lab results as part of my medical decision-making.   EKG Interpretation   Date/Time:  Thursday November 03 2015 15:07:28 EDT Ventricular Rate:  102 PR Interval:  132 QRS Duration: 88 QT Interval:  353 QTC Calculation: 460 R Axis:   77 Text Interpretation:  Sinus tachycardia Baseline wander in lead(s) V6  other than tachycardia, no acute changes since last EKG  Confirmed by Elzia Hott  MD, Hinton Dyer [id number]) on 11/03/2015 3:54:47 PM      MDM   Final diagnoses:  Other migraine without status migrainosus,  not intractable  Atypical chest pain    30 year old female with history of spinal headaches who presents with headache and chest pain. Is tearful on presentation, but is nontoxic. She is neurologically intact. Chest pain full resolved and cardiopulmonary exam unremarkable. Vital signs stable. Given suddenly worsening headache, CT head performed for [facility]. This is negative. Performed within 6 hours of onset of symptoms, and low suspicion for aneurysmal bleeding. Given migraine cocktail including IV fluids, Toradol, Compazine and Benadryl. Reevaluation headache fully resolved. In regards to her chest pain, seems atypical and not suspicious for serious cardiac or intrathoracic etiologies. Chest x-ray is clear and EKG is nonischemic and without stigmata of arrhythmia. She reports being at her baseline and discuss continued supportive care instructions for home. Strict return and follow-up instructions are reviewed. She expressed understanding of all discharge instructions, and felt comfortable to plan of care.    Forde Dandy, MD 11/03/15 [phone]

## 2015-11-03 NOTE — ED Notes (Signed)
Patient here with complaints of headache to back of head radiating down into neck that started this morning associated with dizziness. Centralized chest pain that started at 11 am, sharp shooting. Nausea no relief.

## 2016-01-02 ENCOUNTER — Encounter (HOSPITAL_COMMUNITY): Payer: Self-pay | Admitting: Emergency Medicine

## 2016-01-02 ENCOUNTER — Emergency Department (HOSPITAL_COMMUNITY)
Admission: EM | Admit: 2016-01-02 | Discharge: 2016-01-03 | Disposition: A | Discharge status: Home / Self Care | Payer: BLUE CROSS/BLUE SHIELD | Attending: Emergency Medicine | Admitting: Emergency Medicine

## 2016-01-02 DIAGNOSIS — N390 Urinary tract infection, site not specified: Secondary | ICD-10-CM

## 2016-01-02 DIAGNOSIS — R109 Unspecified abdominal pain: Secondary | ICD-10-CM

## 2016-01-02 DIAGNOSIS — F17210 Nicotine dependence, cigarettes, uncomplicated: Secondary | ICD-10-CM | POA: Diagnosis not present

## 2016-01-02 DIAGNOSIS — Z791 Long term (current) use of non-steroidal anti-inflammatories (NSAID): Secondary | ICD-10-CM | POA: Insufficient documentation

## 2016-01-02 DIAGNOSIS — R300 Dysuria: Secondary | ICD-10-CM | POA: Diagnosis present

## 2016-01-02 LAB — URINE MICROSCOPIC-ADD ON

## 2016-01-02 LAB — URINALYSIS, ROUTINE W REFLEX MICROSCOPIC
Bilirubin Urine: NEGATIVE
GLUCOSE, UA: NEGATIVE mg/dL
Ketones, ur: NEGATIVE mg/dL
Nitrite: NEGATIVE
PH: 6.5 (ref 5.0–8.0)
Protein, ur: 100 mg/dL — AB
SPECIFIC GRAVITY, URINE: 1.018 (ref 1.005–1.030)

## 2016-01-02 LAB — CBC
HEMATOCRIT: 33.1 % — AB (ref 36.0–46.0)
Hemoglobin: 11.1 g/dL — ABNORMAL LOW (ref 12.0–15.0)
MCH: 29.1 pg (ref 26.0–34.0)
MCHC: 33.5 g/dL (ref 30.0–36.0)
MCV: 86.9 fL (ref 78.0–100.0)
Platelets: 307 10*3/uL (ref 150–400)
RBC: 3.81 MIL/uL — AB (ref 3.87–5.11)
RDW: 13.1 % (ref 11.5–15.5)
WBC: 9.1 10*3/uL (ref 4.0–10.5)

## 2016-01-02 LAB — COMPREHENSIVE METABOLIC PANEL
ALBUMIN: 4.5 g/dL (ref 3.5–5.0)
ALT: 20 U/L (ref 14–54)
AST: 21 U/L (ref 15–41)
Alkaline Phosphatase: 56 U/L (ref 38–126)
Anion gap: 7 (ref 5–15)
BUN: 10 mg/dL (ref 6–20)
CHLORIDE: 104 mmol/L (ref 101–111)
CO2: 27 mmol/L (ref 22–32)
Calcium: 9.4 mg/dL (ref 8.9–10.3)
Creatinine, Ser: 0.51 mg/dL (ref 0.44–1.00)
GFR calc Af Amer: >60 mL/min (ref >60)
Glucose, Bld: 95 mg/dL (ref 65–99)
POTASSIUM: 3.2 mmol/L — AB (ref 3.5–5.1)
Sodium: 138 mmol/L (ref 135–145)
Total Bilirubin: 0.6 mg/dL (ref 0.3–1.2)
Total Protein: 7.5 g/dL (ref 6.5–8.1)

## 2016-01-02 LAB — WET PREP, GENITAL
Sperm: NONE SEEN
Trich, Wet Prep: NONE SEEN
Yeast Wet Prep HPF POC: NONE SEEN

## 2016-01-02 LAB — LIPASE, BLOOD: LIPASE: 15 U/L (ref 11–51)

## 2016-01-02 LAB — I-STAT BETA HCG BLOOD, ED (MC, WL, AP ONLY): I-stat hCG, quantitative: <5 m[IU]/mL (ref <5)

## 2016-01-02 MED ORDER — DEXTROSE 5 % IV SOLN
1.0000 g | Freq: Once | INTRAVENOUS | Status: AC
Start: 2016-01-02 — End: 2016-01-03
  Administered 2016-01-03: 1 g via INTRAVENOUS
  Filled 2016-01-02: qty 10

## 2016-01-02 NOTE — ED Triage Notes (Addendum)
Pt reports dysuria and strong smelling urine for the past few days. Today started having R flank pain. Pt adds she has been having abd swelling for the past several weeks as well.

## 2016-01-02 NOTE — ED Provider Notes (Signed)
Emergency Department Provider Note   I have reviewed the triage vital signs and the nursing notes.   HISTORY  Chief Complaint Dysuria and Flank Pain   HPI Ashley Cole is a 30 y.o. female with PMH of anemia, anxiety, GERD presents to the emergency department for evaluation of urinary hesitancy and right sided abdominal and back discomfort. Patient states she had associated fever today but treated with Tylenol. She notes long history of constipation symptoms reports this feels very different. The right-sided abdominal pain has been worsening over the past 2 days. She reports an associated thick vaginal discharge without bleeding. She denies concern for sexual transmitted infection. No chest pain or difficulty breathing. No leg swelling.    Past Medical History:  Diagnosis Date  . Abnormal Pap smear   . Anemia   . Anxiety   . Chlamydia   . GERD (gastroesophageal reflux disease)   . Headache(784.0)   . Human papilloma virus    cells removed x2  . Spinal headache   . Trichomonas   . Urinary tract infection   . Vaginal Pap smear, abnormal     Patient Active Problem List   Diagnosis Date Noted  . GASTROESOPHAGEAL REFLUX DISEASE 07/09/2007  . PILONIDAL CYST 07/09/2007  . ABDOMINAL PAIN 07/09/2007  . EPIGASTRIC PAIN 07/09/2007    Past Surgical History:  Procedure Laterality Date  . DILATION AND CURETTAGE OF UTERUS    . GYNECOLOGIC CRYOSURGERY    . PILONIDAL CYST EXCISION    . THERAPEUTIC ABORTION      Current Outpatient Rx  . Order #: [id number] Class: Historical Med  . Order #: [id number] Class: Print  . Order #: [id number] Class: Print    Allergies Review of patient's allergies indicates no known allergies.  Family History  Problem Relation Age of Onset  . Hypertension Mother   . Fibroids Mother   . Cancer Maternal Grandfather     Social History Social History  Substance Use Topics  . Smoking status: Current Every Day Smoker    Packs/day: 0.25    Years:  14.00    Types: Cigarettes  . Smokeless tobacco: Never Used  . Alcohol use Yes     Comment:  socially    Review of Systems  Constitutional: No fever/chills Eyes: No visual changes. ENT: No sore throat. Cardiovascular: Denies chest pain. Respiratory: Denies shortness of breath. Gastrointestinal: No abdominal pain.  No nausea, no vomiting.  No diarrhea.  No constipation. Genitourinary: Negative for dysuria. Positive hesitancy and urgency. Positive vaginal discharge.  Musculoskeletal: Negative for back pain. Skin: Negative for rash. Neurological: Negative for headaches, focal weakness or numbness.  10-point ROS otherwise negative.  ____________________________________________   PHYSICAL EXAM:  VITAL SIGNS: ED Triage Vitals  Enc Vitals Group     BP 01/02/16 1911 147/79     Pulse Rate 01/02/16 1911 94     Resp 01/02/16 1911 18     Temp 01/02/16 1911 99.6 F (37.6 C)     Temp src --      SpO2 01/02/16 1911 100 %     Pain Score 01/02/16 1922 8    Constitutional: Alert and oriented. Well appearing and in no acute distress. Eyes: Conjunctivae are normal. PERRL. EOMI. Head: Atraumatic. Nose: No congestion/rhinnorhea. Mouth/Throat: Mucous membranes are moist.  Oropharynx non-erythematous. Neck: No stridor.   Cardiovascular: Normal rate, regular rhythm. Good peripheral circulation. Grossly normal heart sounds.   Respiratory: Normal respiratory effort.  No retractions. Lungs CTAB. Gastrointestinal: Soft and nontender. No distention.  Positive right CVA tenderness.  Genitourinary: Mild white vaginal discharge. No blood. No CMT or adnexal tenderness.  Musculoskeletal: No lower extremity tenderness nor edema. No gross deformities of extremities. Neurologic:  Normal speech and language. No gross focal neurologic deficits are appreciated.  Skin:  Skin is warm, dry and intact. No rash noted. Psychiatric: Mood and affect are normal. Speech and behavior are  normal.  ____________________________________________   LABS (all labs ordered are listed, but only abnormal results are displayed)  Labs Reviewed  WET PREP, GENITAL - Abnormal; Notable for the following:       Result Value   Clue Cells Wet Prep HPF POC PRESENT (*)    WBC, Wet Prep HPF POC MODERATE (*)    All other components within normal limits  COMPREHENSIVE METABOLIC PANEL - Abnormal; Notable for the following:    Potassium 3.2 (*)    All other components within normal limits  CBC - Abnormal; Notable for the following:    RBC 3.81 (*)    Hemoglobin 11.1 (*)    HCT 33.1 (*)    All other components within normal limits  URINALYSIS, ROUTINE W REFLEX MICROSCOPIC (NOT AT [facility]) - Abnormal; Notable for the following:    APPearance CLOUDY (*)    Hgb urine dipstick LARGE (*)    Protein, ur 100 (*)    Leukocytes, UA MODERATE (*)    All other components within normal limits  URINE MICROSCOPIC-ADD ON - Abnormal; Notable for the following:    Squamous Epithelial / LPF 0-5 (*)    Bacteria, UA FEW (*)    All other components within normal limits  LIPASE, BLOOD  RPR  HIV ANTIBODY (ROUTINE TESTING)  I-STAT BETA HCG BLOOD, ED ([facility], [facility], [facility] ONLY)  GC/CHLAMYDIA PROBE AMP ([facility]) NOT AT [facility]   ____________________________________________  RADIOLOGY  None ____________________________________________   PROCEDURES  Procedure(s) performed:   Procedures  None ____________________________________________   INITIAL IMPRESSION / ASSESSMENT AND PLAN / ED COURSE  Pertinent labs & imaging results that were available during my care of the patient were reviewed by me and considered in my medical decision making (see chart for details).  Patient presents to the emergency department for evaluation of right-sided abdominal/back pain with urinary hesitancy. Patient has no leukocytosis but moderate leuk esterase on urinalysis with few bacteria. Suspect urinary tract infection with  possible developing Pilo. The patient does have some right-sided CVA tenderness to percussion no fever or other abnormal vital signs here in the emergency department. We'll perform a pelvic exam with wet prep and cultures given her vaginal discharge.   Pelvic exam complete. No Trichomonas. Positive Clue cells with discharge will treat BV with Flagyl. Plan to treat presumed mild pyelonephritis with Ceftriaxone here and 2 weeks of bactrim. Discussed with the patient who is in agreement with plan.   At this time, I do not feel there is any life-threatening condition present. I have reviewed and discussed all results (EKG, imaging, lab, urine as appropriate), exam findings with patient. I have reviewed nursing notes and appropriate previous records.  I feel the patient is safe to be discharged home without further emergent workup. Discussed usual and customary return precautions. Patient and family (if present) verbalize understanding and are comfortable with this plan.  Patient will follow-up with their primary care provider. If they do not have a primary care provider, information for follow-up has been provided to them. All questions have been answered.  ____________________________________________  FINAL CLINICAL IMPRESSION(S) / ED DIAGNOSES  Final diagnoses:  UTI (lower urinary tract infection)  Flank pain     MEDICATIONS GIVEN DURING THIS VISIT:  Medications  cefTRIAXone (ROCEPHIN) 1 g in dextrose 5 % 50 mL IVPB (0 g Intravenous Stopped 01/03/16 0146)  ibuprofen (ADVIL,MOTRIN) tablet 800 mg (800 mg Oral Given 01/03/16 0139)     NEW OUTPATIENT MEDICATIONS STARTED DURING THIS VISIT:  Discharge Medication List as of 01/03/2016  1:00 AM    START taking these medications   Details  metroNIDAZOLE (FLAGYL) 500 MG tablet Take 1 tablet (500 mg total) by mouth 2 (two) times daily., Starting Tue 01/03/2016, Until Tue 01/10/2016, Print    sulfamethoxazole-trimethoprim (BACTRIM DS,SEPTRA DS) 800-160 MG  tablet Take 1 tablet by mouth 2 (two) times daily., Starting Tue 01/03/2016, Until Tue 01/17/2016, Print          Note:  This document was prepared using Dragon voice recognition software and may include unintentional dictation errors.  Nanda Quinton, MD Emergency Medicine   Margette Fast, MD 01/03/16 [phone]

## 2016-01-03 LAB — GC/CHLAMYDIA PROBE AMP (~~LOC~~) NOT AT ARMC
Chlamydia: NEGATIVE
NEISSERIA GONORRHEA: NEGATIVE

## 2016-01-03 LAB — HIV ANTIBODY (ROUTINE TESTING W REFLEX): HIV SCREEN 4TH GENERATION: NONREACTIVE

## 2016-01-03 LAB — RPR: RPR: NONREACTIVE

## 2016-01-03 MED ORDER — SULFAMETHOXAZOLE-TRIMETHOPRIM 800-160 MG PO TABS: 1.0000 | ORAL_TABLET | Freq: Two times a day (BID) | ORAL | 0 refills | Status: AC

## 2016-01-03 MED ORDER — IBUPROFEN 800 MG PO TABS
800.0000 mg | ORAL_TABLET | Freq: Once | ORAL | Status: AC
  Administered 2016-01-03: 800 mg via ORAL
  Filled 2016-01-03: qty 1

## 2016-01-03 MED ORDER — METRONIDAZOLE 500 MG PO TABS: 500.0000 mg | ORAL_TABLET | Freq: Two times a day (BID) | ORAL | 0 refills | Status: AC

## 2016-01-03 NOTE — Discharge Instructions (Signed)
Your workup today suggests that you have a urinary tract infection (UTI) which has spread to your kidneys.  Please take your antibiotic as prescribed and over-the-counter pain medication (Tylenol or Motrin) as needed, but no more than recommended on the label instructions.  Drink PLENTY of fluids.  Call your regular doctor to schedule the next available appointment to follow up on today's ED visit, or return immediately to the ED if your pain worsens, you have decreased urine production, develop fever, persistent vomiting, or other symptoms that concern you.  

## 2016-02-26 ENCOUNTER — Encounter: Payer: Self-pay | Admitting: *Deleted

## 2016-03-15 ENCOUNTER — Ambulatory Visit: Payer: BLUE CROSS/BLUE SHIELD

## 2016-05-03 ENCOUNTER — Encounter: Payer: Self-pay | Admitting: Nurse Practitioner

## 2016-05-03 ENCOUNTER — Other Ambulatory Visit (INDEPENDENT_AMBULATORY_CARE_PROVIDER_SITE_OTHER): Payer: BLUE CROSS/BLUE SHIELD

## 2016-05-03 ENCOUNTER — Ambulatory Visit (INDEPENDENT_AMBULATORY_CARE_PROVIDER_SITE_OTHER): Payer: BLUE CROSS/BLUE SHIELD | Admitting: Nurse Practitioner

## 2016-05-03 VITALS — BP 118/78 | HR 82 | Temp 98.2°F | Ht 60.0 in | Wt 140.0 lb

## 2016-05-03 DIAGNOSIS — D649 Anemia, unspecified: Secondary | ICD-10-CM | POA: Insufficient documentation

## 2016-05-03 DIAGNOSIS — K5909 Other constipation: Secondary | ICD-10-CM

## 2016-05-03 DIAGNOSIS — R1013 Epigastric pain: Secondary | ICD-10-CM | POA: Diagnosis not present

## 2016-05-03 DIAGNOSIS — Z0000 Encounter for general adult medical examination without abnormal findings: Secondary | ICD-10-CM | POA: Diagnosis not present

## 2016-05-03 DIAGNOSIS — Z8041 Family history of malignant neoplasm of ovary: Secondary | ICD-10-CM | POA: Insufficient documentation

## 2016-05-03 DIAGNOSIS — K219 Gastro-esophageal reflux disease without esophagitis: Secondary | ICD-10-CM | POA: Diagnosis not present

## 2016-05-03 DIAGNOSIS — Z8049 Family history of malignant neoplasm of other genital organs: Secondary | ICD-10-CM | POA: Insufficient documentation

## 2016-05-03 LAB — TSH: TSH: 1.15 u[IU]/mL (ref 0.35–4.50)

## 2016-05-03 LAB — CBC WITH DIFFERENTIAL/PLATELET
BASOS PCT: 0.5 % (ref 0.0–3.0)
Basophils Absolute: 0 10*3/uL (ref 0.0–0.1)
EOS PCT: 2.1 % (ref 0.0–5.0)
Eosinophils Absolute: 0.1 10*3/uL (ref 0.0–0.7)
HCT: 33.1 % — ABNORMAL LOW (ref 36.0–46.0)
HEMOGLOBIN: 11.2 g/dL — AB (ref 12.0–15.0)
LYMPHS ABS: 2.3 10*3/uL (ref 0.7–4.0)
Lymphocytes Relative: 34 % (ref 12.0–46.0)
MCHC: 33.8 g/dL (ref 30.0–36.0)
MCV: 85.9 fl (ref 78.0–100.0)
MONO ABS: 0.6 10*3/uL (ref 0.1–1.0)
MONOS PCT: 8.5 % (ref 3.0–12.0)
Neutro Abs: 3.8 10*3/uL (ref 1.4–7.7)
Neutrophils Relative %: 54.9 % (ref 43.0–77.0)
Platelets: 285 10*3/uL (ref 150.0–400.0)
RBC: 3.85 Mil/uL — AB (ref 3.87–5.11)
RDW: 13.5 % (ref 11.5–15.5)
WBC: 6.9 10*3/uL (ref 4.0–10.5)

## 2016-05-03 LAB — LIPID PANEL
Cholesterol: 143 mg/dL (ref 0–200)
HDL: 37.9 mg/dL — ABNORMAL LOW (ref >39.00)
LDL CALC: 89 mg/dL (ref 0–99)
NONHDL: 105.56
Total CHOL/HDL Ratio: 4
Triglycerides: 83 mg/dL (ref 0.0–149.0)
VLDL: 16.6 mg/dL (ref 0.0–40.0)

## 2016-05-03 MED ORDER — POLYETHYLENE GLYCOL 3350 17 GM/SCOOP PO POWD: 17.0000 g | Freq: Every day | ORAL | 1 refills | Status: DC | PRN

## 2016-05-03 MED ORDER — OMEPRAZOLE 20 MG PO CPDR: 20.0000 mg | DELAYED_RELEASE_CAPSULE | Freq: Every day | ORAL | 3 refills | Status: DC

## 2016-05-03 MED ORDER — POLYETHYLENE GLYCOL 3350 17 GM/SCOOP PO POWD: 1.0000 | Freq: Every day | ORAL | 0 refills | Status: DC | PRN

## 2016-05-03 MED ORDER — LINACLOTIDE 72 MCG PO CAPS: 72.0000 ug | ORAL_CAPSULE | Freq: Every day | ORAL | 0 refills | Status: DC

## 2016-05-03 NOTE — Progress Notes (Signed)
Subjective:    Patient ID: Ashley Cole, female    DOB: 08-03-1985, 30 y.o.   MRN: [id number]  Patient presents today for complete physical or establish care (new patient)  Constipation  This is a chronic problem. The current episode started more than 1 year ago (x 70yrs). The problem has been waxing and waning since onset. Her stool frequency is 2 to 3 times per week. The stool is described as firm and pellet like. The patient is not on a high fiber diet. She does not exercise regularly. There has not been adequate water intake. Associated symptoms include abdominal pain and bloating. Pertinent negatives include no anorexia, diarrhea, difficulty urinating, fecal incontinence, fever, flatus, hematochezia, hemorrhoids, melena, nausea, rectal pain, vomiting or weight loss. She has tried laxatives for the symptoms. The treatment provided no relief. Her past medical history is significant for irritable bowel syndrome. There is no history of psychiatric history.    Immunizations: (TDAP, Hep C screen, Pneumovax, Influenza, zoster)  Health Maintenance  Topic Date Due  . Tetanus Vaccine  09/30/2004  . Flu Shot  09/01/2016*  . Pap Smear  11/02/2018  . HIV Screening  Completed  *Topic was postponed. The date shown is not the original due date.   Diet:regular Weight: concerned about weight gain Wt Readings from Last 3 Encounters:  05/03/16 140 lb (63.5 kg)  06/07/14 124 lb (56.2 kg)  06/03/14 127 lb (57.6 kg)   Exercise:none Fall Risk: Fall Risk  05/03/2016  Falls in the past year? No   Home Safety:home with husband and 2children (65yrs and 41yrs) Depression/Suicide: Depression screen [facility] 2/9 05/03/2016  Decreased Interest 0  Down, Depressed, Hopeless 0  PHQ - 2 Score 0   No flowsheet data found. Pap Smear (every 26yrs for >21-29 without HPV, every 75yrs for >30-72yrs with HPV):up to date  Vision:needed Dental:needed Advanced Directive: Advanced Directives 01/02/2016  Does Patient  Have a Medical Advance Directive? No  Would patient like information on creating a medical advance directive? No - patient declined information  Pre-existing out of facility DNR order (yellow form or pink MOST form) -   Sexual History (birth control, marital status, STD): sexually active, married, states husband is infertile due to childhood testicular injury  Medications and allergies reviewed with patient and updated if appropriate.  Patient Active Problem List   Diagnosis Date Noted  . Family history of cervical cancer 05/03/2016  . Family history of ovarian cancer 05/03/2016  . Anemia 05/03/2016  . GASTROESOPHAGEAL REFLUX DISEASE 07/09/2007  . ABDOMINAL PAIN 07/09/2007  . EPIGASTRIC PAIN 07/09/2007    Current Outpatient Prescriptions on File Prior to Visit  Medication Sig Dispense Refill  . ibuprofen (ADVIL,MOTRIN) 200 MG tablet Take 400 mg by mouth every 6 (six) hours as needed for fever, headache, mild pain, moderate pain or cramping.     No current facility-administered medications on file prior to visit.     Past Medical History:  Diagnosis Date  . Abnormal Pap smear   . Anemia   . Anxiety   . Chlamydia   . GERD (gastroesophageal reflux disease)   . Headache(784.0)   . Human papilloma virus    cells removed x2  . Spinal headache   . Trichomonas   . Urinary tract infection   . Vaginal Pap smear, abnormal     Past Surgical History:  Procedure Laterality Date  . DILATION AND CURETTAGE OF UTERUS    . GYNECOLOGIC CRYOSURGERY    . PILONIDAL CYST  EXCISION    . THERAPEUTIC ABORTION      Social History   Social History  . Marital status: Single    Spouse name: N/A  . Number of children: N/A  . Years of education: N/A   Social History Main Topics  . Smoking status: Current Every Day Smoker    Packs/day: 0.25    Years: 14.00    Types: Cigarettes  . Smokeless tobacco: Never Used  . Alcohol use Yes     Comment:  socially  . Drug use: No  . Sexual activity:  Yes    Birth control/ protection: Pill     Comment: Last intercourse 3 days ago   Other Topics Concern  . None   Social History Narrative  . None    Family History  Problem Relation Age of Onset  . Hypertension Mother   . Fibroids Mother   . Cancer Mother     ovarian  . Cancer Maternal Grandfather     cervical  . Thyroid disease Brother         Review of Systems  Constitutional: Negative for fever, malaise/fatigue and weight loss.  HENT: Negative for congestion and sore throat.   Eyes:       Negative for visual changes  Respiratory: Negative for cough and shortness of breath.   Cardiovascular: Negative for chest pain, palpitations and leg swelling.  Gastrointestinal: Positive for abdominal pain, bloating and constipation. Negative for anorexia, blood in stool, diarrhea, flatus, heartburn, hematochezia, hemorrhoids, melena, nausea, rectal pain and vomiting.  Genitourinary: Negative for difficulty urinating, dysuria, frequency and urgency.  Musculoskeletal: Negative for falls, joint pain and myalgias.  Skin: Negative for rash.  Neurological: Negative for dizziness, sensory change and headaches.  Endo/Heme/Allergies: Does not bruise/bleed easily.  Psychiatric/Behavioral: Negative for depression, substance abuse and suicidal ideas. The patient is not nervous/anxious.     Objective:   Vitals:   05/03/16 1418  BP: 118/78  Pulse: 82  Temp: 98.2 F (36.8 C)    Body mass index is 27.34 kg/m.   Physical Examination:  Physical Exam  Constitutional: She is oriented to person, place, and time and well-developed, well-nourished, and in no distress. No distress.  HENT:  Right Ear: External ear normal.  Left Ear: External ear normal.  Nose: Nose normal.  Mouth/Throat: No oropharyngeal exudate.  Eyes: Conjunctivae and EOM are normal. Pupils are equal, round, and reactive to light. No scleral icterus.  Neck: Normal range of motion. Neck supple. No thyromegaly present.    Cardiovascular: Normal rate, regular rhythm, normal heart sounds and intact distal pulses.   Pulmonary/Chest: Effort normal and breath sounds normal. Right breast exhibits no mass, no nipple discharge, no skin change and no tenderness. Left breast exhibits no mass, no nipple discharge, no skin change and no tenderness. Breasts are symmetrical.  Abdominal: Soft. Bowel sounds are normal. She exhibits no distension. There is no tenderness. There is no rebound and no guarding.  Genitourinary: Rectum normal.  Genitourinary Comments: Patient deferred pelvic exam to GYN  Musculoskeletal: Normal range of motion. She exhibits no edema or tenderness.  Lymphadenopathy:    She has no cervical adenopathy.  Neurological: She is alert and oriented to person, place, and time. Gait normal.  Skin: Skin is warm and dry.  Psychiatric: Affect and judgment normal.  Vitals reviewed.   ASSESSMENT and PLAN:  Delon was seen today for establish care.  Diagnoses and all orders for this visit:  Encounter for preventative adult health care examination -  Lipid panel; Future -     TSH; Future  Anemia, unspecified type -     CBC with Differential/Platelet; Future  Constipation, chronic -     Lipid panel; Future -     TSH; Future -     Ambulatory referral to Gastroenterology -     Discontinue: polyethylene glycol powder (MIRALAX) powder; Take 255 g by mouth daily as needed. -     linaclotide (LINZESS) 72 MCG capsule; Take 1 capsule (72 mcg total) by mouth daily before breakfast. -     polyethylene glycol powder (GLYCOLAX/MIRALAX) powder; Take 17 g by mouth daily as needed.  EPIGASTRIC PAIN -     Ambulatory referral to Gastroenterology -     omeprazole (PRILOSEC) 20 MG capsule; Take 1 capsule (20 mg total) by mouth daily.  Gastroesophageal reflux disease, esophagitis presence not specified -     Ambulatory referral to Gastroenterology -     omeprazole (PRILOSEC) 20 MG capsule; Take 1 capsule (20 mg  total) by mouth daily.    No problem-specific Assessment & Plan notes found for this encounter.    Recent Results (from the past 2160 hour(s))  CBC with Differential/Platelet     Status: Abnormal   Collection Time: 05/03/16  3:25 PM  Result Value Ref Range   WBC 6.9 4.0 - 10.5 K/uL   RBC 3.85 (L) 3.87 - 5.11 Mil/uL   Hemoglobin 11.2 (L) 12.0 - 15.0 g/dL   HCT 33.1 (L) 36.0 - 46.0 %   MCV 85.9 78.0 - 100.0 fl   MCHC 33.8 30.0 - 36.0 g/dL   RDW 13.5 11.5 - 15.5 %   Platelets 285.0 150.0 - 400.0 K/uL   Neutrophils Relative % 54.9 43.0 - 77.0 %   Lymphocytes Relative 34.0 12.0 - 46.0 %   Monocytes Relative 8.5 3.0 - 12.0 %   Eosinophils Relative 2.1 0.0 - 5.0 %   Basophils Relative 0.5 0.0 - 3.0 %   Neutro Abs 3.8 1.4 - 7.7 K/uL   Lymphs Abs 2.3 0.7 - 4.0 K/uL   Monocytes Absolute 0.6 0.1 - 1.0 K/uL   Eosinophils Absolute 0.1 0.0 - 0.7 K/uL   Basophils Absolute 0.0 0.0 - 0.1 K/uL  Lipid panel     Status: Abnormal   Collection Time: 05/03/16  3:25 PM  Result Value Ref Range   Cholesterol 143 0 - 200 mg/dL    Comment: ATP III Classification       Desirable:  < 200 mg/dL               Borderline High:  200 - 239 mg/dL          High:  > = 240 mg/dL   Triglycerides 83.0 0.0 - 149.0 mg/dL    Comment: Normal:  <150 mg/dLBorderline High:  150 - 199 mg/dL   HDL 37.90 (L) >39.00 mg/dL   VLDL 16.6 0.0 - 40.0 mg/dL   LDL Cholesterol 89 0 - 99 mg/dL   Total CHOL/HDL Ratio 4     Comment:                Men          Women1/2 Average Risk     3.4          3.3Average Risk          5.0          4.42X Average Risk          9.6  7.13X Average Risk          15.0          11.0                       NonHDL 105.56     Comment: NOTE:  Non-HDL goal should be 30 mg/dL higher than patient's LDL goal (i.e. LDL goal of < 70 mg/dL, would have non-HDL goal of < 100 mg/dL)  TSH     Status: None   Collection Time: 05/03/16  3:25 PM  Result Value Ref Range   TSH 1.15 0.35 - 4.50 uIU/mL   Follow up:  Return in about 1 year (around 05/03/2017), or if symptoms worsen or fail to improve, for CPE.  Wilfred Lacy, NP

## 2016-05-03 NOTE — Progress Notes (Signed)
Pre visit review using our clinic review tool, if applicable. No additional management support is needed unless otherwise documented below in the visit note. 

## 2016-05-03 NOTE — Patient Instructions (Signed)
Constipation, Adult °Constipation is when a person: °· Poops (has a bowel movement) fewer times in a week than normal. °· Has a hard time pooping. °· Has poop that is dry, hard, or bigger than normal. ° °Follow these instructions at home: °Eating and drinking ° °· Eat foods that have a lot of fiber, such as: °? Fresh fruits and vegetables. °? Whole grains. °? Beans. °· Eat less of foods that are high in fat, low in fiber, or overly processed, such as: °? French fries. °? Hamburgers. °? Cookies. °? Candy. °? Soda. °· Drink enough fluid to keep your pee (urine) clear or pale yellow. °General instructions °· Exercise regularly or as told by your doctor. °· Go to the restroom when you feel like you need to poop. Do not hold it in. °· Take over-the-counter and prescription medicines only as told by your doctor. These include any fiber supplements. °· Do pelvic floor retraining exercises, such as: °? Doing deep breathing while relaxing your lower belly (abdomen). °? Relaxing your pelvic floor while pooping. °· Watch your condition for any changes. °· Keep all follow-up visits as told by your doctor. This is important. °Contact a doctor if: °· You have pain that gets worse. °· You have a fever. °· You have not pooped for 4 days. °· You throw up (vomit). °· You are not hungry. °· You lose weight. °· You are bleeding from the anus. °· You have thin, pencil-like poop (stool). °Get help right away if: °· You have a fever, and your symptoms suddenly get worse. °· You leak poop or have blood in your poop. °· Your belly feels hard or bigger than normal (is bloated). °· You have very bad belly pain. °· You feel dizzy or you faint. °This information is not intended to replace advice given to you by your health care provider. Make sure you discuss any questions you have with your health care provider. °Document Released: 11/07/2007 Document Revised: 12/09/2015 Document Reviewed: 11/09/2015 °Elsevier Interactive Patient Education ©  2017 [employer] ° °

## 2016-05-04 NOTE — Progress Notes (Signed)
Normal results, see office note

## 2016-07-18 ENCOUNTER — Encounter: Payer: Self-pay | Admitting: Nurse Practitioner

## 2017-01-08 ENCOUNTER — Encounter (HOSPITAL_COMMUNITY): Payer: Self-pay | Admitting: Emergency Medicine

## 2017-01-08 ENCOUNTER — Emergency Department (HOSPITAL_COMMUNITY)
Admission: EM | Admit: 2017-01-08 | Discharge: 2017-01-08 | Disposition: A | Discharge status: Home / Self Care | Payer: BLUE CROSS/BLUE SHIELD | Attending: Emergency Medicine | Admitting: Emergency Medicine

## 2017-01-08 DIAGNOSIS — R1031 Right lower quadrant pain: Secondary | ICD-10-CM | POA: Diagnosis present

## 2017-01-08 DIAGNOSIS — Z5321 Procedure and treatment not carried out due to patient leaving prior to being seen by health care provider: Secondary | ICD-10-CM | POA: Diagnosis not present

## 2017-01-08 NOTE — ED Notes (Signed)
Pt called for triage, not in lobby.

## 2017-01-08 NOTE — ED Notes (Signed)
Called pt's name to update vitals, but no response from the waiting room.  

## 2017-01-08 NOTE — ED Triage Notes (Signed)
Pt c/o RLQ abdominal pain onset Wednesday, right throat and gingiva pain and swelling onset Saturday. Had pain to right lateral rib cage with inspiration, since resolved. Abscess visible behind right upper molars. In control of oral secretions. Voice quality normal. No acute distress.

## 2017-02-26 ENCOUNTER — Other Ambulatory Visit (INDEPENDENT_AMBULATORY_CARE_PROVIDER_SITE_OTHER): Payer: BLUE CROSS/BLUE SHIELD

## 2017-02-26 ENCOUNTER — Ambulatory Visit (INDEPENDENT_AMBULATORY_CARE_PROVIDER_SITE_OTHER): Payer: BLUE CROSS/BLUE SHIELD | Admitting: Nurse Practitioner

## 2017-02-26 ENCOUNTER — Encounter: Payer: Self-pay | Admitting: Nurse Practitioner

## 2017-02-26 VITALS — BP 112/74 | HR 85 | Temp 98.3°F | Ht 60.0 in | Wt 134.0 lb

## 2017-02-26 DIAGNOSIS — R55 Syncope and collapse: Secondary | ICD-10-CM | POA: Diagnosis not present

## 2017-02-26 DIAGNOSIS — K529 Noninfective gastroenteritis and colitis, unspecified: Secondary | ICD-10-CM

## 2017-02-26 LAB — CBC
HCT: 33 % — ABNORMAL LOW (ref 36.0–46.0)
HEMOGLOBIN: 10.9 g/dL — AB (ref 12.0–15.0)
MCHC: 33 g/dL (ref 30.0–36.0)
MCV: 88 fl (ref 78.0–100.0)
PLATELETS: 287 10*3/uL (ref 150.0–400.0)
RBC: 3.75 Mil/uL — ABNORMAL LOW (ref 3.87–5.11)
RDW: 13.2 % (ref 11.5–15.5)
WBC: 7.9 10*3/uL (ref 4.0–10.5)

## 2017-02-26 LAB — COMPREHENSIVE METABOLIC PANEL
ALT: 12 U/L (ref 0–35)
AST: 13 U/L (ref 0–37)
Albumin: 4.4 g/dL (ref 3.5–5.2)
Alkaline Phosphatase: 47 U/L (ref 39–117)
BILIRUBIN TOTAL: 0.4 mg/dL (ref 0.2–1.2)
BUN: 12 mg/dL (ref 6–23)
CO2: 33 meq/L — AB (ref 19–32)
Calcium: 9.4 mg/dL (ref 8.4–10.5)
Chloride: 100 mEq/L (ref 96–112)
Creatinine, Ser: 0.77 mg/dL (ref 0.40–1.20)
GFR: 112.15 mL/min (ref >60.00)
GLUCOSE: 79 mg/dL (ref 70–99)
Potassium: 3.4 mEq/L — ABNORMAL LOW (ref 3.5–5.1)
SODIUM: 138 meq/L (ref 135–145)
Total Protein: 6.9 g/dL (ref 6.0–8.3)

## 2017-02-26 LAB — POCT URINALYSIS DIPSTICK
Bilirubin, UA: NEGATIVE
Glucose, UA: NEGATIVE
Ketones, UA: NEGATIVE
LEUKOCYTES UA: NEGATIVE
NITRITE UA: NEGATIVE
PH UA: 6 (ref 5.0–8.0)
PROTEIN UA: NEGATIVE
Spec Grav, UA: 1.02 (ref 1.010–1.025)
UROBILINOGEN UA: 1 U/dL

## 2017-02-26 LAB — POCT URINE PREGNANCY: PREG TEST UR: NEGATIVE

## 2017-02-26 NOTE — Patient Instructions (Addendum)
No acute finding on urinalysis. Negative pregnancy. Normal ECG.  Push adequate oral hydration.  stable labs except mild decrease in potassium and persistent anemia Encourage oral hydration with use of gartorade and water maintain bland diet x 2days, he advance as tolerated. Syncopal episode due to vasovagal syndrome secondary to gastroenteritis.  Vasovagal Syncope, Adult Syncope, which is commonly known as fainting or passing out, is a temporary loss of consciousness. It occurs when the blood flow to the brain is reduced. Vasovagal syncope, also called neurocardiogenic syncope, is a fainting spell that happens when blood flow to the brain is reduced because of a sudden drop in heart rate and blood pressure. Vasovagal syncope is usually harmless. However, you can get injured if you fall during a fainting spell. What are the causes? This condition is caused by a drop in heart rate and blood pressure, usually in response to a trigger. Many things and situations can trigger an episode, including:  Pain.  Fear.  The sight of blood. This may occur during medical procedures, such as when blood is being drawn from a vein.  Common activities, such as coughing, swallowing, stretching, or going to the bathroom.  Emotional stress.  Being in a confined space.  Prolonged standing, especially in a warm environment.  Lack of sleep or rest.  Not eating for a long time.  Not drinking enough liquids.  Recent illness.  Drinking alcohol.  Taking drugs that affect blood pressure, such as marijuana, cocaine, opiates, or inhalants.  What are the signs or symptoms? Before a fainting episode, you may:  Feel dizzy or light-headed.  Become pale.  Sense that you are going to faint.  Feel like the room is spinning.  Only see directly ahead (tunnel vision).  Feel sick to your stomach (nauseous).  See spots.  Slowly lose vision.  Hear ringing in your ears.  Have a headache.  Feel  warm and sweaty.  Feel a sensation of pins and needles.  During the fainting spell, you may twitch or make jerky movements. Fainting spells usually last no longer than a few minutes before you wake up. If you get up too quickly before your body can recover, you may faint again. How is this diagnosed? This condition is diagnosed based on your symptoms, your medical history, and a physical exam. Tests may be done to rule out other causes of fainting. Tests may include:  Blood tests.  Heart tests, such as an electrocardiogram (ECG), echocardiogram, or electrophysiology study.  A test to check your response to changes in position (tilt table test).  How is this treated? Usually, treatment is not needed for this condition. Your health care provider may suggest ways to help prevent fainting episodes. These may include:  Drinking additional fluids if you are exposed to a trigger.  Sitting or lying down if you notice signs that an episode is coming.  If your fainting spells continue, your health care provider may recommend that you:  Take medicines to prevent fainting or to help reduce further episodes of fainting.  Do certain exercises.  Wear compression stockings.  Have surgery to place a pacemaker in your body (rare).  Follow these instructions at home:  Learn to identify the signs that an episode is coming.  Sit or lie down at the first sign of a fainting spell. If you sit down, put your head down between your legs. If you lie down, swing your legs up in the air to increase blood flow to the brain.  Avoid hot tubs and saunas.  Avoid standing for a long time. If you have to stand for a long time, try: ? Crossing your legs. ? Flexing and stretching your leg muscles. ? Squatting. ? Moving your legs. ? Bending over.  Drink enough fluid to keep your urine clear or pale yellow.  Make changes to your diet that your health care provider recommends. You may be told to: ? Avoid  caffeine. ? Eat more salt.  Take over-the-counter and prescription medicines only as told by your health care provider. Contact a health care provider if:  You continue to have fainting spells despite treatment.  You faint more often despite treatment.  You lose consciousness for more than a few minutes.  You faint during or after exercising or after being startled.  You have twitching or jerky movements for longer than a few seconds during a fainting spell.  You have an episode of twitching or jerky movements without fainting. Get help right away if:  A fainting spell leads to an injury or bleeding.  You have new symptoms that occur with the fainting spells, such as: ? Shortness of breath. ? Chest pain. ? Irregular heartbeat.  You twitch or make jerky movements for more than 5 minutes.  You twitch or make jerky movements during more than one fainting spell. This information is not intended to replace advice given to you by your health care provider. Make sure you discuss any questions you have with your health care provider. Document Released: 05/07/2012 Document Revised: 11/02/2015 Document Reviewed: 03/19/2015 Elsevier Interactive Patient Education  [employer].

## 2017-02-26 NOTE — Progress Notes (Signed)
Subjective:  Patient ID: Ashley Cole, female    DOB: Jan 06, 1986  Age: 31 y.o. MRN: [id number]  CC: GI Problem (diarrhea and vomitting last night--weak--happened last night/ headache--had blood in urine a while back ago--saw urology--no treatment. still passing blood in urine--low iron )   Loss of Consciousness  This is a new problem. The current episode started yesterday. The problem has been resolved. She lost consciousness for a period of less than 1 minute. The symptoms are aggravated by defecation. Associated symptoms include diaphoresis, dizziness, light-headedness, malaise/fatigue, nausea, palpitations, vomiting and weakness. Pertinent negatives include no abdominal pain, bladder incontinence, bowel incontinence, confusion, fever, focal sensory loss, focal weakness, headaches, slurred speech, vertigo or visual change. She has tried bed rest for the symptoms. The treatment provided significant relief. There is no history of arrhythmia, CAD, a clotting disorder, CVA, DM, HTN, seizures, a sudden death in family, TIA or vertigo.  Diarrhea   This is a new problem. The current episode started yesterday. The problem occurs less than 2 times per day. The problem has been resolved. The stool consistency is described as watery. The patient states that diarrhea awakens her from sleep. Associated symptoms include vomiting. Pertinent negatives include no abdominal pain, bloating, chills, coughing, fever, headaches, increased  flatus, myalgias or URI. There are no known risk factors. Her past medical history is significant for irritable bowel syndrome.    Outpatient Medications Prior to Visit  Medication Sig Dispense Refill  . ibuprofen (ADVIL,MOTRIN) 200 MG tablet Take 400 mg by mouth every 6 (six) hours as needed for fever, headache, mild pain, moderate pain or cramping.    Marland Kitchen omeprazole (PRILOSEC) 20 MG capsule Take 1 capsule (20 mg total) by mouth daily. 30 capsule 3  . linaclotide (LINZESS) 72 MCG  capsule Take 1 capsule (72 mcg total) by mouth daily before breakfast. (Patient not taking: Reported on 02/26/2017) 16 capsule 0  . polyethylene glycol powder (GLYCOLAX/MIRALAX) powder Take 17 g by mouth daily as needed. (Patient not taking: Reported on 02/26/2017) 3350 g 1   No facility-administered medications prior to visit.     ROS See HPI  Objective:  BP 112/74   Pulse 85   Temp 98.3 F (36.8 C)   Ht 5' (1.524 m)   Wt 134 lb (60.8 kg)   SpO2 98%   BMI 26.17 kg/m   BP Readings from Last 3 Encounters:  02/26/17 112/74  01/08/17 122/73  05/03/16 118/78    Wt Readings from Last 3 Encounters:  02/26/17 134 lb (60.8 kg)  01/08/17 138 lb (62.6 kg)  05/03/16 140 lb (63.5 kg)    Physical Exam  Constitutional: She is oriented to person, place, and time. No distress.  Cardiovascular: Normal rate, regular rhythm and normal heart sounds.   Pulmonary/Chest: Effort normal and breath sounds normal.  Abdominal: Soft. Bowel sounds are normal. She exhibits no distension. There is no tenderness.  Lymphadenopathy:    She has no cervical adenopathy.  Neurological: She is alert and oriented to person, place, and time. No cranial nerve deficit.  Skin: Skin is warm and dry.  Psychiatric: She has a normal mood and affect. Her behavior is normal.  Vitals reviewed.   Lab Results  Component Value Date   WBC 7.9 02/26/2017   HGB 10.9 (L) 02/26/2017   HCT 33.0 (L) 02/26/2017   PLT 287.0 02/26/2017   GLUCOSE 79 02/26/2017   CHOL 143 05/03/2016   TRIG 83.0 05/03/2016   HDL 37.90 (L) 05/03/2016  LDLCALC 89 05/03/2016   ALT 12 02/26/2017   AST 13 02/26/2017   NA 138 02/26/2017   K 3.4 (L) 02/26/2017   CL 100 02/26/2017   CREATININE 0.77 02/26/2017   BUN 12 02/26/2017   CO2 33 (H) 02/26/2017   TSH 1.15 05/03/2016   ECG: NSR, no ST segment of T wave abnormality.  Assessment & Plan:   Ashley Cole was seen today for gi problem.  Diagnoses and all orders for this  visit:  Gastroenteritis -     POCT urine pregnancy -     POCT urinalysis dipstick -     EKG 12-Lead -     Comprehensive metabolic panel; Future -     CBC; Future  Vasovagal episode -     POCT urine pregnancy -     POCT urinalysis dipstick -     EKG 12-Lead -     Comprehensive metabolic panel; Future -     CBC; Future   I am having Ashley Cole maintain her ibuprofen, linaclotide, omeprazole, polyethylene glycol powder, citalopram, and IRON PO.  Meds ordered this encounter  Medications  . citalopram (CELEXA) 20 MG tablet  . IRON PO    Sig: iron    Follow-up: Return if symptoms worsen or fail to improve.  Wilfred Lacy, NP

## 2017-02-27 ENCOUNTER — Telehealth: Payer: Self-pay | Admitting: Nurse Practitioner

## 2017-02-27 NOTE — Telephone Encounter (Signed)
Patient called back to get lab results.  Would like a call back between 11:10 and 11:45am due to being at work.

## 2017-02-28 NOTE — Telephone Encounter (Signed)
Pt is aware.  

## 2017-05-07 ENCOUNTER — Ambulatory Visit (INDEPENDENT_AMBULATORY_CARE_PROVIDER_SITE_OTHER): Payer: BLUE CROSS/BLUE SHIELD | Admitting: Nurse Practitioner

## 2017-05-07 ENCOUNTER — Ambulatory Visit: Payer: BLUE CROSS/BLUE SHIELD | Admitting: Nurse Practitioner

## 2017-05-07 ENCOUNTER — Encounter: Payer: Self-pay | Admitting: Nurse Practitioner

## 2017-05-07 VITALS — BP 116/70 | HR 85 | Temp 98.5°F | Ht 60.0 in | Wt 133.0 lb

## 2017-05-07 DIAGNOSIS — R51 Headache: Secondary | ICD-10-CM | POA: Diagnosis not present

## 2017-05-07 DIAGNOSIS — F39 Unspecified mood [affective] disorder: Secondary | ICD-10-CM | POA: Diagnosis not present

## 2017-05-07 DIAGNOSIS — G8929 Other chronic pain: Secondary | ICD-10-CM

## 2017-05-07 DIAGNOSIS — F4322 Adjustment disorder with anxiety: Secondary | ICD-10-CM | POA: Insufficient documentation

## 2017-05-07 DIAGNOSIS — Z7251 High risk heterosexual behavior: Secondary | ICD-10-CM

## 2017-05-07 LAB — POCT URINE PREGNANCY: PREG TEST UR: NEGATIVE

## 2017-05-07 MED ORDER — SUMATRIPTAN SUCCINATE 50 MG PO TABS: 50.0000 mg | ORAL_TABLET | Freq: Three times a day (TID) | ORAL | 0 refills | Status: DC | PRN

## 2017-05-07 NOTE — Patient Instructions (Addendum)
You will be contacted to schedule appt with [facility].  Return to office sooner than 63month if no improvement in headache in 2weeks.  Discuss FMLA paperwork with [facility] or with [facility] when scheduled.  Return to office sooner if mood worsens or will like to try another medication while waiting to be seen by [facility].  General Headache Without Cause A headache is pain or discomfort felt around the head or neck area. There are many causes and types of headaches. In some cases, the cause may not be found. Follow these instructions at home: Managing pain  Take over-the-counter and prescription medicines only as told by your doctor.  Lie down in a dark, quiet room when you have a headache.  If directed, apply ice to the head and neck area: ? Put ice in a plastic bag. ? Place a towel between your skin and the bag. ? Leave the ice on for 20 minutes, 2-3 times per day.  Use a heating pad or hot shower to apply heat to the head and neck area as told by your doctor.  Keep lights dim if bright lights bother you or make your headaches worse. Eating and drinking  Eat meals on a regular schedule.  Lessen how much alcohol you drink.  Lessen how much caffeine you drink, or stop drinking caffeine. General instructions  Keep all follow-up visits as told by your doctor. This is important.  Keep a journal to find out if certain things bring on headaches. For example, write down: ? What you eat and drink. ? How much sleep you get. ? Any change to your diet or medicines.  Relax by getting a massage or doing other relaxing activities.  Lessen stress.  Sit up straight. Do not tighten (tense) your muscles.  Do not use tobacco products. This includes cigarettes, chewing tobacco, or e-cigarettes. If you need help quitting, ask your doctor.  Exercise regularly as told by your doctor.  Get enough sleep. This often means 7-9 hours of  sleep. Contact a doctor if:  Your symptoms are not helped by medicine.  You have a headache that feels different than the other headaches.  You feel sick to your stomach (nauseous) or you throw up (vomit).  You have a fever. Get help right away if:  Your headache becomes really bad.  You keep throwing up.  You have a stiff neck.  You have trouble seeing.  You have trouble speaking.  You have pain in the eye or ear.  Your muscles are weak or you lose muscle control.  You lose your balance or have trouble walking.  You feel like you will pass out (faint) or you pass out.  You have confusion. This information is not intended to replace advice given to you by your health care provider. Make sure you discuss any questions you have with your health care provider. Document Released: 02/28/2008 Document Revised: 10/27/2015 Document Reviewed: 09/13/2014 Elsevier Interactive Patient Education  [employer].

## 2017-05-07 NOTE — Progress Notes (Addendum)
Subjective:  Patient ID: Ashley Cole, female    DOB: 1985/12/28  Age: 31 y.o. MRN: [id number]  CC: Migraine (migrain 3 a wk and anxiety, stress out from work--alot going on right now.FMLA consult seeing therapist but not good experience. )   Headache   This is a chronic problem. The current episode started more than 1 year ago (ongoing for 11yrs). The problem occurs daily. The problem has been unchanged. The pain is located in the frontal and occipital region. The pain does not radiate. The pain quality is similar to prior headaches. The quality of the pain is described as aching and dull. Associated symptoms include insomnia and muscle aches. Pertinent negatives include no abdominal pain, abnormal behavior, anorexia, blurred vision, dizziness, drainage, eye pain, eye redness, eye watering, facial sweating, fever, hearing loss, loss of balance, neck pain, numbness, phonophobia, photophobia, rhinorrhea, scalp tenderness, sinus pressure, sore throat, swollen glands, tingling, tinnitus, visual change or weakness. The symptoms are aggravated by emotional stress. She has tried acetaminophen, NSAIDs and Excedrin for the symptoms. The treatment provided mild relief. Her past medical history is significant for migraine headaches and migraines in the family. There is no history of hypertension, immunosuppression, recent head traumas, sinus disease or TMJ.    Mood disorder: She reports she has been seeing [facility] since 01/2017, but is not happy with care. Reports she was diagnosed with anxiety and depression. Prescribed celexa, took for 27months then stopped. states medication was not helpful. She denies SI or HI. Reports increased due to demanding responsibilities at work and at home. Reports she works 80hrs a week and takes care of 3 children. She does not want any medication at this time.  Depression screen [facility] 2/9 02/26/2017 05/03/2016  Decreased Interest 2 0  Down, Depressed,  Hopeless 2 0  PHQ - 2 Score 4 0  Altered sleeping 2 -  Tired, decreased energy 3 -  Change in appetite 1 -  Feeling bad or failure about yourself  0 -  Trouble concentrating 0 -  Moving slowly or fidgety/restless 0 -  Suicidal thoughts 0 -  PHQ-9 Score 10 -    Outpatient Medications Prior to Visit  Medication Sig Dispense Refill  . citalopram (CELEXA) 20 MG tablet     . ibuprofen (ADVIL,MOTRIN) 200 MG tablet Take 400 mg by mouth every 6 (six) hours as needed for fever, headache, mild pain, moderate pain or cramping.    . IRON PO iron    . linaclotide (LINZESS) 72 MCG capsule Take 1 capsule (72 mcg total) by mouth daily before breakfast. 16 capsule 0  . omeprazole (PRILOSEC) 20 MG capsule Take 1 capsule (20 mg total) by mouth daily. 30 capsule 3  . polyethylene glycol powder (GLYCOLAX/MIRALAX) powder Take 17 g by mouth daily as needed. 3350 g 1   No facility-administered medications prior to visit.     ROS See HPI  Objective:  BP 116/70   Pulse 85   Temp 98.5 F (36.9 C)   Ht 5' (1.524 m)   Wt 133 lb (60.3 kg)   SpO2 99%   BMI 25.97 kg/m   BP Readings from Last 3 Encounters:  05/07/17 116/70  02/26/17 112/74  01/08/17 122/73    Wt Readings from Last 3 Encounters:  05/07/17 133 lb (60.3 kg)  02/26/17 134 lb (60.8 kg)  01/08/17 138 lb (62.6 kg)    Physical Exam  Constitutional: She is oriented to person, place, and time. No distress.  HENT:  Right Ear: External ear normal.  Left Ear: External ear normal.  Nose: Nose normal. No mucosal edema. Right sinus exhibits no maxillary sinus tenderness and no frontal sinus tenderness. Left sinus exhibits no maxillary sinus tenderness and no frontal sinus tenderness.  Mouth/Throat: Oropharynx is clear and moist. No oropharyngeal exudate.  Eyes: Conjunctivae and EOM are normal. Pupils are equal, round, and reactive to light.  Neck: Normal range of motion. Neck supple. No thyromegaly present.  Cardiovascular: Normal rate,  regular rhythm and normal heart sounds.  Pulmonary/Chest: Effort normal and breath sounds normal.  Musculoskeletal: She exhibits no edema.  Lymphadenopathy:    She has no cervical adenopathy.  Neurological: She is alert and oriented to person, place, and time.  Psychiatric: Her speech is normal. Thought content normal. Her mood appears anxious. Her affect is angry and labile. She is agitated. Cognition and memory are normal.  Vitals reviewed.   Lab Results  Component Value Date   WBC 7.9 02/26/2017   HGB 10.9 (L) 02/26/2017   HCT 33.0 (L) 02/26/2017   PLT 287.0 02/26/2017   GLUCOSE 79 02/26/2017   CHOL 143 05/03/2016   TRIG 83.0 05/03/2016   HDL 37.90 (L) 05/03/2016   LDLCALC 89 05/03/2016   ALT 12 02/26/2017   AST 13 02/26/2017   NA 138 02/26/2017   K 3.4 (L) 02/26/2017   CL 100 02/26/2017   CREATININE 0.77 02/26/2017   BUN 12 02/26/2017   CO2 33 (H) 02/26/2017   TSH 1.15 05/03/2016    No results found.  Assessment & Plan:   Zellie was seen today for migraine.  Diagnoses and all orders for this visit:  Chronic intractable headache, unspecified headache type -     POCT urine pregnancy -     SUMAtriptan (IMITREX) 50 MG tablet; Take 1 tablet (50 mg total) by mouth every 8 (eight) hours as needed for migraine.  Mood disorder ([place]) -     Ambulatory referral to Psychology  Unprotected sexual intercourse -     POCT urine pregnancy   I am having Alisson L. Mandrell start on SUMAtriptan. I am also having her maintain her ibuprofen, linaclotide, omeprazole, polyethylene glycol powder, citalopram, and IRON PO.  Meds ordered this encounter  Medications  . SUMAtriptan (IMITREX) 50 MG tablet    Sig: Take 1 tablet (50 mg total) by mouth every 8 (eight) hours as needed for migraine.    Dispense:  10 tablet    Refill:  0    Order Specific Question:   Supervising Provider    Answer:   Lucille Passy [[id number]]    Follow-up: Return in about 5 weeks (around 06/10/2017) for headache  and anxiety.Wilfred Lacy, NP

## 2017-05-08 ENCOUNTER — Telehealth: Payer: Self-pay | Admitting: Nurse Practitioner

## 2017-05-08 NOTE — Telephone Encounter (Signed)
Copied from [place] [id number]. Topic: General - Other >> May 08, 2017  1:27 PM Valla Leaver wrote: Reason for CRM: Saw NP Nche yesterday 12/04 and forgot to ask for a letter for work. Please call back to assist.  ---left vm for the patient to call  Back, need a little more information.

## 2017-05-09 ENCOUNTER — Encounter: Payer: Self-pay | Admitting: Nurse Practitioner

## 2017-05-09 NOTE — Telephone Encounter (Signed)
Left 2nd vm for the pt to call back, need to know if the letter just need to say she was here for the appt?

## 2017-05-09 NOTE — Telephone Encounter (Signed)
Pt returning a phone call, she states she just needs a dr note stating that she was there for a dr appt.

## 2017-05-09 NOTE — Telephone Encounter (Signed)
Letter created, waiting for the pt to let me know of fax # to fax to.

## 2017-05-15 NOTE — Telephone Encounter (Signed)
Pt stated she will call in with the fax # when she needs this letter.

## 2017-05-15 NOTE — Telephone Encounter (Signed)
Pt stated she will call in with the fa

## 2017-05-30 ENCOUNTER — Ambulatory Visit: Payer: Self-pay | Admitting: *Deleted

## 2017-05-30 NOTE — Telephone Encounter (Signed)
Attepmted to contact contact pt regarding reports of syncope; left message on voice mail for pt to call office [phone]; unable to complete nurse triage assessment.

## 2017-06-07 ENCOUNTER — Ambulatory Visit (INDEPENDENT_AMBULATORY_CARE_PROVIDER_SITE_OTHER): Payer: BLUE CROSS/BLUE SHIELD | Admitting: Internal Medicine

## 2017-06-07 ENCOUNTER — Encounter: Payer: Self-pay | Admitting: Internal Medicine

## 2017-06-07 VITALS — BP 106/76 | Temp 99.0°F | Ht 60.0 in | Wt 135.0 lb

## 2017-06-07 DIAGNOSIS — R6889 Other general symptoms and signs: Secondary | ICD-10-CM | POA: Diagnosis not present

## 2017-06-07 DIAGNOSIS — J019 Acute sinusitis, unspecified: Secondary | ICD-10-CM | POA: Diagnosis not present

## 2017-06-07 LAB — POCT INFLUENZA A/B
Influenza A, POC: NEGATIVE
Influenza B, POC: NEGATIVE

## 2017-06-07 MED ORDER — AZITHROMYCIN 250 MG PO TABS: ORAL_TABLET | ORAL | 1 refills | Status: DC

## 2017-06-07 MED ORDER — HYDROCODONE-HOMATROPINE 5-1.5 MG/5ML PO SYRP: 5.0000 mL | ORAL_SOLUTION | Freq: Four times a day (QID) | ORAL | 0 refills | Status: DC | PRN

## 2017-06-07 MED ORDER — HYDROCODONE-HOMATROPINE 5-1.5 MG/5ML PO SYRP: 5.0000 mL | ORAL_SOLUTION | Freq: Four times a day (QID) | ORAL | 0 refills | Status: AC | PRN

## 2017-06-07 NOTE — Patient Instructions (Signed)
Please take all new medication as prescribed - the antibiotic, and cough medicine if needed  Please continue all other medications as before, and refills have been done if requested.  Please have the pharmacy call with any other refills you may need.  Please keep your appointments with your specialists as you may have planned     

## 2017-06-07 NOTE — Progress Notes (Signed)
Subjective:    Patient ID: Ashley Cole, female    DOB: 1986/03/07, 32 y.o.   MRN: [id number]  HPI   Here with 2-3 days acute onset fever, facial pain, pressure, headache, general weakness and malaise, and greenish d/c, with mild ST and cough, but pt denies chest pain, wheezing, increased sob or doe, orthopnea, PND, increased LE swelling, palpitations, dizziness or syncope.  Her 32yo became ill this am as well and will be seeing his peds. Past Medical History:  Diagnosis Date  . Abnormal Pap smear   . Anemia   . Anxiety   . Chlamydia   . GERD (gastroesophageal reflux disease)   . Headache(784.0)   . Human papilloma virus    cells removed x2  . Spinal headache   . Trichomonas   . Urinary tract infection   . Vaginal Pap smear, abnormal    Past Surgical History:  Procedure Laterality Date  . DILATION AND CURETTAGE OF UTERUS    . GYNECOLOGIC CRYOSURGERY    . PILONIDAL CYST EXCISION    . THERAPEUTIC ABORTION      reports that she has been smoking cigarettes.  She has a 3.50 pack-year smoking history. she has never used smokeless tobacco. She reports that she drinks alcohol. She reports that she does not use drugs. family history includes Cancer in her maternal grandfather and mother; Fibroids in her mother; Hypertension in her mother; Thyroid disease in her brother. No Known Allergies Current Outpatient Medications on File Prior to Visit  Medication Sig Dispense Refill  . citalopram (CELEXA) 20 MG tablet     . ibuprofen (ADVIL,MOTRIN) 200 MG tablet Take 400 mg by mouth every 6 (six) hours as needed for fever, headache, mild pain, moderate pain or cramping.    . IRON PO iron    . linaclotide (LINZESS) 72 MCG capsule Take 1 capsule (72 mcg total) by mouth daily before breakfast. 16 capsule 0  . omeprazole (PRILOSEC) 20 MG capsule Take 1 capsule (20 mg total) by mouth daily. 30 capsule 3  . polyethylene glycol powder (GLYCOLAX/MIRALAX) powder Take 17 g by mouth daily as needed. 3350  g 1  . SUMAtriptan (IMITREX) 50 MG tablet Take 1 tablet (50 mg total) by mouth every 8 (eight) hours as needed for migraine. 10 tablet 0   No current facility-administered medications on file prior to visit.    Review of Systems All otherwise neg per pt    Objective:   Physical Exam BP 106/76   Temp 99 F (37.2 C) (Oral)   Ht 5' (1.524 m)   Wt 135 lb (61.2 kg)   BMI 26.37 kg/m  VS noted, mild ill  Constitutional: Pt appears in NAD HENT: Head: NCAT.  Right Ear: External ear normal.  Left Ear: External ear normal.  Bilat tm's with mild erythema.  Max sinus areas mild tender.  Pharynx with mild erythema, no exudate Eyes: . Pupils are equal, round, and reactive to light. Conjunctivae and EOM are normal Nose: without d/c or deformity Neck: Neck supple. Gross normal ROM with bilat submandib LA tender Cardiovascular: Normal rate and regular rhythm.   Pulmonary/Chest: Effort normal and breath sounds without rales or wheezing.  Abd:  Soft, NT, ND, + BS, no organomegaly Neurological: Pt is alert. At baseline orientation, motor grossly intact Skin: Skin is warm. No rashes, other new lesions, no LE edema Psychiatric: Pt behavior is normal without agitation  No other exam findings POCT Influenza A/B  Order: [id number]  Status:  Final result Visible to patient:  No (Not Released) Dx:  Flu-like symptoms   Ref Range & Units 09:26  Influenza A, POC Negative Negative   Influenza B, POC Negative Negative            Assessment & Plan:

## 2017-06-07 NOTE — Assessment & Plan Note (Signed)
Mild to mod, for antibx course,  to f/u any worsening symptoms or concerns 

## 2017-06-11 ENCOUNTER — Ambulatory Visit: Payer: BLUE CROSS/BLUE SHIELD | Admitting: Nurse Practitioner

## 2017-06-28 ENCOUNTER — Ambulatory Visit: Payer: BLUE CROSS/BLUE SHIELD | Admitting: Psychology

## 2017-07-04 ENCOUNTER — Ambulatory Visit: Payer: BLUE CROSS/BLUE SHIELD | Admitting: Psychology

## 2017-07-15 ENCOUNTER — Ambulatory Visit: Payer: BLUE CROSS/BLUE SHIELD | Admitting: Psychology

## 2017-07-15 DIAGNOSIS — F321 Major depressive disorder, single episode, moderate: Secondary | ICD-10-CM | POA: Diagnosis not present

## 2017-07-15 DIAGNOSIS — F411 Generalized anxiety disorder: Secondary | ICD-10-CM | POA: Diagnosis not present

## 2017-07-29 ENCOUNTER — Ambulatory Visit: Payer: BLUE CROSS/BLUE SHIELD | Admitting: Psychology

## 2017-08-07 ENCOUNTER — Other Ambulatory Visit: Payer: Self-pay | Admitting: Allergy

## 2017-08-07 ENCOUNTER — Ambulatory Visit
Admission: RE | Admit: 2017-08-07 | Discharge: 2017-08-07 | Disposition: A | Discharge status: Home / Self Care | Payer: BLUE CROSS/BLUE SHIELD | Source: Ambulatory Visit | Attending: Allergy | Admitting: Allergy

## 2017-08-07 DIAGNOSIS — R059 Cough, unspecified: Secondary | ICD-10-CM

## 2017-08-07 DIAGNOSIS — R05 Cough: Secondary | ICD-10-CM

## 2017-08-12 ENCOUNTER — Ambulatory Visit: Payer: BLUE CROSS/BLUE SHIELD | Admitting: Psychology

## 2017-08-12 DIAGNOSIS — F411 Generalized anxiety disorder: Secondary | ICD-10-CM

## 2017-08-12 DIAGNOSIS — F321 Major depressive disorder, single episode, moderate: Secondary | ICD-10-CM | POA: Diagnosis not present

## 2017-08-25 ENCOUNTER — Encounter (HOSPITAL_BASED_OUTPATIENT_CLINIC_OR_DEPARTMENT_OTHER): Payer: Self-pay | Admitting: *Deleted

## 2017-08-25 ENCOUNTER — Other Ambulatory Visit: Payer: Self-pay

## 2017-08-25 ENCOUNTER — Emergency Department (HOSPITAL_BASED_OUTPATIENT_CLINIC_OR_DEPARTMENT_OTHER)
Admission: EM | Admit: 2017-08-25 | Discharge: 2017-08-26 | Disposition: A | Discharge status: Home / Self Care | Payer: BLUE CROSS/BLUE SHIELD | Attending: Emergency Medicine | Admitting: Emergency Medicine

## 2017-08-25 DIAGNOSIS — R112 Nausea with vomiting, unspecified: Secondary | ICD-10-CM | POA: Insufficient documentation

## 2017-08-25 DIAGNOSIS — R55 Syncope and collapse: Secondary | ICD-10-CM | POA: Diagnosis not present

## 2017-08-25 DIAGNOSIS — F17210 Nicotine dependence, cigarettes, uncomplicated: Secondary | ICD-10-CM | POA: Diagnosis not present

## 2017-08-25 DIAGNOSIS — L299 Pruritus, unspecified: Secondary | ICD-10-CM | POA: Diagnosis not present

## 2017-08-25 DIAGNOSIS — R0602 Shortness of breath: Secondary | ICD-10-CM | POA: Diagnosis not present

## 2017-08-25 DIAGNOSIS — T7840XA Allergy, unspecified, initial encounter: Secondary | ICD-10-CM | POA: Diagnosis not present

## 2017-08-25 DIAGNOSIS — R197 Diarrhea, unspecified: Secondary | ICD-10-CM | POA: Diagnosis not present

## 2017-08-25 DIAGNOSIS — R21 Rash and other nonspecific skin eruption: Secondary | ICD-10-CM | POA: Diagnosis not present

## 2017-08-25 DIAGNOSIS — Z79899 Other long term (current) drug therapy: Secondary | ICD-10-CM | POA: Diagnosis not present

## 2017-08-25 NOTE — ED Triage Notes (Signed)
Pt states she has been dealing with these episodes x 6 since Sept. Sat allergist, who could not determine what she was allergic to. Told to come to ED if she had an allergic reaction again and get Triptase level drawn. EMS came to house and told pt to come to ED as well. This episode started about 1 hour ago and pt states she was itching on hands and feet, diarrhea, couldn't get her breath. Did Auvi-Q PTA. Taken to ED 1 and placed on monitor. IV being initiated. No distress.

## 2017-08-26 ENCOUNTER — Ambulatory Visit: Payer: BLUE CROSS/BLUE SHIELD | Admitting: Psychology

## 2017-08-26 DIAGNOSIS — F411 Generalized anxiety disorder: Secondary | ICD-10-CM

## 2017-08-26 DIAGNOSIS — F321 Major depressive disorder, single episode, moderate: Secondary | ICD-10-CM | POA: Diagnosis not present

## 2017-08-26 MED ORDER — DIPHENHYDRAMINE HCL 25 MG PO CAPS
25.0000 mg | ORAL_CAPSULE | Freq: Once | ORAL | Status: AC
  Administered 2017-08-26: 25 mg via ORAL
  Filled 2017-08-26: qty 1

## 2017-08-26 MED ORDER — FAMOTIDINE 20 MG PO TABS
20.0000 mg | ORAL_TABLET | Freq: Once | ORAL | Status: AC
  Administered 2017-08-26: 20 mg via ORAL
  Filled 2017-08-26: qty 1

## 2017-08-26 MED ORDER — EPINEPHRINE 0.3 MG/0.3ML IJ SOAJ: 0.3000 mg | Freq: Once | INTRAMUSCULAR | 0 refills | Status: AC

## 2017-08-26 MED ORDER — PREDNISONE 20 MG PO TABS: 40.0000 mg | ORAL_TABLET | Freq: Every day | ORAL | 0 refills | Status: DC

## 2017-08-26 MED ORDER — PREDNISONE 50 MG PO TABS
60.0000 mg | ORAL_TABLET | Freq: Once | ORAL | Status: AC
  Administered 2017-08-26: 60 mg via ORAL
  Filled 2017-08-26: qty 1

## 2017-08-26 NOTE — ED Notes (Signed)
EDP into room, prior to RN assessment, see MD notes, pending orders.   

## 2017-08-26 NOTE — ED Notes (Signed)
Alert, NAD, calm, interactive, resps e/u, speaking in clear complete sentences, no dyspnea noted, skin W&D, VSS, here fpor allergic reaction, onset at ~ 2200, recent h/o similar, has seen Dr. Donneta Romberg A/I, no known culprit, reported initial onset of lip swelling, hands and feet itching, "lip swelling resolved quickly after using my AuviQ in L thigh", states, "hands and feet itching resolving now", (currently denies: pain, sob, nausea, rash, sore throat, swelling, dizziness or visual changes). Family at [facility]

## 2017-08-26 NOTE — ED Notes (Signed)
EDP into see 

## 2017-08-26 NOTE — ED Notes (Signed)
No changes, alert, NAD, calm, interactive, no complaints at this time, VSS.

## 2017-08-26 NOTE — ED Provider Notes (Signed)
[place] EMERGENCY DEPARTMENT Provider Note   CSN: [id number] Arrival date & time: 08/25/17  2339     History   Chief Complaint Chief Complaint  Patient presents with  . Allergic Reaction    HPI Ashley Cole is a 32 y.o. female.  HPI  This is a 32 year old female who presents with allergic reaction.  Patient reports recent history since September of multiple allergic type episodes.  She has seen an allergist and was tested for multiple allergies but this came up negative.  She reports episodes with hand and foot swelling, redness, itching followed by profuse vomiting and diarrhea.  She reports shortness of breath and near syncope.  She had a similar episode approximately 1 hour prior to arrival.  She gave herself an epinephrine injection and most of her symptoms resolved.  She is unable to identify any eliciting foods or allergens.  Epinephrine given at approximately 11 PM.  She reports that her allergist requested that she be seen in the ER and have a tryptase level taken if she were having a reaction.  Past Medical History:  Diagnosis Date  . Abnormal Pap smear   . Anemia   . Anxiety   . Chlamydia   . GERD (gastroesophageal reflux disease)   . Headache(784.0)   . Human papilloma virus    cells removed x2  . Spinal headache   . Trichomonas   . Urinary tract infection   . Vaginal Pap smear, abnormal     Patient Active Problem List   Diagnosis Date Noted  . Acute sinus infection 06/07/2017  . Anxious mood as adjustment reaction 05/07/2017  . Family history of cervical cancer 05/03/2016  . Family history of ovarian cancer 05/03/2016  . Anemia 05/03/2016  . GASTROESOPHAGEAL REFLUX DISEASE 07/09/2007  . ABDOMINAL PAIN 07/09/2007  . EPIGASTRIC PAIN 07/09/2007    Past Surgical History:  Procedure Laterality Date  . DILATION AND CURETTAGE OF UTERUS    . GYNECOLOGIC CRYOSURGERY    . PILONIDAL CYST EXCISION    . THERAPEUTIC ABORTION       OB History      Gravida  7   Para  3   Term  3   Preterm  0   AB  4   Living  3     SAB  0   TAB  4   Ectopic  0   Multiple  0   Live Births  1            Home Medications    Prior to Admission medications   Medication Sig Start Date End Date Taking? Authorizing Provider  albuterol (PROVENTIL HFA;VENTOLIN HFA) 108 (90 Base) MCG/ACT inhaler Inhale 2 puffs into the lungs every 6 (six) hours as needed for wheezing or shortness of breath.   Yes [provider]  azelastine (OPTIVAR) 0.05 % ophthalmic solution 1 drop 2 (two) times daily as needed.   Yes [provider]  cetirizine (ZYRTEC) 10 MG tablet Take 10 mg by mouth daily.   Yes [provider]  fluticasone (FLONASE) 50 MCG/ACT nasal spray Place into both nostrils daily.   Yes [provider]  montelukast (SINGULAIR) 10 MG tablet Take 10 mg by mouth at bedtime.   Yes [provider]  azithromycin (ZITHROMAX Z-PAK) 250 MG tablet 2 tab by mouth day 1, then 1 per day 06/07/17   Biagio Borg, MD  citalopram (CELEXA) 20 MG tablet  01/18/17   [provider]  EPINEPHrine (AUVI-Q) 0.3 mg/0.3 mL IJ SOAJ injection Inject 0.3 mLs (0.3 mg total) into the muscle once for 1 dose. 08/26/17 08/26/17  Korri Ask, Barbette Hair, MD  ibuprofen (ADVIL,MOTRIN) 200 MG tablet Take 400 mg by mouth every 6 (six) hours as needed for fever, headache, mild pain, moderate pain or cramping.    [provider]  IRON PO iron    [provider]  linaclotide (LINZESS) 72 MCG capsule Take 1 capsule (72 mcg total) by mouth daily before breakfast. 05/03/16   Nche, Charlene Brooke, NP  omeprazole (PRILOSEC) 20 MG capsule Take 1 capsule (20 mg total) by mouth daily. 05/03/16   Nche, Charlene Brooke, NP  polyethylene glycol powder (GLYCOLAX/MIRALAX) powder Take 17 g by mouth daily as needed. 05/03/16   Nche, Charlene Brooke, NP  predniSONE (DELTASONE) 20 MG tablet Take 2 tablets (40 mg total) by mouth daily. 08/26/17    Peggie Hornak, Barbette Hair, MD  SUMAtriptan (IMITREX) 50 MG tablet Take 1 tablet (50 mg total) by mouth every 8 (eight) hours as needed for migraine. 05/07/17   Nche, Charlene Brooke, NP    Family History Family History  Problem Relation Age of Onset  . Hypertension Mother   . Fibroids Mother   . Cancer Mother        ovarian  . Cancer Maternal Grandfather        cervical  . Thyroid disease Brother     Social History Social History   Tobacco Use  . Smoking status: Current Every Day Smoker    Packs/day: 0.25    Years: 14.00    Pack years: 3.50    Types: Cigarettes  . Smokeless tobacco: Never Used  Substance Use Topics  . Alcohol use: Yes    Comment:  socially  . Drug use: No     Allergies   Patient has no known allergies.   Review of Systems Review of Systems  Constitutional: Negative for fever.  Respiratory: Positive for shortness of breath.   Cardiovascular: Negative for chest pain.  Gastrointestinal: Positive for diarrhea, nausea and vomiting. Negative for abdominal pain.  Genitourinary: Negative for dysuria.  Skin: Positive for color change. Negative for rash.  Neurological: Positive for light-headedness.  All other systems reviewed and are negative.    Physical Exam Updated Vital Signs BP 106/68   Pulse 84   Resp 16   Ht 5' (1.524 m)   Wt 63.5 kg (140 lb)   LMP 08/25/2017 (Exact Date)   SpO2 99%   BMI 27.34 kg/m   Physical Exam  Constitutional: She is oriented to person, place, and time. She appears well-developed and well-nourished. No distress.  HENT:  Head: Normocephalic and atraumatic.  Mouth/Throat: Oropharynx is clear and moist.  Eyes: Pupils are equal, round, and reactive to light.  Cardiovascular: Normal rate, regular rhythm and normal heart sounds.  Pulmonary/Chest: Effort normal and breath sounds normal. No respiratory distress. She has no wheezes.  Abdominal: Soft. Bowel sounds are normal.  Neurological: She is alert and oriented to person,  place, and time.  Skin: Skin is warm and dry. No rash noted.  Psychiatric: She has a normal mood and affect.  Nursing note and vitals reviewed.    ED Treatments / Results  Labs (all labs ordered are listed, but only abnormal results are displayed) Labs Reviewed  TRYPTASE    EKG None  Radiology No results found.  Procedures Procedures (including critical care time)  Medications Ordered in ED Medications  famotidine (PEPCID) tablet 20  mg (20 mg Oral Given 08/26/17 0017)  predniSONE (DELTASONE) tablet 60 mg (60 mg Oral Given 08/26/17 0018)  diphenhydrAMINE (BENADRYL) capsule 25 mg (25 mg Oral Given 08/26/17 0018)     Initial Impression / Assessment and Plan / ED Course  I have reviewed the triage vital signs and the nursing notes.  Pertinent labs & imaging results that were available during my care of the patient were reviewed by me and considered in my medical decision making (see chart for details).  Clinical Course as of Aug 26 241  Mon Aug 26, 2017  0242 On recheck, patient resting comfortably.  No recurrence of symptoms.   [CH]    Clinical Course User Index [CH] Keyvin Rison, Barbette Hair, MD    Patient presents with reported allergic reaction.  Self administered epinephrine prior to arrival with improvement of symptoms.  History of similar symptoms in the past.  Unknown trigger.  She is nontoxic appearing.  Vital signs reassuring.  No signs or symptoms at this time of anaphylaxis.  She was given prednisone, Benadryl, and Pepcid.  She was observed for approximately 4 hours post epinephrine without any recurrence of symptoms.  Recommend close follow-up with allergist.  After history, exam, and medical workup I feel the patient has been appropriately medically screened and is safe for discharge home. Pertinent diagnoses were discussed with the patient. Patient was given return precautions.   Final Clinical Impressions(s) / ED Diagnoses   Final diagnoses:  Allergic reaction,  initial encounter    ED Discharge Orders        Ordered    predniSONE (DELTASONE) 20 MG tablet  Daily     08/26/17 0242    EPINEPHrine (AUVI-Q) 0.3 mg/0.3 mL IJ SOAJ injection   Once     08/26/17 0243       Merryl Hacker, MD 08/26/17 [phone]

## 2017-08-29 LAB — TRYPTASE: TRYPTASE: 7.2 ug/L (ref 2.2–13.2)

## 2017-09-09 ENCOUNTER — Ambulatory Visit: Payer: BLUE CROSS/BLUE SHIELD | Admitting: Psychology

## 2017-09-09 DIAGNOSIS — F411 Generalized anxiety disorder: Secondary | ICD-10-CM | POA: Diagnosis not present

## 2017-09-09 DIAGNOSIS — F321 Major depressive disorder, single episode, moderate: Secondary | ICD-10-CM

## 2017-09-23 ENCOUNTER — Ambulatory Visit: Payer: BLUE CROSS/BLUE SHIELD | Admitting: Psychology

## 2017-10-07 ENCOUNTER — Ambulatory Visit: Payer: BLUE CROSS/BLUE SHIELD | Admitting: Psychology

## 2017-10-07 DIAGNOSIS — F411 Generalized anxiety disorder: Secondary | ICD-10-CM

## 2017-10-07 DIAGNOSIS — F321 Major depressive disorder, single episode, moderate: Secondary | ICD-10-CM | POA: Diagnosis not present

## 2017-10-21 ENCOUNTER — Ambulatory Visit: Payer: BLUE CROSS/BLUE SHIELD | Admitting: Psychology

## 2017-10-31 ENCOUNTER — Ambulatory Visit (INDEPENDENT_AMBULATORY_CARE_PROVIDER_SITE_OTHER): Payer: BLUE CROSS/BLUE SHIELD | Admitting: Family Medicine

## 2017-10-31 ENCOUNTER — Encounter: Payer: Self-pay | Admitting: Family Medicine

## 2017-10-31 VITALS — BP 92/60 | HR 88 | Temp 98.5°F | Ht 60.0 in | Wt 138.0 lb

## 2017-10-31 DIAGNOSIS — R300 Dysuria: Secondary | ICD-10-CM

## 2017-10-31 LAB — POCT URINALYSIS DIPSTICK
BILIRUBIN UA: NEGATIVE
Glucose, UA: NEGATIVE
KETONES UA: NEGATIVE
Nitrite, UA: NEGATIVE
PROTEIN UA: POSITIVE — AB
Spec Grav, UA: >=1.03 — AB (ref 1.010–1.025)
Urobilinogen, UA: 0.2 E.U./dL
pH, UA: 6 (ref 5.0–8.0)

## 2017-10-31 MED ORDER — CEPHALEXIN 500 MG PO CAPS
500.0000 mg | ORAL_CAPSULE | Freq: Two times a day (BID) | ORAL | 0 refills | Status: DC
Start: 2017-10-31 — End: 2018-02-14

## 2017-10-31 NOTE — Progress Notes (Signed)
SUBJECTIVE: Ashley Cole is a 32 y.o. female who complains of urinary frequency, urgency and dysuria x 7 days, without flank pain, fever, chills, or abnormal vaginal discharge or bleeding.   Current Outpatient Medications on File Prior to Visit  Medication Sig Dispense Refill  . albuterol (PROVENTIL HFA;VENTOLIN HFA) 108 (90 Base) MCG/ACT inhaler Inhale 2 puffs into the lungs every 6 (six) hours as needed for wheezing or shortness of breath.    Marland Kitchen azelastine (OPTIVAR) 0.05 % ophthalmic solution 1 drop 2 (two) times daily as needed.    . cetirizine (ZYRTEC) 10 MG tablet Take 10 mg by mouth daily.    . fluticasone (FLONASE) 50 MCG/ACT nasal spray Place into both nostrils daily.    Marland Kitchen ibuprofen (ADVIL,MOTRIN) 200 MG tablet Take 400 mg by mouth every 6 (six) hours as needed for fever, headache, mild pain, moderate pain or cramping.    . IRON PO iron    . linaclotide (LINZESS) 72 MCG capsule Take 1 capsule (72 mcg total) by mouth daily before breakfast. 16 capsule 0  . montelukast (SINGULAIR) 10 MG tablet Take 10 mg by mouth at bedtime.    Marland Kitchen omeprazole (PRILOSEC) 20 MG capsule Take 1 capsule (20 mg total) by mouth daily. 30 capsule 3  . polyethylene glycol powder (GLYCOLAX/MIRALAX) powder Take 17 g by mouth daily as needed. 3350 g 1  . SUMAtriptan (IMITREX) 50 MG tablet Take 1 tablet (50 mg total) by mouth every 8 (eight) hours as needed for migraine. 10 tablet 0   No current facility-administered medications on file prior to visit.     No Known Allergies  Past Medical History:  Diagnosis Date  . Abnormal Pap smear   . Anemia   . Anxiety   . Chlamydia   . GERD (gastroesophageal reflux disease)   . Headache(784.0)   . Human papilloma virus    cells removed x2  . Spinal headache   . Trichomonas   . Urinary tract infection   . Vaginal Pap smear, abnormal     Past Surgical History:  Procedure Laterality Date  . DILATION AND CURETTAGE OF UTERUS    . GYNECOLOGIC CRYOSURGERY    .  PILONIDAL CYST EXCISION    . THERAPEUTIC ABORTION      Family History  Problem Relation Age of Onset  . Hypertension Mother   . Fibroids Mother   . Cancer Mother        ovarian  . Cancer Maternal Grandfather        cervical  . Thyroid disease Brother     Social History   Socioeconomic History  . Marital status: Married    Spouse name: Not on file  . Number of children: Not on file  . Years of education: Not on file  . Highest education level: Not on file  Occupational History  . Not on file  Social Needs  . Financial resource strain: Not on file  . Food insecurity:    Worry: Not on file    Inability: Not on file  . Transportation needs:    Medical: Not on file    Non-medical: Not on file  Tobacco Use  . Smoking status: Current Every Day Smoker    Packs/day: 0.25    Years: 14.00    Pack years: 3.50    Types: Cigarettes  . Smokeless tobacco: Never Used  Substance and Sexual Activity  . Alcohol use: Yes    Comment:  socially  . Drug use: No  .  Sexual activity: Yes    Birth control/protection: Pill    Comment: Last intercourse 3 days ago  Lifestyle  . Physical activity:    Days per week: Not on file    Minutes per session: Not on file  . Stress: Not on file  Relationships  . Social connections:    Talks on phone: Not on file    Gets together: Not on file    Attends religious service: Not on file    Active member of club or organization: Not on file    Attends meetings of clubs or organizations: Not on file    Relationship status: Not on file  . Intimate partner violence:    Fear of current or ex partner: Not on file    Emotionally abused: Not on file    Physically abused: Not on file    Forced sexual activity: Not on file  Other Topics Concern  . Not on file  Social History Narrative  . Not on file   The PMH, PSH, Social History, Family History, Medications, and allergies have been reviewed in [facility], and have been updated if relevant.  OBJECTIVE:  BP  92/60 (BP Location: Left Arm, Patient Position: Sitting, Cuff Size: Normal)   Pulse 88   Temp 98.5 F (36.9 C) (Oral)   Ht 5' (1.524 m)   Wt 138 lb (62.6 kg)   LMP 10/09/2017   SpO2 96%   BMI 26.95 kg/m   Appears well, in no apparent distress.  Vital signs are normal. The abdomen is soft without tenderness, guarding, mass, rebound or organomegaly. No CVA tenderness or inguinal adenopathy noted. Urine dipstick shows positive for protein and positive for leukocytes.    ASSESSMENT: UTI uncomplicated without evidence of pyelonephritis  PLAN: Treatment per orders - keflex 500 mg twice daily x 7 days, also push fluids, may use Pyridium OTC prn. Call or return to clinic prn if these symptoms worsen or fail to improve as anticipated.

## 2017-10-31 NOTE — Progress Notes (Signed)
Sent for C&S/thx dmf 

## 2017-10-31 NOTE — Patient Instructions (Signed)

## 2017-11-04 ENCOUNTER — Ambulatory Visit: Payer: BLUE CROSS/BLUE SHIELD | Admitting: Psychology

## 2017-11-18 ENCOUNTER — Ambulatory Visit: Payer: BLUE CROSS/BLUE SHIELD | Admitting: Psychology

## 2017-11-18 DIAGNOSIS — F411 Generalized anxiety disorder: Secondary | ICD-10-CM

## 2017-11-18 DIAGNOSIS — F321 Major depressive disorder, single episode, moderate: Secondary | ICD-10-CM

## 2017-12-02 ENCOUNTER — Ambulatory Visit: Payer: Self-pay | Admitting: Psychology

## 2017-12-16 ENCOUNTER — Ambulatory Visit: Payer: BLUE CROSS/BLUE SHIELD | Admitting: Psychology

## 2017-12-30 ENCOUNTER — Telehealth: Payer: Self-pay | Admitting: Obstetrics and Gynecology

## 2017-12-30 ENCOUNTER — Encounter: Payer: Self-pay | Admitting: Nurse Practitioner

## 2017-12-30 ENCOUNTER — Ambulatory Visit (INDEPENDENT_AMBULATORY_CARE_PROVIDER_SITE_OTHER): Payer: BLUE CROSS/BLUE SHIELD | Admitting: Nurse Practitioner

## 2017-12-30 ENCOUNTER — Ambulatory Visit: Payer: Self-pay

## 2017-12-30 ENCOUNTER — Ambulatory Visit (INDEPENDENT_AMBULATORY_CARE_PROVIDER_SITE_OTHER): Payer: BLUE CROSS/BLUE SHIELD | Admitting: Psychology

## 2017-12-30 VITALS — BP 118/68 | HR 59 | Temp 98.5°F | Ht 60.0 in | Wt 142.0 lb

## 2017-12-30 DIAGNOSIS — F321 Major depressive disorder, single episode, moderate: Secondary | ICD-10-CM | POA: Diagnosis not present

## 2017-12-30 DIAGNOSIS — F411 Generalized anxiety disorder: Secondary | ICD-10-CM

## 2017-12-30 DIAGNOSIS — R300 Dysuria: Secondary | ICD-10-CM | POA: Diagnosis not present

## 2017-12-30 DIAGNOSIS — F410 Panic disorder [episodic paroxysmal anxiety] without agoraphobia: Secondary | ICD-10-CM | POA: Diagnosis not present

## 2017-12-30 DIAGNOSIS — F064 Anxiety disorder due to known physiological condition: Secondary | ICD-10-CM

## 2017-12-30 DIAGNOSIS — R102 Pelvic and perineal pain: Secondary | ICD-10-CM | POA: Diagnosis not present

## 2017-12-30 DIAGNOSIS — G8929 Other chronic pain: Secondary | ICD-10-CM

## 2017-12-30 LAB — POCT URINALYSIS DIPSTICK
Bilirubin, UA: NEGATIVE
Glucose, UA: NEGATIVE
Ketones, UA: NEGATIVE
LEUKOCYTES UA: NEGATIVE
NITRITE UA: NEGATIVE
PROTEIN UA: NEGATIVE
SPEC GRAV UA: 1.015 (ref 1.010–1.025)
Urobilinogen, UA: 0.2 E.U./dL
pH, UA: 7.5 (ref 5.0–8.0)

## 2017-12-30 MED ORDER — DULOXETINE HCL 20 MG PO CPEP: 20.0000 mg | ORAL_CAPSULE | Freq: Every day | ORAL | 1 refills | Status: DC

## 2017-12-30 NOTE — Telephone Encounter (Signed)
Called and left a message for patient to call back to schedule a new patient doctor referral appointment with our office to see any provider for chronic pelvic pain.

## 2017-12-30 NOTE — Telephone Encounter (Signed)
Pt. Reports last night she woke up and felt short of breath - "like I might die. I was afraid to go to sleep." States she did eventually did fall asleep. States she had sharp "pelvic pain yesterday" as well - it lasted a couple of hours. States she has had sharp pelvic pain "for about 5 years." She is not on her period. Agent made pt. An appointment for today. Instructed if symptoms returned or she felt worse, to go to ED. Verbalizes understanding.  Reason for Disposition . [1] MILD difficulty breathing (e.g., minimal/no SOB at rest, SOB with walking, pulse <100) AND [2] NEW-onset or WORSE than normal  Answer Assessment - Initial Assessment Questions 1. RESPIRATORY STATUS: "Describe your breathing?" (e.g., wheezing, shortness of breath, unable to speak, severe coughing)      Felt short of breath during the night while laying down 2. ONSET: "When did this breathing problem begin?"      Last night 3. PATTERN "Does the difficult breathing come and go, or has it been constant since it started?"      Comes and goes 4. SEVERITY: "How bad is your breathing?" (e.g., mild, moderate, severe)    - MILD: No SOB at rest, mild SOB with walking, speaks normally in sentences, can lay down, no retractions, pulse < 100.    - MODERATE: SOB at rest, SOB with minimal exertion and prefers to sit, cannot lie down flat, speaks in phrases, mild retractions, audible wheezing, pulse 100-120.    - SEVERE: Very SOB at rest, speaks in single words, struggling to breathe, sitting hunched forward, retractions, pulse > 120      Mild 5. RECURRENT SYMPTOM: "Have you had difficulty breathing before?" If so, ask: "When was the last time?" and "What happened that time?"      No 6. CARDIAC HISTORY: "Do you have any history of heart disease?" (e.g., heart attack, angina, bypass surgery, angioplasty)      No 7. LUNG HISTORY: "Do you have any history of lung disease?"  (e.g., pulmonary embolus, asthma, emphysema)     Smoker 8. CAUSE:  "What do you think is causing the breathing problem?"      Unsure 9. OTHER SYMPTOMS: "Do you have any other symptoms? (e.g., dizziness, runny nose, cough, chest pain, fever)     No 10. PREGNANCY: "Is there any chance you are pregnant?" "When was your last menstrual period?"       No 11. TRAVEL: "Have you traveled out of the country in the last month?" (e.g., travel history, exposures)       No  Protocols used: BREATHING DIFFICULTY-A-AH

## 2017-12-30 NOTE — Patient Instructions (Addendum)
You will be contacted to schedule appt with female GYN.   Will review records from urologist prior to ordering additional diagnostics  Start cymbalta today  Sign medical release form to get records from [facility]  Duloxetine delayed-release capsules What is this medicine? DULOXETINE (doo LOX e teen) is used to treat depression, anxiety, and different types of chronic pain. This medicine may be used for other purposes; ask your health care provider or pharmacist if you have questions. COMMON BRAND NAME(S): Cymbalta, Irenka What should I tell my health care provider before I take this medicine? They need to know if you have any of these conditions: -bipolar disorder or a family history of bipolar disorder -glaucoma -kidney disease -liver disease -suicidal thoughts or a previous suicide attempt -taken medicines called MAOIs like Carbex, Eldepryl, Marplan, Nardil, and Parnate within 14 days -an unusual reaction to duloxetine, other medicines, foods, dyes, or preservatives -pregnant or trying to get pregnant -breast-feeding How should I use this medicine? Take this medicine by mouth with a glass of water. Follow the directions on the prescription label. Do not cut, crush or chew this medicine. You can take this medicine with or without food. Take your medicine at regular intervals. Do not take your medicine more often than directed. Do not stop taking this medicine suddenly except upon the advice of your doctor. Stopping this medicine too quickly may cause serious side effects or your condition may worsen. A special MedGuide will be given to you by the pharmacist with each prescription and refill. Be sure to read this information carefully each time. Talk to your pediatrician regarding the use of this medicine in children. While this drug may be prescribed for children as young as 3 years of age for selected conditions, precautions do apply. Overdosage: If you think you have taken too  much of this medicine contact a poison control center or emergency room at once. NOTE: This medicine is only for you. Do not share this medicine with others. What if I miss a dose? If you miss a dose, take it as soon as you can. If it is almost time for your next dose, take only that dose. Do not take double or extra doses. What may interact with this medicine? Do not take this medicine with any of the following medications: -desvenlafaxine -levomilnacipran -linezolid -MAOIs like Carbex, Eldepryl, Marplan, Nardil, and Parnate -methylene blue (injected into a vein) -milnacipran -thioridazine -venlafaxine This medicine may also interact with the following medications: -alcohol -amphetamines -aspirin and aspirin-like medicines -certain antibiotics like ciprofloxacin and enoxacin -certain medicines for blood pressure, heart disease, irregular heart beat -certain medicines for depression, anxiety, or psychotic disturbances -certain medicines for migraine headache like almotriptan, eletriptan, frovatriptan, naratriptan, rizatriptan, sumatriptan, zolmitriptan -certain medicines that treat or prevent blood clots like warfarin, enoxaparin, and dalteparin -cimetidine -fentanyl -lithium -NSAIDS, medicines for pain and inflammation, like ibuprofen or naproxen -phentermine -procarbazine -rasagiline -sibutramine -St. John's wort -theophylline -tramadol -tryptophan This list may not describe all possible interactions. Give your health care provider a list of all the medicines, herbs, non-prescription drugs, or dietary supplements you use. Also tell them if you smoke, drink alcohol, or use illegal drugs. Some items may interact with your medicine. What should I watch for while using this medicine? Tell your doctor if your symptoms do not get better or if they get worse. Visit your doctor or health care professional for regular checks on your progress. Because it may take several weeks to see the  full effects  of this medicine, it is important to continue your treatment as prescribed by your doctor. Patients and their families should watch out for new or worsening thoughts of suicide or depression. Also watch out for sudden changes in feelings such as feeling anxious, agitated, panicky, irritable, hostile, aggressive, impulsive, severely restless, overly excited and hyperactive, or not being able to sleep. If this happens, especially at the beginning of treatment or after a change in dose, call your health care professional. Dennis Bast may get drowsy or dizzy. Do not drive, use machinery, or do anything that needs mental alertness until you know how this medicine affects you. Do not stand or sit up quickly, especially if you are an older patient. This reduces the risk of dizzy or fainting spells. Alcohol may interfere with the effect of this medicine. Avoid alcoholic drinks. This medicine can cause an increase in blood pressure. This medicine can also cause a sudden drop in your blood pressure, which may make you feel faint and increase the chance of a fall. These effects are most common when you first start the medicine or when the dose is increased, or during use of other medicines that can cause a sudden drop in blood pressure. Check with your doctor for instructions on monitoring your blood pressure while taking this medicine. Your mouth may get dry. Chewing sugarless gum or sucking hard candy, and drinking plenty of water may help. Contact your doctor if the problem does not go away or is severe. What side effects may I notice from receiving this medicine? Side effects that you should report to your doctor or health care professional as soon as possible: -allergic reactions like skin rash, itching or hives, swelling of the face, lips, or tongue -anxious -breathing problems -confusion -changes in vision -chest pain -confusion -elevated mood, decreased need for sleep, racing thoughts, impulsive  behavior -eye pain -fast, irregular heartbeat -feeling faint or lightheaded, falls -feeling agitated, angry, or irritable -hallucination, loss of contact with reality -high blood pressure -loss of balance or coordination -palpitations -redness, blistering, peeling or loosening of the skin, including inside the mouth -restlessness, pacing, inability to keep still -seizures -stiff muscles -suicidal thoughts or other mood changes -trouble passing urine or change in the amount of urine -trouble sleeping -unusual bleeding or bruising -unusually weak or tired -vomiting -yellowing of the eyes or skin Side effects that usually do not require medical attention (report to your doctor or health care professional if they continue or are bothersome): -change in sex drive or performance -change in appetite or weight -constipation -dizziness -dry mouth -headache -increased sweating -nausea -tired This list may not describe all possible side effects. Call your doctor for medical advice about side effects. You may report side effects to FDA at 1-800-FDA-1088. Where should I keep my medicine? Keep out of the reach of children. Store at room temperature between 20 and 25 degrees C (68 to 77 degrees F). Throw away any unused medicine after the expiration date. NOTE: This sheet is a summary. It may not cover all possible information. If you have questions about this medicine, talk to your doctor, pharmacist, or health care provider.  2018 Elsevier/Gold Standard (2015-10-20 18:16:03)

## 2017-12-30 NOTE — Telephone Encounter (Signed)
Noted  

## 2017-12-30 NOTE — Progress Notes (Signed)
Subjective:  Patient ID: Ashley Cole, female    DOB: 09/03/1985  Age: 32 y.o. MRN: [id number]  CC: Shortness of Breath (patient stated she experincing SOB, "not breathing right, long and slow". this happened last night. anxiety?)  Abdominal Pain  This is a chronic problem. The current episode started more than 1 year ago. The onset quality is gradual. The problem occurs intermittently. The problem has been waxing and waning. The pain is located in the suprapubic region. The pain is severe. The quality of the pain is colicky and a sensation of fullness. The abdominal pain radiates to the pelvis and perineum. Associated symptoms include dysuria and hematuria. Pertinent negatives include no anorexia, arthralgias, belching, constipation, diarrhea, fever, flatus, frequency, headaches, hematochezia, melena, myalgias, nausea, vomiting or weight loss. Nothing aggravates the pain. The pain is relieved by being still. Treatments tried: ibuprofen 800mg . The treatment provided significant relief. Prior diagnostic workup includes ultrasound and CT scan. There is no history of gallstones, GERD, irritable bowel syndrome, pancreatitis or PUD.  Shortness of Breath  This is a new problem. The current episode started yesterday. Associated symptoms include abdominal pain. Pertinent negatives include no fever, headaches or vomiting. The symptoms are aggravated by emotional upset. The patient has no known risk factors for DVT/PE. She has tried rest for the symptoms. The treatment provided significant relief. Her past medical history is significant for allergies and asthma. There is no history of aspirin allergies, bronchiolitis, CAD, chronic lung disease, COPD, DVT, a heart failure, PE, pneumonia or a recent surgery.   Pelvic and ABD pain for 49yrs, Onset after birth of son.  Evaluated by urologist and GYN last year:  Hx of chronic hematuria Cystoscopy and CT ABD/pelvis by [facility]. Normal per patient Pelvic  and trans vaginal US done by GYN She is not pleased with current GYN. She will like another referral to female GYN   Anxiety: Ongoing psychotherapy with [employer] consultant health). Used celexa in past. Stopped due to increased somnolence. Depression screen [facility] 2/9 12/30/2017 02/26/2017 05/03/2016  Decreased Interest 2 2 0  Down, Depressed, Hopeless 2 2 0  PHQ - 2 Score 4 4 0  Altered sleeping 3 2 -  Tired, decreased energy 2 3 -  Change in appetite 3 1 -  Feeling bad or failure about yourself  2 0 -  Trouble concentrating 0 0 -  Moving slowly or fidgety/restless 1 0 -  Suicidal thoughts 0 0 -  PHQ-9 Score 15 10 -   GAD 7 : Generalized Anxiety Score 12/30/2017 02/26/2017  Nervous, Anxious, on Edge 2 0  Control/stop worrying 3 3  Worry too much - different things 3 3  Trouble relaxing 2 2  Restless 0 0  Easily annoyed or irritable 2 2  Afraid - awful might happen 2 1  Total GAD 7 Score 14 11    Reviewed past Medical, Social and Family history today.  Outpatient Medications Prior to Visit  Medication Sig Dispense Refill  . albuterol (PROVENTIL HFA;VENTOLIN HFA) 108 (90 Base) MCG/ACT inhaler Inhale 2 puffs into the lungs every 6 (six) hours as needed for wheezing or shortness of breath.    Marland Kitchen azelastine (OPTIVAR) 0.05 % ophthalmic solution 1 drop 2 (two) times daily as needed.    . cephALEXin (KEFLEX) 500 MG capsule Take 1 capsule (500 mg total) by mouth 2 (two) times daily. 14 capsule 0  . cetirizine (ZYRTEC) 10 MG tablet Take 10 mg by mouth daily.    Marland Kitchen  fluticasone (FLONASE) 50 MCG/ACT nasal spray Place into both nostrils daily.    Marland Kitchen ibuprofen (ADVIL,MOTRIN) 200 MG tablet Take 400 mg by mouth every 6 (six) hours as needed for fever, headache, mild pain, moderate pain or cramping.    . IRON PO iron    . linaclotide (LINZESS) 72 MCG capsule Take 1 capsule (72 mcg total) by mouth daily before breakfast. 16 capsule 0  . montelukast (SINGULAIR) 10 MG tablet Take 10 mg by mouth at  bedtime.    Marland Kitchen omeprazole (PRILOSEC) 20 MG capsule Take 1 capsule (20 mg total) by mouth daily. 30 capsule 3  . polyethylene glycol powder (GLYCOLAX/MIRALAX) powder Take 17 g by mouth daily as needed. 3350 g 1  . SUMAtriptan (IMITREX) 50 MG tablet Take 1 tablet (50 mg total) by mouth every 8 (eight) hours as needed for migraine. 10 tablet 0   No facility-administered medications prior to visit.    ROS See HPI  Objective:  BP 118/68   Pulse (!) 59   Temp 98.5 F (36.9 C) (Oral)   Ht 5' (1.524 m)   Wt 142 lb (64.4 kg)   LMP 12/12/2017 (Approximate)   SpO2 99%   BMI 27.73 kg/m   BP Readings from Last 3 Encounters:  12/30/17 118/68  10/31/17 92/60  08/26/17 106/68    Wt Readings from Last 3 Encounters:  12/30/17 142 lb (64.4 kg)  10/31/17 138 lb (62.6 kg)  08/25/17 140 lb (63.5 kg)    Physical Exam  Constitutional: She is oriented to person, place, and time. She does not appear ill. No distress.  Cardiovascular: Normal rate and normal heart sounds.  Pulmonary/Chest: Effort normal and breath sounds normal. She exhibits no tenderness.  Abdominal: Soft. Bowel sounds are normal. She exhibits no distension, no ascites and no mass. There is no splenomegaly or hepatomegaly. There is tenderness. There is no rebound and no guarding.  Neurological: She is alert and oriented to person, place, and time.  Psychiatric: Her mood appears anxious.  Vitals reviewed.   Lab Results  Component Value Date   WBC 7.9 02/26/2017   HGB 10.9 (L) 02/26/2017   HCT 33.0 (L) 02/26/2017   PLT 287.0 02/26/2017   GLUCOSE 79 02/26/2017   CHOL 143 05/03/2016   TRIG 83.0 05/03/2016   HDL 37.90 (L) 05/03/2016   LDLCALC 89 05/03/2016   ALT 12 02/26/2017   AST 13 02/26/2017   NA 138 02/26/2017   K 3.4 (L) 02/26/2017   CL 100 02/26/2017   CREATININE 0.77 02/26/2017   BUN 12 02/26/2017   CO2 33 (H) 02/26/2017   TSH 1.15 05/03/2016    Assessment & Plan:   Cambryn was seen today for shortness of  breath.  Diagnoses and all orders for this visit:  Anxiety disorder due to general medical condition with panic attack -     DULoxetine (CYMBALTA) 20 MG capsule; Take 1 capsule (20 mg total) by mouth daily.  Dysuria -     POCT urinalysis dipstick  Chronic pelvic pain in female -     Ambulatory referral to Gynecology   I am having Akaila L. Schnake start on DULoxetine. I am also having her maintain her ibuprofen, linaclotide, omeprazole, polyethylene glycol powder, IRON PO, SUMAtriptan, cetirizine, fluticasone, montelukast, albuterol, azelastine, and cephALEXin.  Meds ordered this encounter  Medications  . DULoxetine (CYMBALTA) 20 MG capsule    Sig: Take 1 capsule (20 mg total) by mouth daily.    Dispense:  30 capsule  Refill:  1    Order Specific Question:   Supervising Provider    Answer:   Lucille Passy [[id number]]   Follow-up: Return in about 1 month (around 01/27/2018) for anxiety.  Wilfred Lacy, NP

## 2017-12-31 NOTE — Telephone Encounter (Signed)
Called and left a message for patient to call back to schedule a new patient doctor referral appointment with our office to see any provider for chronic pelvic pain.

## 2018-01-20 ENCOUNTER — Ambulatory Visit: Payer: BLUE CROSS/BLUE SHIELD | Admitting: Obstetrics and Gynecology

## 2018-01-27 ENCOUNTER — Ambulatory Visit: Payer: BLUE CROSS/BLUE SHIELD | Admitting: Psychology

## 2018-01-27 ENCOUNTER — Ambulatory Visit: Payer: BLUE CROSS/BLUE SHIELD | Admitting: Nurse Practitioner

## 2018-01-31 ENCOUNTER — Ambulatory Visit: Payer: BLUE CROSS/BLUE SHIELD | Admitting: Obstetrics and Gynecology

## 2018-01-31 ENCOUNTER — Ambulatory Visit: Payer: BLUE CROSS/BLUE SHIELD | Admitting: Obstetrics & Gynecology

## 2018-02-01 ENCOUNTER — Other Ambulatory Visit: Payer: Self-pay | Admitting: Nurse Practitioner

## 2018-02-01 DIAGNOSIS — F410 Panic disorder [episodic paroxysmal anxiety] without agoraphobia: Principal | ICD-10-CM

## 2018-02-01 DIAGNOSIS — F064 Anxiety disorder due to known physiological condition: Secondary | ICD-10-CM

## 2018-02-10 ENCOUNTER — Ambulatory Visit: Payer: BLUE CROSS/BLUE SHIELD | Admitting: Psychology

## 2018-02-14 ENCOUNTER — Encounter: Payer: Self-pay | Admitting: Nurse Practitioner

## 2018-02-14 ENCOUNTER — Ambulatory Visit: Payer: BLUE CROSS/BLUE SHIELD | Admitting: Psychology

## 2018-02-14 ENCOUNTER — Other Ambulatory Visit (HOSPITAL_COMMUNITY)
Admission: RE | Admit: 2018-02-14 | Discharge: 2018-02-14 | Disposition: A | Discharge status: Home / Self Care | Payer: BLUE CROSS/BLUE SHIELD | Source: Ambulatory Visit | Attending: Nurse Practitioner | Admitting: Nurse Practitioner

## 2018-02-14 ENCOUNTER — Ambulatory Visit (INDEPENDENT_AMBULATORY_CARE_PROVIDER_SITE_OTHER): Payer: BLUE CROSS/BLUE SHIELD | Admitting: Nurse Practitioner

## 2018-02-14 VITALS — BP 106/70 | HR 75 | Temp 98.4°F | Ht 60.0 in | Wt 141.0 lb

## 2018-02-14 DIAGNOSIS — F064 Anxiety disorder due to known physiological condition: Secondary | ICD-10-CM | POA: Insufficient documentation

## 2018-02-14 DIAGNOSIS — F411 Generalized anxiety disorder: Secondary | ICD-10-CM

## 2018-02-14 DIAGNOSIS — R310 Gross hematuria: Secondary | ICD-10-CM | POA: Diagnosis not present

## 2018-02-14 DIAGNOSIS — Z7951 Long term (current) use of inhaled steroids: Secondary | ICD-10-CM | POA: Insufficient documentation

## 2018-02-14 DIAGNOSIS — F39 Unspecified mood [affective] disorder: Secondary | ICD-10-CM

## 2018-02-14 DIAGNOSIS — Z79899 Other long term (current) drug therapy: Secondary | ICD-10-CM | POA: Diagnosis not present

## 2018-02-14 DIAGNOSIS — F410 Panic disorder [episodic paroxysmal anxiety] without agoraphobia: Secondary | ICD-10-CM

## 2018-02-14 DIAGNOSIS — R300 Dysuria: Secondary | ICD-10-CM

## 2018-02-14 DIAGNOSIS — N3001 Acute cystitis with hematuria: Secondary | ICD-10-CM

## 2018-02-14 DIAGNOSIS — F329 Major depressive disorder, single episode, unspecified: Secondary | ICD-10-CM | POA: Diagnosis not present

## 2018-02-14 DIAGNOSIS — F321 Major depressive disorder, single episode, moderate: Secondary | ICD-10-CM | POA: Diagnosis not present

## 2018-02-14 LAB — POCT URINALYSIS DIPSTICK
BILIRUBIN UA: NEGATIVE
Glucose, UA: NEGATIVE
KETONES UA: NEGATIVE
Nitrite, UA: NEGATIVE
PH UA: 6 (ref 5.0–8.0)
Protein, UA: POSITIVE — AB
Spec Grav, UA: 1.02 (ref 1.010–1.025)
UROBILINOGEN UA: 0.2 U/dL

## 2018-02-14 MED ORDER — BUPROPION HCL ER (XL) 150 MG PO TB24: 150.0000 mg | ORAL_TABLET | Freq: Every day | ORAL | 3 refills | Status: DC

## 2018-02-14 MED ORDER — NITROFURANTOIN MONOHYD MACRO 100 MG PO CAPS: 100.0000 mg | ORAL_CAPSULE | Freq: Two times a day (BID) | ORAL | 0 refills | Status: DC

## 2018-02-14 NOTE — Progress Notes (Addendum)
Subjective:  Patient ID: Ashley Cole, female    DOB: Aug 17, 1985  Age: 32 y.o. MRN: [id number]  CC: Follow-up (1 mo fu/anxiety is up and down, going alot right now, not taking her med due to side effects. ) and Urinary Tract Infection (complaining of frequent urinate, discomfort,odor. 1 day. )   Dysuria   This is a recurrent problem. The current episode started yesterday. The problem occurs every urination. The problem has been unchanged. The quality of the pain is described as burning. There has been no fever. She is sexually active. There is no history of pyelonephritis. Associated symptoms include frequency and hematuria. Pertinent negatives include no chills, discharge, flank pain, hesitancy, nausea, possible pregnancy, sweats, urgency or vomiting. She has tried increased fluids for the symptoms. The treatment provided mild relief. Her past medical history is significant for recurrent UTIs and a urological procedure. There is no history of kidney stones.  evaluated by urologist last year due to recurrent UTI. cystoscopy performed last year per aptient: no acute finding.  Denies any douching, no condoms, no scented lubricant. LMP 02/03/2018.  Depression and Anxiety: Ongoing sessions with psychologist. Use of celexa in past: caused somnolence and fatigue. Used cymbalta for 85months, stopped drug last week, no change in symptoms yet She will like to try Wellbutrin even though it has possible side effect profile of headache.  Reviewed past Medical, Social and Family history today.  Outpatient Medications Prior to Visit  Medication Sig Dispense Refill  . albuterol (PROVENTIL HFA;VENTOLIN HFA) 108 (90 Base) MCG/ACT inhaler Inhale 2 puffs into the lungs every 6 (six) hours as needed for wheezing or shortness of breath.    Marland Kitchen azelastine (OPTIVAR) 0.05 % ophthalmic solution 1 drop 2 (two) times daily as needed.    . cetirizine (ZYRTEC) 10 MG tablet Take 10 mg by mouth daily.    . fluticasone  (FLONASE) 50 MCG/ACT nasal spray Place into both nostrils daily.    Marland Kitchen ibuprofen (ADVIL,MOTRIN) 200 MG tablet Take 400 mg by mouth every 6 (six) hours as needed for fever, headache, mild pain, moderate pain or cramping.    . IRON PO iron    . linaclotide (LINZESS) 72 MCG capsule Take 1 capsule (72 mcg total) by mouth daily before breakfast. 16 capsule 0  . montelukast (SINGULAIR) 10 MG tablet Take 10 mg by mouth at bedtime.    Marland Kitchen omeprazole (PRILOSEC) 20 MG capsule Take 1 capsule (20 mg total) by mouth daily. 30 capsule 3  . polyethylene glycol powder (GLYCOLAX/MIRALAX) powder Take 17 g by mouth daily as needed. 3350 g 1  . SUMAtriptan (IMITREX) 50 MG tablet Take 1 tablet (50 mg total) by mouth every 8 (eight) hours as needed for migraine. 10 tablet 0  . cephALEXin (KEFLEX) 500 MG capsule Take 1 capsule (500 mg total) by mouth 2 (two) times daily. (Patient not taking: Reported on 02/14/2018) 14 capsule 0  . DULoxetine (CYMBALTA) 20 MG capsule Take 1 capsule (20 mg total) by mouth daily. Needs office visit for additional refill (Patient not taking: Reported on 02/14/2018) 90 capsule 0   No facility-administered medications prior to visit.     ROS See HPI  Objective:  BP 106/70   Pulse 75   Temp 98.4 F (36.9 C) (Oral)   Ht 5' (1.524 m)   Wt 141 lb (64 kg)   SpO2 100%   BMI 27.54 kg/m   BP Readings from Last 3 Encounters:  02/14/18 106/70  12/30/17 118/68  10/31/17  92/60    Wt Readings from Last 3 Encounters:  02/14/18 141 lb (64 kg)  12/30/17 142 lb (64.4 kg)  10/31/17 138 lb (62.6 kg)    Physical Exam  Constitutional: She appears well-developed and well-nourished.  Cardiovascular: Normal rate.  Pulmonary/Chest: Effort normal.  Psychiatric: She has a normal mood and affect. Her speech is normal and behavior is normal. Thought content normal.  Vitals reviewed.  Lab Results  Component Value Date   WBC 7.9 02/26/2017   HGB 10.9 (L) 02/26/2017   HCT 33.0 (L) 02/26/2017    PLT 287.0 02/26/2017   GLUCOSE 79 02/26/2017   CHOL 143 05/03/2016   TRIG 83.0 05/03/2016   HDL 37.90 (L) 05/03/2016   LDLCALC 89 05/03/2016   ALT 12 02/26/2017   AST 13 02/26/2017   NA 138 02/26/2017   K 3.4 (L) 02/26/2017   CL 100 02/26/2017   CREATININE 0.77 02/26/2017   BUN 12 02/26/2017   CO2 33 (H) 02/26/2017   TSH 1.15 05/03/2016    Assessment & Plan:   Ashley Cole was seen today for follow-up and urinary tract infection.  Diagnoses and all orders for this visit:  Anxiety disorder due to general medical condition with panic attack -     buPROPion (WELLBUTRIN XL) 150 MG 24 hr tablet; Take 1 tablet (150 mg total) by mouth daily.  Mood disorder (HCC) -     buPROPion (WELLBUTRIN XL) 150 MG 24 hr tablet; Take 1 tablet (150 mg total) by mouth daily.  Acute cystitis with hematuria -     Urine Culture -     Urine cytology ancillary only -     Discontinue: nitrofurantoin, macrocrystal-monohydrate, (MACROBID) 100 MG capsule; Take 1 capsule (100 mg total) by mouth 2 (two) times daily for 3 days. -     POCT urinalysis dipstick -     nitrofurantoin, macrocrystal-monohydrate, (MACROBID) 100 MG capsule; Take 1 capsule (100 mg total) by mouth 2 (two) times daily for 3 days.  Gross hematuria -     Urine Culture -     Discontinue: nitrofurantoin, macrocrystal-monohydrate, (MACROBID) 100 MG capsule; Take 1 capsule (100 mg total) by mouth 2 (two) times daily for 3 days. -     nitrofurantoin, macrocrystal-monohydrate, (MACROBID) 100 MG capsule; Take 1 capsule (100 mg total) by mouth 2 (two) times daily for 3 days.   I have discontinued Ashley Cole's cephALEXin and DULoxetine. I am also having her start on buPROPion. Additionally, I am having her maintain her ibuprofen, linaclotide, omeprazole, polyethylene glycol powder, IRON PO, SUMAtriptan, cetirizine, fluticasone, montelukast, albuterol, azelastine, and nitrofurantoin (macrocrystal-monohydrate).  Meds ordered this encounter    Medications  . buPROPion (WELLBUTRIN XL) 150 MG 24 hr tablet    Sig: Take 1 tablet (150 mg total) by mouth daily.    Dispense:  30 tablet    Refill:  3    Order Specific Question:   Supervising Provider    Answer:   MATTHEWS, CODY [[id number]]  . DISCONTD: nitrofurantoin, macrocrystal-monohydrate, (MACROBID) 100 MG capsule    Sig: Take 1 capsule (100 mg total) by mouth 2 (two) times daily for 3 days.    Dispense:  6 capsule    Refill:  0    Order Specific Question:   Supervising Provider    Answer:   MATTHEWS, CODY [[id number]]  . nitrofurantoin, macrocrystal-monohydrate, (MACROBID) 100 MG capsule    Sig: Take 1 capsule (100 mg total) by mouth 2 (two) times daily for 3 days.  Dispense:  6 capsule    Refill:  0    Order Specific Question:   Supervising Provider    Answer:   Lucille Passy [[id number]]    Follow-up: Return in about 4 weeks (around 03/14/2018) for anxiety and depression.  Wilfred Lacy, NP

## 2018-02-14 NOTE — Patient Instructions (Addendum)
Make appt with urology to f/up on hematuria.  Positive E. Coli in urine. Sensitive to macrobid. Additional refill sent F/up with urology as discussed  Take medication with food.  Bupropion tablets (Depression/Mood Disorders) What is this medicine? BUPROPION (byoo PROE pee on) is used to treat depression. This medicine may be used for other purposes; ask your health care provider or pharmacist if you have questions. COMMON BRAND NAME(S): Wellbutrin What should I tell my health care provider before I take this medicine? They need to know if you have any of these conditions: -an eating disorder, such as anorexia or bulimia -bipolar disorder or psychosis -diabetes or high blood sugar, treated with medication -glaucoma -heart disease, previous heart attack, or irregular heart beat -head injury or brain tumor -high blood pressure -kidney or liver disease -seizures -suicidal thoughts or a previous suicide attempt -Tourette's syndrome -weight loss -an unusual or allergic reaction to bupropion, other medicines, foods, dyes, or preservatives -breast-feeding -pregnant or trying to become pregnant How should I use this medicine? Take this medicine by mouth with a glass of water. Follow the directions on the prescription label. You can take it with or without food. If it upsets your stomach, take it with food. Take your medicine at regular intervals. Do not take your medicine more often than directed. Do not stop taking this medicine suddenly except upon the advice of your doctor. Stopping this medicine too quickly may cause serious side effects or your condition may worsen. A special MedGuide will be given to you by the pharmacist with each prescription and refill. Be sure to read this information carefully each time. Talk to your pediatrician regarding the use of this medicine in children. Special care may be needed. Overdosage: If you think you have taken too much of this medicine contact a  poison control center or emergency room at once. NOTE: This medicine is only for you. Do not share this medicine with others. What if I miss a dose? If you miss a dose, take it as soon as you can. If it is less than four hours to your next dose, take only that dose and skip the missed dose. Do not take double or extra doses. What may interact with this medicine? Do not take this medicine with any of the following medications: -linezolid -MAOIs like Azilect, Carbex, Eldepryl, Marplan, Nardil, and Parnate -methylene blue (injected into a vein) -other medicines that contain bupropion like Zyban This medicine may also interact with the following medications: -alcohol -certain medicines for anxiety or sleep -certain medicines for blood pressure like metoprolol, propranolol -certain medicines for depression or psychotic disturbances -certain medicines for HIV or AIDS like efavirenz, lopinavir, nelfinavir, ritonavir -certain medicines for irregular heart beat like propafenone, flecainide -certain medicines for Parkinson's disease like amantadine, levodopa -certain medicines for seizures like carbamazepine, phenytoin, phenobarbital -cimetidine -clopidogrel -cyclophosphamide -digoxin -furazolidone -isoniazid -nicotine -orphenadrine -procarbazine -steroid medicines like prednisone or cortisone -stimulant medicines for attention disorders, weight loss, or to stay awake -tamoxifen -theophylline -thiotepa -ticlopidine -tramadol -warfarin This list may not describe all possible interactions. Give your health care provider a list of all the medicines, herbs, non-prescription drugs, or dietary supplements you use. Also tell them if you smoke, drink alcohol, or use illegal drugs. Some items may interact with your medicine. What should I watch for while using this medicine? Tell your doctor if your symptoms do not get better or if they get worse. Visit your doctor or health care professional for  regular checks on your  progress. Because it may take several weeks to see the full effects of this medicine, it is important to continue your treatment as prescribed by your doctor. Patients and their families should watch out for new or worsening thoughts of suicide or depression. Also watch out for sudden changes in feelings such as feeling anxious, agitated, panicky, irritable, hostile, aggressive, impulsive, severely restless, overly excited and hyperactive, or not being able to sleep. If this happens, especially at the beginning of treatment or after a change in dose, call your health care professional. Avoid alcoholic drinks while taking this medicine. Drinking excessive alcoholic beverages, using sleeping or anxiety medicines, or quickly stopping the use of these agents while taking this medicine may increase your risk for a seizure. Do not drive or use heavy machinery until you know how this medicine affects you. This medicine can impair your ability to perform these tasks. Do not take this medicine close to bedtime. It may prevent you from sleeping. Your mouth may get dry. Chewing sugarless gum or sucking hard candy, and drinking plenty of water may help. Contact your doctor if the problem does not go away or is severe. What side effects may I notice from receiving this medicine? Side effects that you should report to your doctor or health care professional as soon as possible: -allergic reactions like skin rash, itching or hives, swelling of the face, lips, or tongue -breathing problems -changes in vision -confusion -elevated mood, decreased need for sleep, racing thoughts, impulsive behavior -fast or irregular heartbeat -hallucinations, loss of contact with reality -increased blood pressure -redness, blistering, peeling or loosening of the skin, including inside the mouth -seizures -suicidal thoughts or other mood changes -unusually weak or tired -vomiting Side effects that usually do  not require medical attention (report to your doctor or health care professional if they continue or are bothersome): -constipation -headache -loss of appetite -nausea -tremors -weight loss This list may not describe all possible side effects. Call your doctor for medical advice about side effects. You may report side effects to FDA at 1-800-FDA-1088. Where should I keep my medicine? Keep out of the reach of children. Store at room temperature between 20 and 25 degrees C (68 and 77 degrees F), away from direct sunlight and moisture. Keep tightly closed. Throw away any unused medicine after the expiration date. NOTE: This sheet is a summary. It may not cover all possible information. If you have questions about this medicine, talk to your doctor, pharmacist, or health care provider.  2018 Elsevier/Gold Standard (2015-11-11 13:44:21)

## 2018-02-16 LAB — URINE CULTURE
MICRO NUMBER:: 91100246
SPECIMEN QUALITY:: ADEQUATE

## 2018-02-17 ENCOUNTER — Telehealth: Payer: Self-pay | Admitting: Nurse Practitioner

## 2018-02-17 DIAGNOSIS — N898 Other specified noninflammatory disorders of vagina: Secondary | ICD-10-CM

## 2018-02-17 LAB — URINE CYTOLOGY ANCILLARY ONLY
CHLAMYDIA, DNA PROBE: NEGATIVE
Neisseria Gonorrhea: NEGATIVE
Trichomonas: NEGATIVE

## 2018-02-17 MED ORDER — FLUCONAZOLE 150 MG PO TABS: 150.0000 mg | ORAL_TABLET | Freq: Once | ORAL | 0 refills | Status: AC

## 2018-02-17 MED ORDER — NITROFURANTOIN MONOHYD MACRO 100 MG PO CAPS: 100.0000 mg | ORAL_CAPSULE | Freq: Two times a day (BID) | ORAL | 0 refills | Status: AC

## 2018-02-17 NOTE — Telephone Encounter (Signed)
Copied from [place] [id number]. Topic: General - Other >> Feb 17, 2018 12:09 PM Lennox Solders wrote: Reason for CRM: pt saw nche on 02-14-18 and was prescribed abx. Pt is now having vaginal dryness and itching. The discharge looks like yeast. [facility] [address]

## 2018-02-17 NOTE — Addendum Note (Signed)
Addended by: Wilfred Lacy L on: 02/17/2018 08:22 AM   Modules accepted: Orders

## 2018-02-17 NOTE — Telephone Encounter (Signed)
Please advise, we still waiting for cytology result.

## 2018-02-17 NOTE — Telephone Encounter (Signed)
Left vm for the pt to call back.  

## 2018-02-18 NOTE — Telephone Encounter (Signed)
Pt is aware.  

## 2018-02-19 ENCOUNTER — Other Ambulatory Visit: Payer: Self-pay | Admitting: Nurse Practitioner

## 2018-02-19 DIAGNOSIS — N390 Urinary tract infection, site not specified: Secondary | ICD-10-CM

## 2018-02-19 DIAGNOSIS — R310 Gross hematuria: Secondary | ICD-10-CM

## 2018-02-19 LAB — URINE CYTOLOGY ANCILLARY ONLY
BACTERIAL VAGINITIS: NEGATIVE
CANDIDA VAGINITIS: NEGATIVE

## 2018-02-20 ENCOUNTER — Telehealth: Payer: Self-pay | Admitting: Nurse Practitioner

## 2018-02-20 ENCOUNTER — Ambulatory Visit: Payer: BLUE CROSS/BLUE SHIELD | Admitting: Nurse Practitioner

## 2018-02-20 NOTE — Telephone Encounter (Signed)
Left vm for the pt to call back, ok for triage to give her detail message.

## 2018-02-20 NOTE — Telephone Encounter (Signed)
Called number on file, spouse pick up (not on DPR) so will call the pt back and give this infor:   Referral with office notes, demographics, labs and insurance card faxed to [facility] fax # [phone] # [phone]. Per there office they will call patient to schedule appointment. Referral entered in RMS.    Copied from [place] [phone]. Topic: General - Other >> Feb 20, 2018 11:33 AM Leward Quan A wrote: Reason for CRM:  Patient called to say that when she saw Dr. Lorayne Marek last ( 02/14/18) she was told that she would receive a referral to a Urologist and she was seeking some information on how long it would take to get this referral and an appointment set up. She request a call back from the nurse to discuss this issue.

## 2018-02-21 NOTE — Telephone Encounter (Signed)
Left vm for the pt to call back.  

## 2018-02-24 ENCOUNTER — Ambulatory Visit: Payer: BLUE CROSS/BLUE SHIELD | Admitting: Psychology

## 2018-03-06 ENCOUNTER — Ambulatory Visit: Payer: BLUE CROSS/BLUE SHIELD | Admitting: Psychology

## 2018-03-10 ENCOUNTER — Ambulatory Visit: Payer: BLUE CROSS/BLUE SHIELD | Admitting: Psychology

## 2018-03-20 ENCOUNTER — Ambulatory Visit (INDEPENDENT_AMBULATORY_CARE_PROVIDER_SITE_OTHER): Payer: BLUE CROSS/BLUE SHIELD | Admitting: Psychology

## 2018-03-20 ENCOUNTER — Ambulatory Visit: Payer: BLUE CROSS/BLUE SHIELD | Admitting: Nurse Practitioner

## 2018-03-20 DIAGNOSIS — F411 Generalized anxiety disorder: Secondary | ICD-10-CM | POA: Diagnosis not present

## 2018-03-20 DIAGNOSIS — F321 Major depressive disorder, single episode, moderate: Secondary | ICD-10-CM

## 2018-04-10 ENCOUNTER — Ambulatory Visit: Payer: BLUE CROSS/BLUE SHIELD | Admitting: Psychology

## 2018-04-17 ENCOUNTER — Encounter: Payer: Self-pay | Admitting: Nurse Practitioner

## 2018-04-17 ENCOUNTER — Ambulatory Visit: Payer: Self-pay | Admitting: Nurse Practitioner

## 2018-04-17 ENCOUNTER — Ambulatory Visit (INDEPENDENT_AMBULATORY_CARE_PROVIDER_SITE_OTHER): Payer: BLUE CROSS/BLUE SHIELD | Admitting: Nurse Practitioner

## 2018-04-17 VITALS — BP 120/76 | HR 90 | Temp 98.0°F | Ht 60.0 in | Wt 143.0 lb

## 2018-04-17 DIAGNOSIS — M791 Myalgia, unspecified site: Secondary | ICD-10-CM | POA: Diagnosis not present

## 2018-04-17 DIAGNOSIS — D509 Iron deficiency anemia, unspecified: Secondary | ICD-10-CM

## 2018-04-17 DIAGNOSIS — R5383 Other fatigue: Secondary | ICD-10-CM | POA: Diagnosis not present

## 2018-04-17 DIAGNOSIS — J00 Acute nasopharyngitis [common cold]: Secondary | ICD-10-CM

## 2018-04-17 LAB — CBC
HCT: 34.5 % — ABNORMAL LOW (ref 36.0–46.0)
Hemoglobin: 11.5 g/dL — ABNORMAL LOW (ref 12.0–15.0)
MCHC: 33.3 g/dL (ref 30.0–36.0)
MCV: 88.3 fl (ref 78.0–100.0)
PLATELETS: 309 10*3/uL (ref 150.0–400.0)
RBC: 3.91 Mil/uL (ref 3.87–5.11)
RDW: 13.6 % (ref 11.5–15.5)
WBC: 8.3 10*3/uL (ref 4.0–10.5)

## 2018-04-17 LAB — COMPREHENSIVE METABOLIC PANEL
ALBUMIN: 4.7 g/dL (ref 3.5–5.2)
ALK PHOS: 49 U/L (ref 39–117)
ALT: 12 U/L (ref 0–35)
AST: 13 U/L (ref 0–37)
BUN: 9 mg/dL (ref 6–23)
CO2: 30 mEq/L (ref 19–32)
Calcium: 9.8 mg/dL (ref 8.4–10.5)
Chloride: 103 mEq/L (ref 96–112)
Creatinine, Ser: 0.76 mg/dL (ref 0.40–1.20)
GFR: 113.03 mL/min (ref >60.00)
Glucose, Bld: 95 mg/dL (ref 70–99)
POTASSIUM: 4.3 meq/L (ref 3.5–5.1)
Sodium: 139 mEq/L (ref 135–145)
TOTAL PROTEIN: 7.2 g/dL (ref 6.0–8.3)
Total Bilirubin: 0.3 mg/dL (ref 0.2–1.2)

## 2018-04-17 LAB — IBC PANEL
IRON: 48 ug/dL (ref 42–145)
Saturation Ratios: 14.3 % — ABNORMAL LOW (ref 20.0–50.0)
Transferrin: 240 mg/dL (ref 212.0–360.0)

## 2018-04-17 LAB — TSH: TSH: 1.13 u[IU]/mL (ref 0.35–4.50)

## 2018-04-17 LAB — POC INFLUENZA A&B (BINAX/QUICKVUE)
INFLUENZA A, POC: NEGATIVE
Influenza B, POC: NEGATIVE

## 2018-04-17 MED ORDER — CHLORPHEN-PE-ACETAMINOPHEN 4-10-325 MG PO TABS: 1.0000 | ORAL_TABLET | Freq: Two times a day (BID) | ORAL | 0 refills | Status: AC

## 2018-04-17 NOTE — Patient Instructions (Addendum)
URI Instructions: Flonase and Afrin use: apply 1spray of afrin in each nare, wait 28mins, then apply 2sprays of flonase in each nare. Use both nasal spray consecutively x 3days, then flonase only for at least 14days.  Encourage adequate oral hydration.  Use over-the-counter  "cold" medicines  such as "Tylenol cold" , "Advil cold",  "Mucinex" or" Mucinex D"  for cough and congestion.  Avoid decongestants if you have high blood pressure. Use" Delsym" or" Robitussin" cough syrup varietis for cough.  You can use plain "Tylenol" or "Advi"l for fever, chills and achyness.   "Common cold" symptoms are usually triggered by a virus.  The antibiotics are usually not necessary. On average, a" viral cold" illness would take 4-7 days to resolve. Please, make an appointment if you are not better or if you're worse.  Stable anemia with mild iron deficiency. Use prenatal vitamins 1 tab once a day OTC. Normal CMP and TSH. Pending vitamin D  Have recent PAP faxed to [place]

## 2018-04-17 NOTE — Progress Notes (Signed)
Subjective:  Patient ID: Ashley Cole, female    DOB: September 14, 1985  Age: 32 y.o. MRN: [id number]  CC: Sore Throat (pt is c/o of sore throat,congestions, green mucus, fatigue. going on 1 day. ) and Leg Pain (pt is c/o legs pain,going on 1 wk. on feet alot at work. going on 1 wk. )  Muscle Pain  This is a new problem. The current episode started in the past 7 days. The problem occurs constantly. The problem has been waxing and waning since onset. The pain occurs in the context of a recent illness. The pain is present in the left lower leg, left hip, right hip, right knee, left knee, right lower leg, right heel and left heel. The pain is medium. Exacerbated by: prolonged standing and walking. Associated symptoms include fatigue. Pertinent negatives include no joint swelling, rash, stiffness, sensory change, swollen glands or weakness. Past treatments include acetaminophen, OTC NSAID and rest. The treatment provided moderate relief. There is no swelling present. She has been behaving normally. There is no history of chronic back pain, rheumatic disease or sickle cell disease.  URI   This is a new problem. The current episode started yesterday. The problem has been unchanged. There has been no fever. Associated symptoms include congestion, coughing, ear pain, a plugged ear sensation, rhinorrhea, sinus pain and a sore throat. Pertinent negatives include no rash or swollen glands. She has tried nothing for the symptoms.   Stopped taking wellbutrin because she was concerned about possible side effects and the need to wean off medication after prolonged use. Maintains counseling sessions with psychology.  Reviewed past Medical, Social and Family history today.  Outpatient Medications Prior to Visit  Medication Sig Dispense Refill  . albuterol (PROVENTIL HFA;VENTOLIN HFA) 108 (90 Base) MCG/ACT inhaler Inhale 2 puffs into the lungs every 6 (six) hours as needed for wheezing or shortness of breath.    Marland Kitchen  azelastine (OPTIVAR) 0.05 % ophthalmic solution 1 drop 2 (two) times daily as needed.    . cetirizine (ZYRTEC) 10 MG tablet Take 10 mg by mouth daily.    . fluticasone (FLONASE) 50 MCG/ACT nasal spray Place into both nostrils daily.    Marland Kitchen ibuprofen (ADVIL,MOTRIN) 200 MG tablet Take 400 mg by mouth every 6 (six) hours as needed for fever, headache, mild pain, moderate pain or cramping.    . IRON PO iron    . montelukast (SINGULAIR) 10 MG tablet Take 10 mg by mouth at bedtime.    Marland Kitchen omeprazole (PRILOSEC) 20 MG capsule Take 1 capsule (20 mg total) by mouth daily. 30 capsule 3  . polyethylene glycol powder (GLYCOLAX/MIRALAX) powder Take 17 g by mouth daily as needed. 3350 g 1  . buPROPion (WELLBUTRIN XL) 150 MG 24 hr tablet Take 1 tablet (150 mg total) by mouth daily. 30 tablet 3  . linaclotide (LINZESS) 72 MCG capsule Take 1 capsule (72 mcg total) by mouth daily before breakfast. (Patient not taking: Reported on 04/17/2018) 16 capsule 0  . SUMAtriptan (IMITREX) 50 MG tablet Take 1 tablet (50 mg total) by mouth every 8 (eight) hours as needed for migraine. (Patient not taking: Reported on 04/17/2018) 10 tablet 0   No facility-administered medications prior to visit.     ROS See HPI  Objective:  BP 120/76   Pulse 90   Temp 98 F (36.7 C) (Oral)   Ht 5' (1.524 m)   Wt 143 lb (64.9 kg)   LMP 04/07/2018 (Within Days)   SpO2 99%  BMI 27.93 kg/m   BP Readings from Last 3 Encounters:  04/17/18 120/76  02/14/18 106/70  12/30/17 118/68    Wt Readings from Last 3 Encounters:  04/17/18 143 lb (64.9 kg)  02/14/18 141 lb (64 kg)  12/30/17 142 lb (64.4 kg)    Physical Exam  Constitutional: She is oriented to person, place, and time.  HENT:  Right Ear: Tympanic membrane, external ear and ear canal normal.  Left Ear: Tympanic membrane, external ear and ear canal normal.  Nose: Mucosal edema and rhinorrhea present. Right sinus exhibits maxillary sinus tenderness. Right sinus exhibits no  frontal sinus tenderness. Left sinus exhibits maxillary sinus tenderness. Left sinus exhibits no frontal sinus tenderness.  Mouth/Throat: Uvula is midline. No trismus in the jaw. Posterior oropharyngeal erythema present. No oropharyngeal exudate.  Eyes: No scleral icterus.  Neck: Normal range of motion. Neck supple.  Cardiovascular: Normal rate, normal heart sounds and intact distal pulses.  Pulmonary/Chest: Effort normal and breath sounds normal.  Musculoskeletal: She exhibits no edema.       Right hip: Normal.       Left hip: Normal.       Right knee: Normal.       Left knee: Normal.       Right ankle: Normal.       Left ankle: Normal.       Lumbar back: Normal.       Right upper leg: Normal.       Left upper leg: Normal.       Right lower leg: Normal.       Left lower leg: Normal.       Right foot: Normal.       Left foot: Normal.  Normal gait  Lymphadenopathy:    She has cervical adenopathy.  Neurological: She is alert and oriented to person, place, and time.  Skin: No rash noted.  Vitals reviewed.   Lab Results  Component Value Date   WBC 8.3 04/17/2018   HGB 11.5 (L) 04/17/2018   HCT 34.5 (L) 04/17/2018   PLT 309.0 04/17/2018   GLUCOSE 95 04/17/2018   CHOL 143 05/03/2016   TRIG 83.0 05/03/2016   HDL 37.90 (L) 05/03/2016   LDLCALC 89 05/03/2016   ALT 12 04/17/2018   AST 13 04/17/2018   NA 139 04/17/2018   K 4.3 04/17/2018   CL 103 04/17/2018   CREATININE 0.76 04/17/2018   BUN 9 04/17/2018   CO2 30 04/17/2018   TSH 1.13 04/17/2018    Assessment & Plan:   Lallie was seen today for sore throat and leg pain.  Diagnoses and all orders for this visit:  Acute nasopharyngitis -     POC Influenza A&B(BINAX/QUICKVUE) -     Chlorphen-PE-Acetaminophen 4-10-325 MG TABS; Take 1 tablet by mouth every 12 (twelve) hours for 3 days.  Muscle pain -     CBC -     TSH -     Vitamin D 1,25 dihydroxy -     IBC panel -     Comprehensive metabolic panel  Fatigue,  unspecified type -     CBC -     TSH -     Vitamin D 1,25 dihydroxy -     IBC panel -     Comprehensive metabolic panel  Iron deficiency anemia, unspecified iron deficiency anemia type -     CBC -     IBC panel   I have discontinued Shamiracle L. Looper's linaclotide, SUMAtriptan, and  buPROPion. I am also having her start on Chlorphen-PE-Acetaminophen. Additionally, I am having her maintain her ibuprofen, omeprazole, polyethylene glycol powder, IRON PO, cetirizine, fluticasone, montelukast, albuterol, and azelastine.  Meds ordered this encounter  Medications  . Chlorphen-PE-Acetaminophen 4-10-325 MG TABS    Sig: Take 1 tablet by mouth every 12 (twelve) hours for 3 days.    Dispense:  6 tablet    Refill:  0    Order Specific Question:   Supervising Provider    Answer:   Lucille Passy [[id number]]    Follow-up: Return if symptoms worsen or fail to improve.  Wilfred Lacy, NP

## 2018-04-19 ENCOUNTER — Encounter: Payer: Self-pay | Admitting: Nurse Practitioner

## 2018-04-21 LAB — VITAMIN D 1,25 DIHYDROXY
Vitamin D 1, 25 (OH)2 Total: 52 pg/mL (ref 18–72)
Vitamin D2 1, 25 (OH)2: <8 pg/mL
Vitamin D3 1, 25 (OH)2: 52 pg/mL

## 2018-04-24 ENCOUNTER — Ambulatory Visit: Payer: BLUE CROSS/BLUE SHIELD | Admitting: Psychology

## 2018-05-16 ENCOUNTER — Telehealth: Payer: Self-pay

## 2018-05-16 NOTE — Telephone Encounter (Signed)
Pt verbalize understand of test result.   

## 2018-05-16 NOTE — Telephone Encounter (Signed)
Copied from [place] [id number]. Topic: General - Other >> May 16, 2018  9:25 AM Ashley Cole wrote: Reason for CRM: pt called in for results. Notes says that they were left on vm from office, pt states that she doesn't have a vm with results.   Please call back to review.   CB:[phone]- pt says that it is okay to lvm .

## 2018-05-30 ENCOUNTER — Ambulatory Visit (INDEPENDENT_AMBULATORY_CARE_PROVIDER_SITE_OTHER): Payer: BLUE CROSS/BLUE SHIELD | Admitting: Psychology

## 2018-05-30 DIAGNOSIS — F321 Major depressive disorder, single episode, moderate: Secondary | ICD-10-CM

## 2018-05-30 DIAGNOSIS — F411 Generalized anxiety disorder: Secondary | ICD-10-CM

## 2018-06-20 ENCOUNTER — Encounter (HOSPITAL_BASED_OUTPATIENT_CLINIC_OR_DEPARTMENT_OTHER): Payer: Self-pay | Admitting: Emergency Medicine

## 2018-06-20 ENCOUNTER — Other Ambulatory Visit: Payer: Self-pay

## 2018-06-20 ENCOUNTER — Emergency Department (HOSPITAL_BASED_OUTPATIENT_CLINIC_OR_DEPARTMENT_OTHER)
Admission: EM | Admit: 2018-06-20 | Discharge: 2018-06-20 | Disposition: A | Discharge status: Home / Self Care | Payer: BLUE CROSS/BLUE SHIELD | Attending: Emergency Medicine | Admitting: Emergency Medicine

## 2018-06-20 DIAGNOSIS — Z79899 Other long term (current) drug therapy: Secondary | ICD-10-CM | POA: Insufficient documentation

## 2018-06-20 DIAGNOSIS — F17210 Nicotine dependence, cigarettes, uncomplicated: Secondary | ICD-10-CM | POA: Diagnosis not present

## 2018-06-20 DIAGNOSIS — K0889 Other specified disorders of teeth and supporting structures: Secondary | ICD-10-CM | POA: Diagnosis present

## 2018-06-20 DIAGNOSIS — K047 Periapical abscess without sinus: Secondary | ICD-10-CM | POA: Diagnosis not present

## 2018-06-20 MED ORDER — AMOXICILLIN-POT CLAVULANATE 875-125 MG PO TABS: 1.0000 | ORAL_TABLET | Freq: Two times a day (BID) | ORAL | 0 refills | Status: AC

## 2018-06-20 MED ORDER — AMOXICILLIN-POT CLAVULANATE 875-125 MG PO TABS
1.0000 | ORAL_TABLET | Freq: Once | ORAL | Status: AC
  Administered 2018-06-20: 1 via ORAL
  Filled 2018-06-20: qty 1

## 2018-06-20 NOTE — ED Provider Notes (Signed)
[facility] EMERGENCY DEPARTMENT Provider Note  CSN: [id number] Arrival date & time: 06/20/18 0022  Chief Complaint(s) Dental Pain  HPI Ashley Cole is a 33 y.o. female who presents to the emergency department with 3 days of right upper molar pain and swelling that has gradually worsened since onset.  Pain is throbbing in nature.  Exacerbated with palpation of the peridental swelling.  Pain radiates up the side of the head.  Also complains of right-sided submandibular pain.  She reports that just prior to arrival she felt the lump pop.  Since this occurred, she reports that her pain has significantly improved.  She reports prior history of dental abscesses but not in this region.  She denies any fevers or chills.  Denies any difficulty swallowing or speaking.  No shortness of breath.  Denies any other physical complaints.  HPI  Past Medical History Past Medical History:  Diagnosis Date  . Abnormal Pap smear   . Anemia   . Anxiety   . Chlamydia   . GERD (gastroesophageal reflux disease)   . Headache(784.0)   . Human papilloma virus    cells removed x2  . Spinal headache   . Trichomonas   . Urinary tract infection   . Vaginal Pap smear, abnormal    Patient Active Problem List   Diagnosis Date Noted  . Acute sinus infection 06/07/2017  . Anxious mood as adjustment reaction 05/07/2017  . Family history of cervical cancer 05/03/2016  . Family history of ovarian cancer 05/03/2016  . Anemia 05/03/2016  . GASTROESOPHAGEAL REFLUX DISEASE 07/09/2007  . ABDOMINAL PAIN 07/09/2007  . EPIGASTRIC PAIN 07/09/2007   Home Medication(s) Prior to Admission medications   Medication Sig Start Date End Date Taking? Authorizing Provider  albuterol (PROVENTIL HFA;VENTOLIN HFA) 108 (90 Base) MCG/ACT inhaler Inhale 2 puffs into the lungs every 6 (six) hours as needed for wheezing or shortness of breath.    [provider]  amoxicillin-clavulanate (AUGMENTIN) 875-125 MG tablet  Take 1 tablet by mouth every 12 (twelve) hours for 10 days. 06/20/18 06/30/18  Fatima Blank, MD  azelastine (OPTIVAR) 0.05 % ophthalmic solution 1 drop 2 (two) times daily as needed.    [provider]  cetirizine (ZYRTEC) 10 MG tablet Take 10 mg by mouth daily.    [provider]  fluticasone (FLONASE) 50 MCG/ACT nasal spray Place into both nostrils daily.    [provider]  ibuprofen (ADVIL,MOTRIN) 200 MG tablet Take 400 mg by mouth every 6 (six) hours as needed for fever, headache, mild pain, moderate pain or cramping.    [provider]  IRON PO iron    [provider]  montelukast (SINGULAIR) 10 MG tablet Take 10 mg by mouth at bedtime.    [provider]  omeprazole (PRILOSEC) 20 MG capsule Take 1 capsule (20 mg total) by mouth daily. 05/03/16   Nche, Charlene Brooke, NP  polyethylene glycol powder (GLYCOLAX/MIRALAX) powder Take 17 g by mouth daily as needed. 05/03/16   Nche, Charlene Brooke, NP  Past Surgical History Past Surgical History:  Procedure Laterality Date  . DILATION AND CURETTAGE OF UTERUS    . GYNECOLOGIC CRYOSURGERY    . PILONIDAL CYST EXCISION    . THERAPEUTIC ABORTION     Family History Family History  Problem Relation Age of Onset  . Hypertension Mother   . Fibroids Mother   . Cancer Mother        ovarian  . Cancer Maternal Grandfather        cervical  . Thyroid disease Brother     Social History Social History   Tobacco Use  . Smoking status: Current Every Day Smoker    Packs/day: 0.25    Years: 14.00    Pack years: 3.50    Types: Cigarettes  . Smokeless tobacco: Never Used  Substance Use Topics  . Alcohol use: Yes    Comment:  socially  . Drug use: No   Allergies Patient has no known allergies.  Review of Systems Review of Systems All other systems are  reviewed and are negative for acute change except as noted in the HPI  Physical Exam Vital Signs  I have reviewed the triage vital signs BP 116/77 (BP Location: Right Arm)   Pulse 88   Temp 98.5 F (36.9 C) (Oral)   Resp 19   Ht 5' 0.05" (1.525 m)   Wt 65.8 kg   SpO2 100%   BMI 28.27 kg/m   Physical Exam Vitals signs reviewed.  Constitutional:      General: She is not in acute distress.    Appearance: She is well-developed. She is not diaphoretic.  HENT:     Head: Normocephalic and atraumatic.     Right Ear: External ear normal.     Left Ear: External ear normal.     Nose: Nose normal.     Mouth/Throat:     Mouth: Mucous membranes are moist.     Dentition: Dental abscesses present.     Pharynx: No pharyngeal swelling, oropharyngeal exudate, posterior oropharyngeal erythema or uvula swelling.     Tonsils: No tonsillar exudate or tonsillar abscesses.   Eyes:     General: No scleral icterus.    Conjunctiva/sclera: Conjunctivae normal.  Neck:     Musculoskeletal: Normal range of motion.     Trachea: Phonation normal.  Cardiovascular:     Rate and Rhythm: Normal rate and regular rhythm.  Pulmonary:     Effort: Pulmonary effort is normal. No respiratory distress.     Breath sounds: No stridor.  Abdominal:     General: There is no distension.  Musculoskeletal: Normal range of motion.  Neurological:     Mental Status: She is alert and oriented to person, place, and time.  Psychiatric:        Behavior: Behavior normal.     ED Results and Treatments Labs (all labs ordered are listed, but only abnormal results are displayed) Labs Reviewed - No data to display  EKG  EKG Interpretation  Date/Time:    Ventricular Rate:    PR Interval:    QRS Duration:   QT Interval:    QTC Calculation:   R Axis:     Text Interpretation:        Radiology No  results found. Pertinent labs & imaging results that were available during my care of the patient were reviewed by me and considered in my medical decision making (see chart for details).  Medications Ordered in ED Medications  amoxicillin-clavulanate (AUGMENTIN) 875-125 MG per tablet 1 tablet (1 tablet Oral Given 06/20/18 0053)                                                                                                                                    Procedures Procedures  (including critical care time)  Medical Decision Making / ED Course I have reviewed the nursing notes for this encounter and the patient's prior records (if available in EHR or on provided paperwork).    Right upper peri-molar abscess that is partially drained.  Patient declined incision and drainage at this time and requesting oral antibiotics.  Given first dose of Augmentin in the emergency department.  The patient appears reasonably screened and/or stabilized for discharge and I doubt any other medical condition or other [facility] requiring further screening, evaluation, or treatment in the ED at this time prior to discharge.  The patient is safe for discharge with strict return precautions.   Final Clinical Impression(s) / ED Diagnoses Final diagnoses:  Dental abscess    Disposition: Discharge  Condition: Good  I have discussed the results, Dx and Tx plan with the patient who expressed understanding and agree(s) with the plan. Discharge instructions discussed at great length. The patient was given strict return precautions who verbalized understanding of the instructions. No further questions at time of discharge.    ED Discharge Orders         Ordered    amoxicillin-clavulanate (AUGMENTIN) 875-125 MG tablet  Every 12 hours     06/20/18 0047           Follow Up: Dentist        This chart was dictated using voice recognition software.  Despite best efforts to proofread,  errors can occur  which can change the documentation meaning.   Fatima Blank, MD 06/20/18 0120

## 2018-06-20 NOTE — ED Triage Notes (Signed)
Pt c/o 7/10 right upper dental pain with headache and swollen gum.

## 2018-06-24 ENCOUNTER — Telehealth: Payer: Self-pay | Admitting: Nurse Practitioner

## 2018-06-24 MED ORDER — FLUCONAZOLE 150 MG PO TABS: 150.0000 mg | ORAL_TABLET | Freq: Once | ORAL | 0 refills | Status: AC

## 2018-06-24 NOTE — Telephone Encounter (Signed)
Ok to send diflucan 150mg  1tab once a day, #1

## 2018-06-24 NOTE — Telephone Encounter (Signed)
Rx sent for diflucan. Pt is aware.

## 2018-06-24 NOTE — Telephone Encounter (Signed)
Pt went to ED for Right upper peri-molar abscess that is partially drained on 06/20/2018 and Augmentin prescribed at the ED.   [place] please advise.    Copied from [place]. Topic: General - Other >> Jun 24, 2018 12:05 PM Windy Kalata wrote: Reason for CRM: Patient believes she has a yeast infection from taking antibiotics and would like to know if something can be called in for her, advised her she would need an appt.  Please advise  Best call back is [phone]

## 2018-07-16 ENCOUNTER — Encounter: Payer: Self-pay | Admitting: Nurse Practitioner

## 2018-07-16 ENCOUNTER — Other Ambulatory Visit (HOSPITAL_COMMUNITY)
Admission: RE | Admit: 2018-07-16 | Discharge: 2018-07-16 | Disposition: A | Discharge status: Home / Self Care | Payer: BLUE CROSS/BLUE SHIELD | Source: Ambulatory Visit | Attending: Nurse Practitioner | Admitting: Nurse Practitioner

## 2018-07-16 ENCOUNTER — Ambulatory Visit (INDEPENDENT_AMBULATORY_CARE_PROVIDER_SITE_OTHER): Payer: BLUE CROSS/BLUE SHIELD | Admitting: Nurse Practitioner

## 2018-07-16 VITALS — BP 120/74 | HR 100 | Temp 98.7°F | Ht 60.0 in | Wt 143.0 lb

## 2018-07-16 DIAGNOSIS — B9689 Other specified bacterial agents as the cause of diseases classified elsewhere: Secondary | ICD-10-CM

## 2018-07-16 DIAGNOSIS — N760 Acute vaginitis: Secondary | ICD-10-CM

## 2018-07-16 DIAGNOSIS — B3731 Acute candidiasis of vulva and vagina: Secondary | ICD-10-CM

## 2018-07-16 DIAGNOSIS — Z113 Encounter for screening for infections with a predominantly sexual mode of transmission: Secondary | ICD-10-CM | POA: Diagnosis present

## 2018-07-16 DIAGNOSIS — B373 Candidiasis of vulva and vagina: Secondary | ICD-10-CM

## 2018-07-16 DIAGNOSIS — N898 Other specified noninflammatory disorders of vagina: Secondary | ICD-10-CM | POA: Diagnosis present

## 2018-07-16 MED ORDER — FLUCONAZOLE 150 MG PO TABS: 150.0000 mg | ORAL_TABLET | Freq: Every day | ORAL | 0 refills | Status: DC

## 2018-07-16 MED ORDER — TERCONAZOLE 0.4 % VA CREA: 1.0000 | TOPICAL_CREAM | Freq: Every day | VAGINAL | 0 refills | Status: DC

## 2018-07-16 NOTE — Progress Notes (Addendum)
Subjective:  Patient ID: Ashley Cole, female    DOB: [birth year]  Age: 33 y.o. MRN: [id number]  CC: Vaginal Itching (pt is complaining of itchy,discharge and irritattion/ )  Vaginal Itching  The patient's primary symptoms include genital itching and vaginal discharge. The patient's pertinent negatives include no genital odor, genital rash, pelvic pain or vaginal bleeding. This is a recurrent problem. The problem occurs constantly. The problem has been gradually worsening. The problem affects both sides. She is not pregnant. Pertinent negatives include no abdominal pain, chills, dysuria, flank pain, frequency, joint pain, nausea, painful intercourse, rash or urgency. The vaginal discharge was white, thick and mucopurulent. The vaginal bleeding is typical of menses. She has not been passing clots. She has not been passing tissue. She has tried antifungals for the symptoms. The treatment provided no relief. She is sexually active. No, her partner does not have an STD. She uses nothing (husband is sterile) for contraception. Her menstrual history has been regular. Her past medical history is significant for vaginosis. There is no history of PID or an STD.   Reviewed past Medical, Social and Family history today.  Outpatient Medications Prior to Visit  Medication Sig Dispense Refill  . ibuprofen (ADVIL,MOTRIN) 200 MG tablet Take 400 mg by mouth every 6 (six) hours as needed for fever, headache, mild pain, moderate pain or cramping.    . IRON PO iron    . albuterol (PROVENTIL HFA;VENTOLIN HFA) 108 (90 Base) MCG/ACT inhaler Inhale 2 puffs into the lungs every 6 (six) hours as needed for wheezing or shortness of breath.    Marland Kitchen azelastine (OPTIVAR) 0.05 % ophthalmic solution 1 drop 2 (two) times daily as needed.    . cetirizine (ZYRTEC) 10 MG tablet Take 10 mg by mouth daily.    . fluticasone (FLONASE) 50 MCG/ACT nasal spray Place into both nostrils daily.    . montelukast (SINGULAIR) 10 MG tablet Take  10 mg by mouth at bedtime.    Marland Kitchen omeprazole (PRILOSEC) 20 MG capsule Take 1 capsule (20 mg total) by mouth daily. (Patient not taking: Reported on 07/16/2018) 30 capsule 3  . polyethylene glycol powder (GLYCOLAX/MIRALAX) powder Take 17 g by mouth daily as needed. (Patient not taking: Reported on 07/16/2018) 3350 g 1   No facility-administered medications prior to visit.     ROS See HPI  Objective:  BP 120/74   Pulse 100   Temp 98.7 F (37.1 C) (Oral)   Ht 5' (1.524 m)   Wt 143 lb (64.9 kg)   LMP 06/23/2018   SpO2 99%   BMI 27.93 kg/m   BP Readings from Last 3 Encounters:  07/16/18 120/74  06/20/18 116/77  04/17/18 120/76    Wt Readings from Last 3 Encounters:  07/16/18 143 lb (64.9 kg)  06/20/18 145 lb (65.8 kg)  04/17/18 143 lb (64.9 kg)    Physical Exam Exam conducted with a chaperone present.  HENT:     Mouth/Throat:     Mouth: Mucous membranes are moist.     Pharynx: No oropharyngeal exudate or posterior oropharyngeal erythema.  Neck:     Musculoskeletal: Normal range of motion and neck supple.  Abdominal:     Palpations: Abdomen is soft.     Tenderness: There is no abdominal tenderness.  Genitourinary:    Vagina: Vaginal discharge and erythema present.     Cervix: Discharge, friability and erythema present. No cervical motion tenderness or cervical bleeding.     Adnexa: Right adnexa normal and  left adnexa normal.  Lymphadenopathy:     Cervical: No cervical adenopathy.     Lower Body: No right inguinal adenopathy. No left inguinal adenopathy.     Lab Results  Component Value Date   WBC 8.3 04/17/2018   HGB 11.5 (L) 04/17/2018   HCT 34.5 (L) 04/17/2018   PLT 309.0 04/17/2018   GLUCOSE 95 04/17/2018   CHOL 143 05/03/2016   TRIG 83.0 05/03/2016   HDL 37.90 (L) 05/03/2016   LDLCALC 89 05/03/2016   ALT 12 04/17/2018   AST 13 04/17/2018   NA 139 04/17/2018   K 4.3 04/17/2018   CL 103 04/17/2018   CREATININE 0.76 04/17/2018   BUN 9 04/17/2018   CO2  30 04/17/2018   TSH 1.13 04/17/2018    Assessment & Plan:   Ashley Cole was seen today for vaginal itching.  Diagnoses and all orders for this visit:  BV (bacterial vaginosis) -     Cervicovaginal ancillary only( [place]) -     Discontinue: terconazole (TERAZOL 7) 0.4 % vaginal cream; Place 1 applicator vaginally at bedtime. -     fluconazole (DIFLUCAN) 150 MG tablet; Take 1 tablet (150 mg total) by mouth daily. Take second tab 3days apart from first tab -     Discontinue: metroNIDAZOLE (METROGEL VAGINAL) 0.75 % vaginal gel; Place 1 Applicatorful vaginally 2 (two) times daily. -     metroNIDAZOLE (METROGEL VAGINAL) 0.75 % vaginal gel; Place 1 Applicatorful vaginally at bedtime.  Screening examination for STD (sexually transmitted disease) -     Cervicovaginal ancillary only( [place]) -     GC/CT Probe, Amp (Throat)  Vaginal candida   I have discontinued Ashley Cole's omeprazole, polyethylene glycol powder, and terconazole. I have also changed her metroNIDAZOLE. Additionally, I am having her start on fluconazole. Lastly, I am having her maintain her ibuprofen, IRON PO, cetirizine, fluticasone, montelukast, albuterol, and azelastine.  Meds ordered this encounter  Medications  . DISCONTD: terconazole (TERAZOL 7) 0.4 % vaginal cream    Sig: Place 1 applicator vaginally at bedtime.    Dispense:  45 g    Refill:  0    Order Specific Question:   Supervising Provider    Answer:   Lucille Passy [[id number]]  . fluconazole (DIFLUCAN) 150 MG tablet    Sig: Take 1 tablet (150 mg total) by mouth daily. Take second tab 3days apart from first tab    Dispense:  2 tablet    Refill:  0    Order Specific Question:   Supervising Provider    Answer:   Lucille Passy [[id number]]  . DISCONTD: metroNIDAZOLE (METROGEL VAGINAL) 0.75 % vaginal gel    Sig: Place 1 Applicatorful vaginally 2 (two) times daily.    Dispense:  70 g    Refill:  0    Order Specific Question:   Supervising Provider    Answer:    MATTHEWS, CODY [[id number]]  . metroNIDAZOLE (METROGEL VAGINAL) 0.75 % vaginal gel    Sig: Place 1 Applicatorful vaginally at bedtime.    Dispense:  70 g    Refill:  0    Order Specific Question:   Supervising Provider    Answer:   MATTHEWS, CODY [[id number]]    Problem List Items Addressed This Visit    None    Visit Diagnoses    BV (bacterial vaginosis)    -  Primary   Relevant Medications   fluconazole (DIFLUCAN) 150 MG tablet   metroNIDAZOLE (METROGEL VAGINAL)  0.75 % vaginal gel   Other Relevant Orders   Cervicovaginal ancillary only( [place]) (Completed)   Screening examination for STD (sexually transmitted disease)       Relevant Orders   Cervicovaginal ancillary only( [place]) (Completed)   GC/CT Probe, Amp (Throat)   Vaginal candida       Relevant Medications   fluconazole (DIFLUCAN) 150 MG tablet       Follow-up: No follow-ups on file.  Wilfred Lacy, NP

## 2018-07-16 NOTE — Patient Instructions (Signed)
You will be contacted with lab results

## 2018-07-18 ENCOUNTER — Encounter: Payer: Self-pay | Admitting: Nurse Practitioner

## 2018-07-18 ENCOUNTER — Ambulatory Visit: Payer: Self-pay | Admitting: Nurse Practitioner

## 2018-07-18 LAB — CERVICOVAGINAL ANCILLARY ONLY
BACTERIAL VAGINITIS: POSITIVE — AB
Candida vaginitis: POSITIVE — AB
Chlamydia: NEGATIVE
NEISSERIA GONORRHEA: NEGATIVE
TRICH (WINDOWPATH): NEGATIVE

## 2018-07-18 MED ORDER — METRONIDAZOLE 0.75 % VA GEL: 1.0000 | Freq: Two times a day (BID) | VAGINAL | 0 refills | Status: DC

## 2018-07-18 MED ORDER — METRONIDAZOLE 0.75 % VA GEL: 1.0000 | Freq: Every day | VAGINAL | 0 refills | Status: DC

## 2018-07-18 NOTE — Addendum Note (Signed)
Addended by: Wilfred Lacy L on: 07/18/2018 05:06 PM   Modules accepted: Orders

## 2018-07-19 LAB — GC/CHLAMYDIA PROBE, AMP (THROAT)
Chlamydia trachomatis RNA: NOT DETECTED
NEISSERIA GONORRHOEAE RNA (THROAT): NOT DETECTED

## 2018-07-25 ENCOUNTER — Ambulatory Visit: Payer: Self-pay | Admitting: Nurse Practitioner

## 2018-08-05 ENCOUNTER — Ambulatory Visit: Payer: BLUE CROSS/BLUE SHIELD | Admitting: Psychology

## 2018-08-05 DIAGNOSIS — F411 Generalized anxiety disorder: Secondary | ICD-10-CM | POA: Diagnosis not present

## 2018-08-05 DIAGNOSIS — F321 Major depressive disorder, single episode, moderate: Secondary | ICD-10-CM | POA: Diagnosis not present

## 2018-08-21 ENCOUNTER — Encounter: Payer: Self-pay | Admitting: Obstetrics

## 2018-08-28 ENCOUNTER — Ambulatory Visit: Payer: Self-pay

## 2018-08-28 NOTE — Telephone Encounter (Signed)
Phone call from pt. With c/o intermittent  shortness of breath and chest tightness.  Stated the symptoms started yesterday, when she was cooking dinner, and suddenly felt like she couldn't breathe.  Also c/o intermittent chest tightness associated with shortness of breath.  Denied fever.  Reported she is a smoker, and has some cough.  Stated "I haven't been coughing that much, since I have been short of breath."  Reported she went to bed at 11:30 PM, and woke up around 1:30 AM, having shortness of breath; was unable to go back to sleep; stated "I was afraid to go to sleep."  Requesting to be tested for COVID 19.  Advised that [facility] is not routinely testing for the Coronavirus.  Advised with episodes of shortness of breath and chest tightness, she should be evaluated at the ER.  Stated "I don't want to go there and get exposed to other people that are sick; you people ought to be more prepared; this is ridiculous."  Reported she used an inhaler, that she had on hand from a previous illness, during the night, but did not feel it made a difference.  Pt voiced concern about the possibility of COVID 19, and the risk of exposing her children.  Denied travel in past 14 days, or known exposure to someone with the Coronavirus.  Reported she is exposed to a lot of people with her job.  Gave care advice re: care for self at home, handwashing, self isolation, avoiding close contact with others, including family members, and disinfecting high-touch areas.  Advised that symptoms of fever, cough, shortness of breath should be treated at home, but with increased shortness of breath and chest tightness, would recommend going to the ER.  Advised to put on mask when arriving at the ER.  Stated she will rest at home, and if her symptoms worsen, she will proceed to the ER, or call 911.  Will send note to PCP. Pt. Verb. Understanding.        Reason for Disposition . [1] MILD difficulty breathing (e.g., minimal/no SOB at rest, SOB with  walking, pulse <100) AND [2] NEW-onset or WORSE than normal  Answer Assessment - Initial Assessment Questions 1. RESPIRATORY STATUS: "Describe your breathing?" (e.g., wheezing, shortness of breath, unable to speak, severe coughing)      Shortness of breath ; onset 3/25; cooking dinner and felt couldn't breathe; woke up at 1:30 AM with SOB, and unable to get back to sleep 2. ONSET: "When did this breathing problem begin?"      3/25 3. PATTERN "Does the difficult breathing come and go, or has it been constant since it started?"      Comes and goes 4. SEVERITY: "How bad is your breathing?" (e.g., mild, moderate, severe)    - MILD: No SOB at rest, mild SOB with walking, speaks normally in sentences, can lay down, no retractions, pulse < 100.    - MODERATE: SOB at rest, SOB with minimal exertion and prefers to sit, cannot lie down flat, speaks in phrases, mild retractions, audible wheezing, pulse 100-120.    - SEVERE: Very SOB at rest, speaks in single words, struggling to breathe, sitting hunched forward, retractions, pulse > 120      Moderate  5. RECURRENT SYMPTOM: "Have you had difficulty breathing before?" If so, ask: "When was the last time?" and "What happened that time?"      Has had some shortness of breath in past; unsure if this is similar 6. CARDIAC HISTORY: "  Do you have any history of heart disease?" (e.g., heart attack, angina, bypass surgery, angioplasty)      Denied  7. LUNG HISTORY: "Do you have any history of lung disease?"  (e.g., pulmonary embolus, asthma, emphysema)     Feels tight in chest; no hx of asthma; smoker 1/2 PPD 8. CAUSE: "What do you think is causing the breathing problem?"      Afraid of Coronavirus 9. OTHER SYMPTOMS: "Do you have any other symptoms? (e.g., dizziness, runny nose, cough, chest pain, fever)     Some cough with being a smoker, feels hasn't been coughing that much since SOB; intermittent chest tightness between breasts/ under breasts;  denied fever/  chills ; "feels like she is panicking when she isn't breathing correctly"; nasal stuffiness  10. PREGNANCY: "Is there any chance you are pregnant?" "When was your last menstrual period?"       On menstrual cycle now 11. TRAVEL: "Have you traveled out of the country in the last month?" (e.g., travel history, exposures)       Denied  Protocols used: BREATHING DIFFICULTY-A-AH

## 2018-08-29 NOTE — Telephone Encounter (Signed)
Contact  Patient an schedule webex visit for SOB

## 2018-08-29 NOTE — Telephone Encounter (Signed)
I called [phone]) and someone answered the phone and stated that she was not available. I will try to call back later to schedule.

## 2018-09-01 ENCOUNTER — Telehealth: Payer: Self-pay

## 2018-09-01 NOTE — Telephone Encounter (Signed)
Copied from [place] [id number]. Topic: Appointment Scheduling - Scheduling Inquiry for Clinic >> Sep 01, 2018 12:49 PM Rayann Heman wrote: Reason for CRM: pt called back and left a voicemail that she missed a call from the nurse. Looks like this was about scheduling an appointment. Please advise

## 2018-09-02 NOTE — Telephone Encounter (Signed)
Unable to leave vm again.

## 2018-09-02 NOTE — Telephone Encounter (Signed)
Unable to leave vm for the pt, call to offer an video appt (webex ) with [facility] if pt still has symptoms.

## 2018-09-11 ENCOUNTER — Encounter: Payer: Self-pay | Admitting: Family Medicine

## 2018-09-11 ENCOUNTER — Ambulatory Visit (INDEPENDENT_AMBULATORY_CARE_PROVIDER_SITE_OTHER): Payer: BLUE CROSS/BLUE SHIELD | Admitting: Family Medicine

## 2018-09-11 DIAGNOSIS — F4322 Adjustment disorder with anxiety: Secondary | ICD-10-CM | POA: Diagnosis not present

## 2018-09-11 DIAGNOSIS — R0602 Shortness of breath: Secondary | ICD-10-CM

## 2018-09-11 DIAGNOSIS — D509 Iron deficiency anemia, unspecified: Secondary | ICD-10-CM | POA: Diagnosis not present

## 2018-09-11 NOTE — Progress Notes (Signed)
Virtual Visit via Video Note  I connected with Ashley Cole on 09/11/18 at  3:30 PM EDT by a video enabled telemedicine application and verified that I am speaking with the correct person using two identifiers.   I discussed the limitations of evaluation and management by telemedicine and the availability of in person appointments. The patient expressed understanding and agreed to proceed.  History of Present Illness:    Observations/Objective:   Assessment and Plan:   Follow Up Instructions:    I discussed the assessment and treatment plan with the patient. The patient was provided an opportunity to ask questions and all were answered. The patient agreed with the plan and demonstrated an understanding of the instructions.   The patient was advised to call back or seek an in-person evaluation if the symptoms worsen or if the condition fails to improve as anticipated.  I provided 20   Established Patient Office Visit  Subjective:  Patient ID: Ashley Cole, female    DOB: 1986/05/16  Age: 33 y.o. MRN: [id number]  CC:  Chief Complaint  Patient presents with  . Shortness of Breath    SOB, chest tightness, pt states that the SOB gets worse at night, she feels like she's not getting enough oxygen/ 4 days- chest tightness, SOB 1 week    HPI Ashley Cole presents for evaluation of a weeklong history of perceived shortness of breath that seems to be worse when she lays down at night to go to sleep.  Patient denies fevers chills, upper respiratory tract symptoms, chest pain, nausea and vomiting, diaphoresis, dyspnea on exertion, swelling in her lower extremities or history of heart problems.  She says that she is feeling well otherwise.  She was laid off from her job 1 week ago as well.  She recently started fluoxetine for anxiety and depression and has been taking it at night.  She has been taking iron supplementation for iron deficiency anemia.  Hemoglobin from November of this  past year was 11.5.  She does not snore.  Past Medical History:  Diagnosis Date  . Abnormal Pap smear   . Anemia   . Anxiety   . Chlamydia   . GERD (gastroesophageal reflux disease)   . Headache(784.0)   . Human papilloma virus    cells removed x2  . Spinal headache   . Trichomonas   . Urinary tract infection   . Vaginal Pap smear, abnormal     Past Surgical History:  Procedure Laterality Date  . DILATION AND CURETTAGE OF UTERUS    . GYNECOLOGIC CRYOSURGERY    . PILONIDAL CYST EXCISION    . THERAPEUTIC ABORTION      Family History  Problem Relation Age of Onset  . Hypertension Mother   . Fibroids Mother   . Cancer Mother        ovarian  . Cancer Maternal Grandfather        cervical  . Thyroid disease Brother     Social History   Socioeconomic History  . Marital status: Married    Spouse name: Not on file  . Number of children: Not on file  . Years of education: Not on file  . Highest education level: Not on file  Occupational History  . Not on file  Social Needs  . Financial resource strain: Not on file  . Food insecurity:    Worry: Not on file    Inability: Not on file  . Transportation needs:  Medical: Not on file    Non-medical: Not on file  Tobacco Use  . Smoking status: Current Every Day Smoker    Packs/day: 0.25    Years: 14.00    Pack years: 3.50    Types: Cigarettes  . Smokeless tobacco: Never Used  Substance and Sexual Activity  . Alcohol use: Yes    Comment:  socially  . Drug use: No  . Sexual activity: Yes    Birth control/protection: None    Comment: husband is sterile  Lifestyle  . Physical activity:    Days per week: Not on file    Minutes per session: Not on file  . Stress: Not on file  Relationships  . Social connections:    Talks on phone: Not on file    Gets together: Not on file    Attends religious service: Not on file    Active member of club or organization: Not on file    Attends meetings of clubs or  organizations: Not on file    Relationship status: Not on file  . Intimate partner violence:    Fear of current or ex partner: Not on file    Emotionally abused: Not on file    Physically abused: Not on file    Forced sexual activity: Not on file  Other Topics Concern  . Not on file  Social History Narrative  . Not on file    Outpatient Medications Prior to Visit  Medication Sig Dispense Refill  . IRON PO iron    . albuterol (PROVENTIL HFA;VENTOLIN HFA) 108 (90 Base) MCG/ACT inhaler Inhale 2 puffs into the lungs every 6 (six) hours as needed for wheezing or shortness of breath.    Marland Kitchen azelastine (OPTIVAR) 0.05 % ophthalmic solution 1 drop 2 (two) times daily as needed.    . cetirizine (ZYRTEC) 10 MG tablet Take 10 mg by mouth daily.    . fluconazole (DIFLUCAN) 150 MG tablet Take 1 tablet (150 mg total) by mouth daily. Take second tab 3days apart from first tab (Patient not taking: Reported on 09/11/2018) 2 tablet 0  . FLUoxetine (PROZAC) 20 MG capsule     . fluticasone (FLONASE) 50 MCG/ACT nasal spray Place into both nostrils daily.    Marland Kitchen ibuprofen (ADVIL,MOTRIN) 200 MG tablet Take 400 mg by mouth every 6 (six) hours as needed for fever, headache, mild pain, moderate pain or cramping.    . metroNIDAZOLE (METROGEL VAGINAL) 0.75 % vaginal gel Place 1 Applicatorful vaginally at bedtime. (Patient not taking: Reported on 09/11/2018) 70 g 0  . montelukast (SINGULAIR) 10 MG tablet Take 10 mg by mouth at bedtime.     No facility-administered medications prior to visit.     No Known Allergies  ROS Review of Systems  Constitutional: Negative for chills, diaphoresis, fatigue, fever and unexpected weight change.  HENT: Negative for congestion, postnasal drip, rhinorrhea, sinus pressure and sinus pain.   Eyes: Negative for photophobia and visual disturbance.  Respiratory: Positive for shortness of breath. Negative for cough, chest tightness and wheezing.   Cardiovascular: Negative for chest pain,  palpitations and leg swelling.  Gastrointestinal: Negative for nausea and vomiting.  Genitourinary: Negative.   Musculoskeletal: Negative for arthralgias and myalgias.  Skin: Negative for pallor and rash.  Allergic/Immunologic: Negative for immunocompromised state.  Psychiatric/Behavioral: Positive for dysphoric mood. The patient is nervous/anxious.       Objective:    Physical Exam  Constitutional: She is oriented to person, place, and time. She appears well-developed  and well-nourished. No distress.  HENT:  Head: Normocephalic and atraumatic.  Right Ear: External ear normal.  Left Ear: External ear normal.  Eyes: Conjunctivae are normal. Right eye exhibits no discharge. Left eye exhibits no discharge. No scleral icterus.  Neck: No JVD present. No tracheal deviation present.  Pulmonary/Chest: Effort normal. No stridor.  Neurological: She is alert and oriented to person, place, and time.  Skin: She is not diaphoretic.  Psychiatric: She has a normal mood and affect. Her behavior is normal.    There were no vitals taken for this visit. Wt Readings from Last 3 Encounters:  07/16/18 143 lb (64.9 kg)  06/20/18 145 lb (65.8 kg)  04/17/18 143 lb (64.9 kg)     There are no preventive care reminders to display for this patient.  There are no preventive care reminders to display for this patient.  Lab Results  Component Value Date   TSH 1.13 04/17/2018   Lab Results  Component Value Date   WBC 8.3 04/17/2018   HGB 11.5 (L) 04/17/2018   HCT 34.5 (L) 04/17/2018   MCV 88.3 04/17/2018   PLT 309.0 04/17/2018   Lab Results  Component Value Date   NA 139 04/17/2018   K 4.3 04/17/2018   CO2 30 04/17/2018   GLUCOSE 95 04/17/2018   BUN 9 04/17/2018   CREATININE 0.76 04/17/2018   BILITOT 0.3 04/17/2018   ALKPHOS 49 04/17/2018   AST 13 04/17/2018   ALT 12 04/17/2018   PROT 7.2 04/17/2018   ALBUMIN 4.7 04/17/2018   CALCIUM 9.8 04/17/2018   ANIONGAP 7 01/02/2016   GFR 113.03  04/17/2018   Lab Results  Component Value Date   CHOL 143 05/03/2016   Lab Results  Component Value Date   HDL 37.90 (L) 05/03/2016   Lab Results  Component Value Date   LDLCALC 89 05/03/2016   Lab Results  Component Value Date   TRIG 83.0 05/03/2016   Lab Results  Component Value Date   CHOLHDL 4 05/03/2016   No results found for: HGBA1C    Assessment & Plan:   Problem List Items Addressed This Visit      Other   Anemia   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   Follow-up: Return Follow up with [facility].    Libby Maw, MD minutes of non-face-to-face time during this encounter.  Suggested that patient take her fluoxetine in the morning.  Recommended follow-up with her psychiatrist as well as to [facility] to recheck her hemoglobin.  She will proceed to the emergency room with any increasing shortness of breath or chest pain.  Libby Maw, MD

## 2018-09-12 ENCOUNTER — Encounter (HOSPITAL_COMMUNITY): Payer: Self-pay | Admitting: Emergency Medicine

## 2018-09-12 ENCOUNTER — Emergency Department (HOSPITAL_COMMUNITY): Payer: BLUE CROSS/BLUE SHIELD

## 2018-09-12 ENCOUNTER — Other Ambulatory Visit: Payer: Self-pay

## 2018-09-12 ENCOUNTER — Emergency Department (HOSPITAL_COMMUNITY)
Admission: EM | Admit: 2018-09-12 | Discharge: 2018-09-13 | Disposition: A | Discharge status: Home / Self Care | Payer: BLUE CROSS/BLUE SHIELD | Attending: Emergency Medicine | Admitting: Emergency Medicine

## 2018-09-12 DIAGNOSIS — K219 Gastro-esophageal reflux disease without esophagitis: Secondary | ICD-10-CM | POA: Insufficient documentation

## 2018-09-12 DIAGNOSIS — R0602 Shortness of breath: Secondary | ICD-10-CM | POA: Diagnosis not present

## 2018-09-12 DIAGNOSIS — Z79899 Other long term (current) drug therapy: Secondary | ICD-10-CM | POA: Diagnosis not present

## 2018-09-12 DIAGNOSIS — R198 Other specified symptoms and signs involving the digestive system and abdomen: Secondary | ICD-10-CM

## 2018-09-12 DIAGNOSIS — F17210 Nicotine dependence, cigarettes, uncomplicated: Secondary | ICD-10-CM | POA: Insufficient documentation

## 2018-09-12 LAB — CBC WITH DIFFERENTIAL/PLATELET
Abs Immature Granulocytes: 0.02 10*3/uL (ref 0.00–0.07)
Basophils Absolute: 0 10*3/uL (ref 0.0–0.1)
Basophils Relative: 1 %
Eosinophils Absolute: 0.1 10*3/uL (ref 0.0–0.5)
Eosinophils Relative: 1 %
HCT: 35.5 % — ABNORMAL LOW (ref 36.0–46.0)
Hemoglobin: 11.7 g/dL — ABNORMAL LOW (ref 12.0–15.0)
Immature Granulocytes: 0 %
Lymphocytes Relative: 32 %
Lymphs Abs: 2.8 10*3/uL (ref 0.7–4.0)
MCH: 29 pg (ref 26.0–34.0)
MCHC: 33 g/dL (ref 30.0–36.0)
MCV: 87.9 fL (ref 80.0–100.0)
Monocytes Absolute: 0.7 10*3/uL (ref 0.1–1.0)
Monocytes Relative: 8 %
Neutro Abs: 5.2 10*3/uL (ref 1.7–7.7)
Neutrophils Relative %: 58 %
Platelets: 292 10*3/uL (ref 150–400)
RBC: 4.04 MIL/uL (ref 3.87–5.11)
RDW: 12.6 % (ref 11.5–15.5)
WBC: 8.8 10*3/uL (ref 4.0–10.5)
nRBC: 0 % (ref 0.0–0.2)

## 2018-09-12 MED ORDER — FAMOTIDINE 20 MG PO TABS
20.0000 mg | ORAL_TABLET | Freq: Once | ORAL | Status: AC
Start: 2018-09-12 — End: 2018-09-13
  Administered 2018-09-13: 20 mg via ORAL
  Filled 2018-09-12: qty 1

## 2018-09-12 MED ORDER — SODIUM CHLORIDE 0.9 % IV BOLUS
500.0000 mL | Freq: Once | INTRAVENOUS | Status: AC
  Administered 2018-09-13: 500 mL via INTRAVENOUS

## 2018-09-12 NOTE — ED Notes (Signed)
Patient transported to X-ray 

## 2018-09-12 NOTE — ED Triage Notes (Signed)
C/o feeling like she isn't getting enough oxygen x 1 week.  Using Albuterol inhaler without relief.  Denies cough.

## 2018-09-13 LAB — COMPREHENSIVE METABOLIC PANEL
ALT: 17 U/L (ref 0–44)
AST: 18 U/L (ref 15–41)
Albumin: 4.4 g/dL (ref 3.5–5.0)
Alkaline Phosphatase: 51 U/L (ref 38–126)
Anion gap: 8 (ref 5–15)
BUN: 6 mg/dL (ref 6–20)
CO2: 25 mmol/L (ref 22–32)
Calcium: 9.9 mg/dL (ref 8.9–10.3)
Chloride: 105 mmol/L (ref 98–111)
Creatinine, Ser: 0.62 mg/dL (ref 0.44–1.00)
GFR calc Af Amer: >60 mL/min (ref >60)
GFR calc non Af Amer: >60 mL/min (ref >60)
Glucose, Bld: 93 mg/dL (ref 70–99)
Potassium: 4 mmol/L (ref 3.5–5.1)
Sodium: 138 mmol/L (ref 135–145)
Total Bilirubin: 0.2 mg/dL — ABNORMAL LOW (ref 0.3–1.2)
Total Protein: 7.4 g/dL (ref 6.5–8.1)

## 2018-09-13 LAB — LIPASE, BLOOD: Lipase: 28 U/L (ref 11–51)

## 2018-09-13 LAB — TROPONIN I: Troponin I: <0.03 ng/mL (ref <0.03)

## 2018-09-13 MED ORDER — FAMOTIDINE 20 MG PO TABS: 20.0000 mg | ORAL_TABLET | Freq: Two times a day (BID) | ORAL | 0 refills | Status: DC

## 2018-09-13 MED ORDER — CETIRIZINE HCL 10 MG PO TABS: 10.0000 mg | ORAL_TABLET | Freq: Every day | ORAL | 0 refills | Status: DC

## 2018-09-13 MED ORDER — OMEPRAZOLE 20 MG PO CPDR: 20.0000 mg | DELAYED_RELEASE_CAPSULE | Freq: Every day | ORAL | 0 refills | Status: DC

## 2018-09-13 NOTE — ED Provider Notes (Signed)
[facility] EMERGENCY DEPARTMENT Provider Note   CSN: [id number] Arrival date & time: 09/12/18  2042    History   Chief Complaint Chief Complaint  Patient presents with  . Shortness of Breath    HPI Ashley Cole is a 33 y.o. female with a hx of anemia, GERD, anxiety presents to the Emergency Department complaining of gradual, persistent, progressively worsening shortness of breath with associated chest tightness onset 3 days ago.  Pt reports initially the symptoms were only at night, but did prevent her from sleeping. She reports today the symptoms were present during the day. She reports having an eVisit with her PCP yesterday who recommended she use her inhaler.  Pt denies a hx of asthma, but reports using an inhaler in the past when she has been sick.  She denies ER visits, hospitalization or intubation for breathing problems in the past.  Pt reports that use of her inhaler has not helped her symptoms at all.  Record review shows pt has been prescribed zyrtec and flonase in the past, but patient reports she is not using them.  She reports "reflux symptoms" as well describing these as a burning in her chest worse at night.  She is not taking any medication for this.  She reports a hx of same.  Pt denies abd pain, fever, chills, headache, neck pain, N/V/D, weakness, dizziness, syncope, dysuria, hematuria.  Pt denies previous cardiac hx.  Laying flat makes the symptoms worse.  Nothing makes them better.  Pt denies periods of immobilization, leg swelling, calf pain, dyspnea on exertion, hx of DVT, estrogen usage or current birth control, hx of lupus.  Pt is an everyday smoker.  She denies new cough.      The history is provided by the patient and medical records. No language interpreter was used.    Past Medical History:  Diagnosis Date  . Abnormal Pap smear   . Anemia   . Anxiety   . Chlamydia   . GERD (gastroesophageal reflux disease)   . Headache(784.0)   .  Human papilloma virus    cells removed x2  . Spinal headache   . Trichomonas   . Urinary tract infection   . Vaginal Pap smear, abnormal     Patient Active Problem List   Diagnosis Date Noted  . SOB (shortness of breath) 09/11/2018  . Acute sinus infection 06/07/2017  . Anxious mood as adjustment reaction 05/07/2017  . Family history of cervical cancer 05/03/2016  . Family history of ovarian cancer 05/03/2016  . Anemia 05/03/2016  . GASTROESOPHAGEAL REFLUX DISEASE 07/09/2007  . ABDOMINAL PAIN 07/09/2007  . EPIGASTRIC PAIN 07/09/2007    Past Surgical History:  Procedure Laterality Date  . DILATION AND CURETTAGE OF UTERUS    . GYNECOLOGIC CRYOSURGERY    . PILONIDAL CYST EXCISION    . THERAPEUTIC ABORTION       OB History    Gravida  7   Para  3   Term  3   Preterm  0   AB  4   Living  3     SAB  0   TAB  4   Ectopic  0   Multiple  0   Live Births  1            Home Medications    Prior to Admission medications   Medication Sig Start Date End Date Taking? Authorizing Provider  albuterol (PROVENTIL HFA;VENTOLIN HFA) 108 (90 Base) MCG/ACT inhaler  Inhale 2 puffs into the lungs every 6 (six) hours as needed for wheezing or shortness of breath.    [provider]  azelastine (OPTIVAR) 0.05 % ophthalmic solution 1 drop 2 (two) times daily as needed.    [provider]  cetirizine (ZYRTEC ALLERGY) 10 MG tablet Take 1 tablet (10 mg total) by mouth daily. 09/13/18   Levoy Geisen, Jarrett Soho, PA-C  famotidine (PEPCID) 20 MG tablet Take 1 tablet (20 mg total) by mouth 2 (two) times daily. 09/13/18   Susa Bones, Jarrett Soho, PA-C  fluconazole (DIFLUCAN) 150 MG tablet Take 1 tablet (150 mg total) by mouth daily. Take second tab 3days apart from first tab Patient not taking: Reported on 09/11/2018 07/16/18   Flossie Buffy, NP  FLUoxetine (PROZAC) 20 MG capsule  09/08/18   [provider]  fluticasone (FLONASE) 50 MCG/ACT nasal spray Place into  both nostrils daily.    [provider]  ibuprofen (ADVIL,MOTRIN) 200 MG tablet Take 400 mg by mouth every 6 (six) hours as needed for fever, headache, mild pain, moderate pain or cramping.    [provider]  IRON PO iron    [provider]  metroNIDAZOLE (METROGEL VAGINAL) 0.75 % vaginal gel Place 1 Applicatorful vaginally at bedtime. Patient not taking: Reported on 09/11/2018 07/18/18   Nche, Charlene Brooke, NP  montelukast (SINGULAIR) 10 MG tablet Take 10 mg by mouth at bedtime.    [provider]  omeprazole (PRILOSEC) 20 MG capsule Take 1 capsule (20 mg total) by mouth daily. 09/13/18   Luisana Lutzke, Jarrett Soho, PA-C    Family History Family History  Problem Relation Age of Onset  . Hypertension Mother   . Fibroids Mother   . Cancer Mother        ovarian  . Cancer Maternal Grandfather        cervical  . Thyroid disease Brother     Social History Social History   Tobacco Use  . Smoking status: Current Every Day Smoker    Packs/day: 0.25    Years: 14.00    Pack years: 3.50    Types: Cigarettes  . Smokeless tobacco: Never Used  Substance Use Topics  . Alcohol use: Yes    Comment:  socially  . Drug use: No     Allergies   Patient has no known allergies.   Review of Systems Review of Systems  Constitutional: Negative for appetite change, diaphoresis, fatigue, fever and unexpected weight change.  HENT: Negative for mouth sores.   Eyes: Negative for visual disturbance.  Respiratory: Positive for chest tightness and shortness of breath. Negative for cough and wheezing.   Cardiovascular: Negative for chest pain and leg swelling.  Gastrointestinal: Negative for abdominal pain, constipation, diarrhea, nausea and vomiting.  Endocrine: Negative for polydipsia, polyphagia and polyuria.  Genitourinary: Negative for dysuria, frequency, hematuria and urgency.  Musculoskeletal: Negative for back pain and neck stiffness.  Skin: Negative for rash.   Allergic/Immunologic: Negative for immunocompromised state.  Neurological: Negative for syncope, light-headedness and headaches.  Hematological: Does not bruise/bleed easily.  Psychiatric/Behavioral: Negative for sleep disturbance. The patient is nervous/anxious.      Physical Exam Updated Vital Signs BP 119/79   Pulse 84   Temp 98.7 F (37.1 C) (Oral)   Resp 20   LMP 08/25/2018   SpO2 99%   Physical Exam Vitals signs and nursing note reviewed.  Constitutional:      General: She is not in acute distress.    Appearance: She is well-developed. She  is not diaphoretic.     Comments: Awake, alert, nontoxic appearance  HENT:     Head: Normocephalic and atraumatic.     Mouth/Throat:     Pharynx: No oropharyngeal exudate.  Eyes:     General: No scleral icterus.    Conjunctiva/sclera: Conjunctivae normal.  Neck:     Musculoskeletal: Normal range of motion and neck supple.  Cardiovascular:     Rate and Rhythm: Regular rhythm. Tachycardia present.  Pulmonary:     Effort: Pulmonary effort is normal. No tachypnea, accessory muscle usage, respiratory distress or retractions.     Breath sounds: Normal breath sounds. No decreased breath sounds, wheezing, rhonchi or rales.  Abdominal:     General: Bowel sounds are normal.     Palpations: Abdomen is soft. There is no mass.     Tenderness: There is no abdominal tenderness. There is no guarding or rebound.  Musculoskeletal: Normal range of motion.  Skin:    General: Skin is warm and dry.  Neurological:     Mental Status: She is alert.     Comments: Speech is clear and goal oriented Moves extremities without ataxia  Psychiatric:        Mood and Affect: Mood is anxious.     Comments: Pt is very anxious about being in the ED      ED Treatments / Results  Labs (all labs ordered are listed, but only abnormal results are displayed) Labs Reviewed  CBC WITH DIFFERENTIAL/PLATELET - Abnormal; Notable for the following components:       Result Value   Hemoglobin 11.7 (*)    HCT 35.5 (*)    All other components within normal limits  COMPREHENSIVE METABOLIC PANEL - Abnormal; Notable for the following components:   Total Bilirubin 0.2 (*)    All other components within normal limits  LIPASE, BLOOD  TROPONIN I  I-STAT BETA HCG BLOOD, ED ([facility], [facility], [facility] ONLY)    EKG EKG Interpretation  Date/Time:  Friday September 12 2018 23:20:11 EDT Ventricular Rate:  101 PR Interval:    QRS Duration: 90 QT Interval:  349 QTC Calculation: 453 R Axis:   73 Text Interpretation:  Sinus tachycardia Baseline wander in lead(s) V5 Otherwise within normal limits When compared with ECG of 11/03/2015, No significant change was found Confirmed by Delora Fuel ([id number]) on 09/12/2018 11:23:45 PM   Radiology Dg Chest 2 View  Result Date: 09/13/2018 CLINICAL DATA:  Chest tightness. EXAM: CHEST - 2 VIEW COMPARISON:  08/07/2017 FINDINGS: The cardiomediastinal contours are normal. The lungs are clear. Pulmonary vasculature is normal. No consolidation, pleural effusion, or pneumothorax. No acute osseous abnormalities are seen. IMPRESSION: Unremarkable radiographs of the chest. Electronically Signed   By: Keith Rake M.D.   On: 09/13/2018 00:16    Procedures Procedures (including critical care time)  Medications Ordered in ED Medications  sodium chloride 0.9 % bolus 500 mL (0 mLs Intravenous Stopped 09/13/18 0106)  famotidine (PEPCID) tablet 20 mg (20 mg Oral Given 09/13/18 0001)     Initial Impression / Assessment and Plan / ED Course  I have reviewed the triage vital signs and the nursing notes.  Pertinent labs & imaging results that were available during my care of the patient were reviewed by me and considered in my medical decision making (see chart for details).  Clinical Course as of Sep 13 127  Fri Sep 12, 2018  2353 baseline  Hemoglobin(!): 11.7 [HM]  Sat Sep 13, 2018  0126 Pt symptoms improved  after Pepcid.     [HM]  0126 No  persistent tachycardia  Pulse Rate: 82 [HM]    Clinical Course User Index [HM] Archita Lomeli, Payden, Bonus was evaluated in Emergency Department on 09/13/2018 for the symptoms described in the history of present illness. She was evaluated in the context of the global COVID-19 pandemic, which necessitated consideration that the patient might be at risk for infection with the SARS-CoV-2 virus that causes COVID-19. Institutional protocols and algorithms that pertain to the evaluation of patients at risk for COVID-19 are in a state of rapid change based on information released by regulatory bodies including the CDC and federal and state organizations. These policies and algorithms were followed during the patient's care in the ED.   Patient here with complaints of shortness of breath.  She is afebrile without cough or hypoxia.  Chest x-ray is without signs of infiltrate, pneumothorax or pulmonary edema.  I personally evaluated these images.  Patient does have a history of anemia however this appears to be baseline for her.  Less likely for her shortness of breath to be secondary to her anemia.  Patient is tachycardic on arrival however she is very anxious.  She has no risk factors for pulmonary embolism.  After a long discussion with her, her heart rate has improved.  Additionally, patient is complaining of chest tightness and reflux type symptoms.  Pepcid given here in the emergency department with significant improvement in her symptoms of shortness of breath and chest tightness.  Troponin is negative and EKG is without ischemia.  Likely to be acute coronary syndrome.  No elevation in AST/ALT or lipase.  Less likely to be cholecystitis or pancreatitis.  Patient has no abdominal pain.  Patient has been using her inhaler at home without relief.  On exam today she has no wheezing, retractions or tachypnea.  Do not believe she is having an asthma exacerbation.  Patient will be discharged  with treatment for reflux symptoms and allergies.  Discussed home albuterol usage and reasons to return immediately to the emergency department.  Patient states understanding and is in agreement with the plan.    Final Clinical Impressions(s) / ED Diagnoses   Final diagnoses:  SOB (shortness of breath)  Symptoms of gastroesophageal reflux    ED Discharge Orders         Ordered    famotidine (PEPCID) 20 MG tablet  2 times daily     09/13/18 0127    omeprazole (PRILOSEC) 20 MG capsule  Daily     09/13/18 0127    cetirizine (ZYRTEC ALLERGY) 10 MG tablet  Daily     09/13/18 0127           Michaline Kindig, Gwenlyn Perking 56/25/63 [id number]    Delora Fuel, MD 34/28/76 [phone]

## 2018-09-13 NOTE — Discharge Instructions (Addendum)
1. Medications: Pepcid, omeprazole, usual home medications 2. Treatment: rest, drink plenty of fluids, begin OTC antihistamine (Zyrtec or Claritin), flonase daily at home, albuterol as needed at home  3. Follow Up: Please followup with your primary doctor in 2-3 days for discussion of your diagnoses and further evaluation after today's visit; if you do not have a primary care doctor use the resource guide provided to find one; Please return to the ER for difficulty breathing, high fevers or worsening symptoms.

## 2018-09-15 LAB — I-STAT BETA HCG BLOOD, ED (MC, WL, AP ONLY): I-stat hCG, quantitative: <5 m[IU]/mL (ref <5)

## 2018-11-19 ENCOUNTER — Other Ambulatory Visit: Payer: Self-pay

## 2018-11-19 ENCOUNTER — Encounter: Payer: Self-pay | Admitting: Nurse Practitioner

## 2018-11-19 ENCOUNTER — Encounter: Payer: BC Managed Care – PPO | Admitting: Nurse Practitioner

## 2018-11-20 NOTE — Progress Notes (Signed)
This encounter was created in error - please disregard.

## 2018-11-24 ENCOUNTER — Encounter: Payer: Self-pay | Admitting: Nurse Practitioner

## 2018-11-24 ENCOUNTER — Ambulatory Visit (INDEPENDENT_AMBULATORY_CARE_PROVIDER_SITE_OTHER): Payer: BC Managed Care – PPO | Admitting: Nurse Practitioner

## 2018-11-24 ENCOUNTER — Other Ambulatory Visit: Payer: Self-pay

## 2018-11-24 VITALS — Ht 60.0 in | Wt 141.0 lb

## 2018-11-24 DIAGNOSIS — J069 Acute upper respiratory infection, unspecified: Secondary | ICD-10-CM

## 2018-11-24 DIAGNOSIS — Z20828 Contact with and (suspected) exposure to other viral communicable diseases: Secondary | ICD-10-CM

## 2018-11-24 DIAGNOSIS — Z20822 Contact with and (suspected) exposure to covid-19: Secondary | ICD-10-CM

## 2018-11-24 MED ORDER — ACETAMINOPHEN 500 MG PO TABS: 500.0000 mg | ORAL_TABLET | Freq: Four times a day (QID) | ORAL | 0 refills | Status: DC | PRN

## 2018-11-24 NOTE — Patient Instructions (Signed)
Use discharge summary from [facility] as proof of pending test. Return to work if test is negative. Have results faxed to this office. You may use tylenol 500mg  or ibuprofen 400mg  every 6hrs as needed for pain. May also use warm salt water gaggle or chloraseptic spray as needed for sorethroat.

## 2018-11-24 NOTE — Progress Notes (Signed)
Virtual Visit via Video Note  I connected with Ashley Cole on 11/24/18 at  8:15 AM EDT by a video enabled telemedicine application and verified that I am speaking with the correct person using two identifiers.  Location: Patient: Home  Provider: Office   I discussed the limitations of evaluation and management by telemedicine and the availability of in person appointments. The patient expressed understanding and agreed to proceed.  CC: pt is c/o of left side ear pain, painful when swallow.gum swollen,coughing and headache at times/going on 4 days/went [facility]day [facility] uti--got abx--not much better/ out of work consult? FYI--25 people tested positive at work.   History of Present Illness:  Sore Throat  This is a new problem. Episode onset: on saturday. The problem has been unchanged. There has been no fever. Associated symptoms include ear pain and headaches. Pertinent negatives include no congestion, coughing, ear discharge, hoarse voice, plugged ear sensation, neck pain, shortness of breath, stridor, swollen glands, trouble swallowing or vomiting. She has had no exposure to strep or mono. She has tried nothing for the symptoms.  examined by provider at [facility] on saturday reports she was tested for COVID-19 on Saturday, test pending. Started on macrobid for dysuria. Last dental exam 29months ago, normal per patient   Observations/Objective: Physical Exam  Constitutional: She is oriented to person, place, and time. No distress.  HENT:  Mouth/Throat: Uvula is midline. No oral lesions. No trismus in the jaw. Normal dentition. No dental abscesses. No oropharyngeal exudate or posterior oropharyngeal erythema.  Neck: Trachea normal and normal range of motion. Neck supple. No thyroid mass present.  Pulmonary/Chest: Effort normal.  Neurological: She is alert and oriented to person, place, and time.  Skin: No rash noted. No erythema.   Assessment and Plan: Ashley Cole was seen  today for sore throat.  Diagnoses and all orders for this visit:  Upper respiratory tract infection, unspecified type -     acetaminophen (TYLENOL) 500 MG tablet; Take 1 tablet (500 mg total) by mouth every 6 (six) hours as needed.  Exposure to Covid-19 Virus   Follow Up Instructions: Use discharge summary from [facility] as proof of pending test. Return to work if test is negative. Have results faxed to this office. You may use tylenol 500mg  or ibuprofen 400mg  every 6hrs as needed for pain. May also use warm salt water gaggle or chloraseptic spray as needed for sorethroat.   I discussed the assessment and treatment plan with the patient. The patient was provided an opportunity to ask questions and all were answered. The patient agreed with the plan and demonstrated an understanding of the instructions.   The patient was advised to call back or seek an in-person evaluation if the symptoms worsen or if the condition fails to improve as anticipated.   Wilfred Lacy, NP

## 2018-12-16 ENCOUNTER — Ambulatory Visit (INDEPENDENT_AMBULATORY_CARE_PROVIDER_SITE_OTHER): Payer: BC Managed Care – PPO | Admitting: Psychology

## 2018-12-16 DIAGNOSIS — F411 Generalized anxiety disorder: Secondary | ICD-10-CM | POA: Diagnosis not present

## 2018-12-16 DIAGNOSIS — F321 Major depressive disorder, single episode, moderate: Secondary | ICD-10-CM

## 2018-12-16 DIAGNOSIS — F4323 Adjustment disorder with mixed anxiety and depressed mood: Secondary | ICD-10-CM

## 2018-12-24 ENCOUNTER — Ambulatory Visit: Payer: BC Managed Care – PPO | Admitting: Psychology

## 2019-01-20 ENCOUNTER — Ambulatory Visit (INDEPENDENT_AMBULATORY_CARE_PROVIDER_SITE_OTHER): Payer: BC Managed Care – PPO | Admitting: Psychology

## 2019-01-20 DIAGNOSIS — F321 Major depressive disorder, single episode, moderate: Secondary | ICD-10-CM | POA: Diagnosis not present

## 2019-01-20 DIAGNOSIS — F411 Generalized anxiety disorder: Secondary | ICD-10-CM

## 2019-02-02 ENCOUNTER — Ambulatory Visit (INDEPENDENT_AMBULATORY_CARE_PROVIDER_SITE_OTHER): Payer: BC Managed Care – PPO | Admitting: Psychology

## 2019-02-02 DIAGNOSIS — F321 Major depressive disorder, single episode, moderate: Secondary | ICD-10-CM | POA: Diagnosis not present

## 2019-02-02 DIAGNOSIS — F411 Generalized anxiety disorder: Secondary | ICD-10-CM | POA: Diagnosis not present

## 2019-02-16 ENCOUNTER — Ambulatory Visit: Payer: BC Managed Care – PPO | Admitting: Psychology

## 2019-02-23 ENCOUNTER — Other Ambulatory Visit: Payer: Self-pay

## 2019-02-24 ENCOUNTER — Ambulatory Visit (INDEPENDENT_AMBULATORY_CARE_PROVIDER_SITE_OTHER): Payer: BC Managed Care – PPO | Admitting: Nurse Practitioner

## 2019-02-24 ENCOUNTER — Encounter: Payer: Self-pay | Admitting: Nurse Practitioner

## 2019-02-24 VITALS — BP 112/68 | HR 69 | Temp 98.3°F | Ht 60.0 in | Wt 142.2 lb

## 2019-02-24 DIAGNOSIS — K2100 Gastro-esophageal reflux disease with esophagitis, without bleeding: Secondary | ICD-10-CM

## 2019-02-24 DIAGNOSIS — R350 Frequency of micturition: Secondary | ICD-10-CM

## 2019-02-24 DIAGNOSIS — R3121 Asymptomatic microscopic hematuria: Secondary | ICD-10-CM | POA: Insufficient documentation

## 2019-02-24 DIAGNOSIS — K5909 Other constipation: Secondary | ICD-10-CM

## 2019-02-24 DIAGNOSIS — R311 Benign essential microscopic hematuria: Secondary | ICD-10-CM | POA: Diagnosis not present

## 2019-02-24 LAB — POCT URINALYSIS DIPSTICK
Bilirubin, UA: NEGATIVE
Glucose, UA: NEGATIVE
Ketones, UA: 5
Leukocytes, UA: NEGATIVE
Nitrite, UA: NEGATIVE
Protein, UA: POSITIVE — AB
Spec Grav, UA: 1.02 (ref 1.010–1.025)
Urobilinogen, UA: 0.2 E.U./dL
pH, UA: 6.5 (ref 5.0–8.0)

## 2019-02-24 MED ORDER — PANTOPRAZOLE SODIUM 20 MG PO TBEC: 20.0000 mg | DELAYED_RELEASE_TABLET | Freq: Two times a day (BID) | ORAL | 0 refills | Status: DC

## 2019-02-24 MED ORDER — LUBIPROSTONE 8 MCG PO CAPS: 8.0000 ug | ORAL_CAPSULE | Freq: Two times a day (BID) | ORAL | 5 refills | Status: DC

## 2019-02-24 MED ORDER — SULFAMETHOXAZOLE-TRIMETHOPRIM 800-160 MG PO TABS: 1.0000 | ORAL_TABLET | Freq: Two times a day (BID) | ORAL | 0 refills | Status: DC

## 2019-02-24 NOTE — Assessment & Plan Note (Signed)
Evaluated by urology at [facility] 04/2018 Normal cystoscopy. Recommended annual UA and reval by urology if hematuria persist for 3-92yrs.

## 2019-02-24 NOTE — Progress Notes (Addendum)
Subjective:  Patient ID: Ashley Cole, female    DOB: 09-May-1986  Age: 33 y.o. MRN: [id number]  CC: Urinary Tract Infection (pt is c/o of frequent urinate,pressure,cloudy and odor in urine,side pain at times/1 wk/ a) and Heartburn (referral consult--never get to see GI)  Urinary Tract Infection  This is a recurrent problem. The problem occurs every urination. The problem has been unchanged. The pain is at a severity of 0/10. The patient is experiencing no pain. There has been no fever. She is sexually active. There is no history of pyelonephritis. Associated symptoms include frequency. Pertinent negatives include no chills, discharge, flank pain, hematuria, hesitancy, nausea, possible pregnancy, sweats, urgency or vomiting. She has tried increased fluids for the symptoms. The treatment provided no relief. Her past medical history is significant for urinary stasis. There is no history of catheterization, kidney stones, recurrent UTIs or a urological procedure.  Constipation This is a chronic problem. The current episode started more than 1 year ago. The problem has been waxing and waning since onset. Her stool frequency is 1 time per week or less. The stool is described as firm and pellet like. The patient is not on a high fiber diet. She does not exercise regularly. There has not been adequate water intake. Associated symptoms include bloating. Pertinent negatives include no abdominal pain, anorexia, back pain, diarrhea, difficulty urinating, fecal incontinence, fever, flatus, hematochezia, hemorrhoids, melena, nausea, rectal pain, vomiting or weight loss. Risk factors include obesity and stress. She has tried enemas, laxatives and stool softeners for the symptoms. The treatment provided mild relief. Her past medical history is significant for irritable bowel syndrome and psychiatric history. There is no history of metabolic disease, neurologic disease or neuromuscular disease.  Gastroesophageal Reflux  She complains of belching, globus sensation and heartburn. She reports no abdominal pain, no chest pain, no choking, no coughing, no dysphagia, no hoarse voice, no nausea, no sore throat, no stridor, no tooth decay, no water brash or no wheezing. This is a chronic problem. The current episode started more than 1 year ago. The problem occurs constantly. The problem has been waxing and waning. The heartburn is located in the substernum and abdomen. The heartburn does not wake her from sleep. The heartburn does not limit her activity. The heartburn doesn't change with position. The symptoms are aggravated by certain foods and stress. Pertinent negatives include no anemia, fatigue, melena, muscle weakness, orthopnea or weight loss. Risk factors include lack of exercise and obesity. She has tried an antacid, a PPI, a histamine-2 antagonist and a diet change for the symptoms. The treatment provided no relief.  unable to schedule appt with [facility] because they are not able to get records from [place]. She was dismissed from that practice due to multiple no show appts.  Reviewed past Medical, Social and Family history today.  Outpatient Medications Prior to Visit  Medication Sig Dispense Refill  . acetaminophen (TYLENOL) 500 MG tablet Take 1 tablet (500 mg total) by mouth every 6 (six) hours as needed. 30 tablet 0  . albuterol (PROVENTIL HFA;VENTOLIN HFA) 108 (90 Base) MCG/ACT inhaler Inhale 2 puffs into the lungs every 6 (six) hours as needed for wheezing or shortness of breath.    Marland Kitchen azelastine (OPTIVAR) 0.05 % ophthalmic solution 1 drop 2 (two) times daily as needed.    Marland Kitchen FLUoxetine (PROZAC) 20 MG capsule     . fluticasone (FLONASE) 50 MCG/ACT nasal spray Place into both nostrils daily.    . IRON  PO iron    . montelukast (SINGULAIR) 10 MG tablet Take 10 mg by mouth at bedtime.    Marland Kitchen ibuprofen (ADVIL,MOTRIN) 200 MG tablet Take 400 mg by mouth every 6 (six) hours as needed for fever, headache, mild pain,  moderate pain or cramping.    Marland Kitchen omeprazole (PRILOSEC) 20 MG capsule Take 1 capsule (20 mg total) by mouth daily. 30 capsule 0  . cetirizine (ZYRTEC ALLERGY) 10 MG tablet Take 1 tablet (10 mg total) by mouth daily. (Patient not taking: Reported on 02/24/2019) 30 tablet 0  . famotidine (PEPCID) 20 MG tablet Take 1 tablet (20 mg total) by mouth 2 (two) times daily. (Patient not taking: Reported on 02/24/2019) 30 tablet 0  . fluconazole (DIFLUCAN) 150 MG tablet Take 1 tablet (150 mg total) by mouth daily. Take second tab 3days apart from first tab (Patient not taking: Reported on 09/11/2018) 2 tablet 0  . metroNIDAZOLE (METROGEL VAGINAL) 0.75 % vaginal gel Place 1 Applicatorful vaginally at bedtime. (Patient not taking: Reported on 09/11/2018) 70 g 0   No facility-administered medications prior to visit.    ROS See HPI  Objective:  BP 112/68   Pulse 69   Temp 98.3 F (36.8 C) (Tympanic)   Ht 5' (1.524 m)   Wt 142 lb 3.2 oz (64.5 kg)   SpO2 95%   BMI 27.77 kg/m   BP Readings from Last 3 Encounters:  02/24/19 112/68  09/13/18 108/84  07/16/18 120/74    Wt Readings from Last 3 Encounters:  02/24/19 142 lb 3.2 oz (64.5 kg)  11/24/18 141 lb (64 kg)  07/16/18 143 lb (64.9 kg)    Physical Exam Cardiovascular:     Rate and Rhythm: Normal rate.     Pulses: Normal pulses.  Pulmonary:     Effort: Pulmonary effort is normal.  Abdominal:     General: Bowel sounds are normal.     Palpations: Abdomen is soft.     Tenderness: There is no abdominal tenderness. There is no right CVA tenderness, left CVA tenderness or guarding.  Neurological:     Mental Status: She is alert and oriented to person, place, and time.    Lab Results  Component Value Date   WBC 8.8 09/12/2018   HGB 11.7 (L) 09/12/2018   HCT 35.5 (L) 09/12/2018   PLT 292 09/12/2018   GLUCOSE 93 09/12/2018   CHOL 143 05/03/2016   TRIG 83.0 05/03/2016   HDL 37.90 (L) 05/03/2016   LDLCALC 89 05/03/2016   ALT 17 09/12/2018    AST 18 09/12/2018   NA 138 09/12/2018   K 4.0 09/12/2018   CL 105 09/12/2018   CREATININE 0.62 09/12/2018   BUN 6 09/12/2018   CO2 25 09/12/2018   TSH 1.13 04/17/2018   Dg Chest 2 View  Result Date: 09/13/2018 CLINICAL DATA:  Chest tightness. EXAM: CHEST - 2 VIEW COMPARISON:  08/07/2017 FINDINGS: The cardiomediastinal contours are normal. The lungs are clear. Pulmonary vasculature is normal. No consolidation, pleural effusion, or pneumothorax. No acute osseous abnormalities are seen. IMPRESSION: Unremarkable radiographs of the chest. Electronically Signed   By: Keith Rake M.D.   On: 09/13/2018 00:16   Assessment & Plan:   Tanasia was seen today for urinary tract infection and heartburn.  Diagnoses and all orders for this visit:  Gastroesophageal reflux disease with esophagitis -     Ambulatory referral to Gastroenterology  Frequent urination -     POCT urinalysis dipstick -  Urine Culture -     Discontinue: sulfamethoxazole-trimethoprim (BACTRIM DS) 800-160 MG tablet; Take 1 tablet by mouth 2 (two) times daily. With food -     sulfamethoxazole-trimethoprim (BACTRIM DS) 800-160 MG tablet; Take 1 tablet by mouth 2 (two) times daily. With food  Benign essential microscopic hematuria -     Urine Culture  Chronic constipation -     lubiprostone (AMITIZA) 8 MCG capsule; Take 1 capsule (8 mcg total) by mouth 2 (two) times daily with a meal. -     pantoprazole (PROTONIX) 20 MG tablet; Take 1 tablet (20 mg total) by mouth 2 (two) times daily before a meal. -     Ambulatory referral to Gastroenterology   I have discontinued Caryl Pina L. Seide's ibuprofen, fluconazole, metroNIDAZOLE, famotidine, omeprazole, and cetirizine. I am also having her start on lubiprostone and pantoprazole. Additionally, I am having her maintain her IRON PO, fluticasone, montelukast, albuterol, azelastine, FLUoxetine, acetaminophen, and sulfamethoxazole-trimethoprim.  Meds ordered this encounter   Medications  . DISCONTD: sulfamethoxazole-trimethoprim (BACTRIM DS) 800-160 MG tablet    Sig: Take 1 tablet by mouth 2 (two) times daily. With food    Dispense:  6 tablet    Refill:  0    Order Specific Question:   Supervising Provider    Answer:   Lucille Passy [[id number]]  . lubiprostone (AMITIZA) 8 MCG capsule    Sig: Take 1 capsule (8 mcg total) by mouth 2 (two) times daily with a meal.    Dispense:  30 capsule    Refill:  5    Order Specific Question:   Supervising Provider    Answer:   Lucille Passy [[id number]]  . pantoprazole (PROTONIX) 20 MG tablet    Sig: Take 1 tablet (20 mg total) by mouth 2 (two) times daily before a meal.    Dispense:  30 tablet    Refill:  0    Order Specific Question:   Supervising Provider    Answer:   Lucille Passy [[id number]]  . sulfamethoxazole-trimethoprim (BACTRIM DS) 800-160 MG tablet    Sig: Take 1 tablet by mouth 2 (two) times daily. With food    Dispense:  6 tablet    Refill:  0    Order Specific Question:   Supervising Provider    Answer:   Lucille Passy [[id number]]    Problem List Items Addressed This Visit      Digestive   GASTROESOPHAGEAL REFLUX DISEASE - Primary   Relevant Medications   lubiprostone (AMITIZA) 8 MCG capsule   pantoprazole (PROTONIX) 20 MG tablet   Other Relevant Orders   Ambulatory referral to Gastroenterology     Genitourinary   Asymptomatic microscopic hematuria    Evaluated by urology at [facility] 04/2018 Normal cystoscopy. Recommended annual UA and reval by urology if hematuria persist for 3-2yrs.       Other Visit Diagnoses    Frequent urination       Relevant Medications   sulfamethoxazole-trimethoprim (BACTRIM DS) 800-160 MG tablet   Other Relevant Orders   POCT urinalysis dipstick (Completed)   Urine Culture (Completed)   Chronic constipation       Relevant Medications   lubiprostone (AMITIZA) 8 MCG capsule   pantoprazole (PROTONIX) 20 MG tablet   Other Relevant Orders   Ambulatory referral to  Gastroenterology      Follow-up: Return in about 2 weeks (around 03/10/2019) for GERD and constipation (F2F).  Wilfred Lacy, NP

## 2019-02-24 NOTE — Patient Instructions (Addendum)
Increase oral hydration. Start low FODMAP diet. Your urine culture is positive for E. Coli. Complete bactrim x 6days. Start amitiza for constipation. Stop omeprazole, and start pantoprazole.  You will be contacted to schedule appt with GI (Dr. Collene Mares).   Low-FODMAP Eating Plan  FODMAPs (fermentable oligosaccharides, disaccharides, monosaccharides, and polyols) are sugars that are hard for some people to digest. A low-FODMAP eating plan may help some people who have bowel (intestinal) diseases to manage their symptoms. This meal plan can be complicated to follow. Work with a diet and nutrition specialist (dietitian) to make a low-FODMAP eating plan that is right for you. A dietitian can make sure that you get enough nutrition from this diet. What are tips for following this plan? Reading food labels  Check labels for hidden FODMAPs such as: ? High-fructose syrup. ? Honey. ? Agave. ? Natural fruit flavors. ? Onion or garlic powder.  Choose low-FODMAP foods that contain 3-4 grams of fiber per serving.  Check food labels for serving sizes. Eat only one serving at a time to make sure FODMAP levels stay low. Meal planning  Follow a low-FODMAP eating plan for up to 6 weeks, or as told by your health care provider or dietitian.  To follow the eating plan: 1. Eliminate high-FODMAP foods from your diet completely. 2. Gradually reintroduce high-FODMAP foods into your diet one at a time. Most people should wait a few days after introducing one high-FODMAP food before they introduce the next high-FODMAP food. Your dietitian can recommend how quickly you may reintroduce foods. 3. Keep a daily record of what you eat and drink, and make note of any symptoms that you have after eating. 4. Review your daily record with a dietitian regularly. Your dietitian can help you identify which foods you can eat and which foods you should avoid. General tips  Drink enough fluid each day to keep your urine pale  yellow.  Avoid processed foods. These often have added sugar and may be high in FODMAPs.  Avoid most dairy products, whole grains, and sweeteners.  Work with a dietitian to make sure you get enough fiber in your diet. Recommended foods Grains  Gluten-free grains, such as rice, oats, buckwheat, quinoa, corn, polenta, and millet. Gluten-free pasta, bread, or cereal. Rice noodles. Corn tortillas. Vegetables  Eggplant, zucchini, cucumber, peppers, green beans, Brussels sprouts, bean sprouts, lettuce, arugula, kale, Swiss chard, spinach, collard greens, bok choy, summer squash, potato, and tomato. Limited amounts of corn, carrot, and sweet potato. Green parts of scallions. Fruits  Bananas, oranges, lemons, limes, blueberries, raspberries, strawberries, grapes, cantaloupe, honeydew melon, kiwi, papaya, passion fruit, and pineapple. Limited amounts of dried cranberries, banana chips, and shredded coconut. Dairy  Lactose-free milk, yogurt, and kefir. Lactose-free cottage cheese and ice cream. Non-dairy milks, such as almond, coconut, hemp, and rice milk. Yogurts made of non-dairy milks. Limited amounts of goat cheese, brie, mozzarella, parmesan, swiss, and other hard cheeses. Meats and other protein foods  Unseasoned beef, pork, poultry, or fish. Eggs. Berniece Salines. Tofu (firm) and tempeh. Limited amounts of nuts and seeds, such as almonds, walnuts, [place] nuts, pecans, peanuts, pumpkin seeds, chia seeds, and sunflower seeds. Fats and oils  Butter-free spreads. Vegetable oils, such as olive, canola, and sunflower oil. Seasoning and other foods  Artificial sweeteners with names that do not end in "ol" such as aspartame, saccharine, and stevia. Maple syrup, white table sugar, raw sugar, brown sugar, and molasses. Fresh basil, coriander, parsley, rosemary, and thyme. Beverages  Water and mineral water.  Sugar-sweetened soft drinks. Small amounts of orange juice or cranberry juice. Black and green tea.  Most dry wines. Coffee. This may not be a complete list of low-FODMAP foods. Talk with your dietitian for more information. Foods to avoid Grains  Wheat, including kamut, durum, and semolina. Barley and bulgur. Couscous. Wheat-based cereals. Wheat noodles, bread, crackers, and pastries. Vegetables  Chicory root, artichoke, asparagus, cabbage, snow peas, sugar snap peas, mushrooms, and cauliflower. Onions, garlic, leeks, and the white part of scallions. Fruits  Fresh, dried, and juiced forms of apple, pear, watermelon, peach, plum, cherries, apricots, blackberries, boysenberries, figs, nectarines, and mango. Avocado. Dairy  Milk, yogurt, ice cream, and soft cheese. Cream and sour cream. Milk-based sauces. Custard. Meats and other protein foods  Fried or fatty meat. Sausage. Cashews and pistachios. Soybeans, baked beans, black beans, chickpeas, kidney beans, fava beans, navy beans, lentils, and split peas. Seasoning and other foods  Any sugar-free gum or candy. Foods that contain artificial sweeteners such as sorbitol, mannitol, isomalt, or xylitol. Foods that contain honey, high-fructose corn syrup, or agave. Bouillon, vegetable stock, beef stock, and chicken stock. Garlic and onion powder. Condiments made with onion, such as hummus, chutney, pickles, relish, salad dressing, and salsa. Tomato paste. Beverages  Chicory-based drinks. Coffee substitutes. Chamomile tea. Fennel tea. Sweet or fortified wines such as port or sherry. Diet soft drinks made with isomalt, mannitol, maltitol, sorbitol, or xylitol. Apple, pear, and mango juice. Juices with high-fructose corn syrup. This may not be a complete list of high-FODMAP foods. Talk with your dietitian to discuss what dietary choices are best for you.  Summary  A low-FODMAP eating plan is a short-term diet that eliminates FODMAPs from your diet to help ease symptoms of certain bowel diseases.  The eating plan usually lasts up to 6 weeks. After  that, high-FODMAP foods are restarted gradually, one at a time, so you can find out which may be causing symptoms.  A low-FODMAP eating plan can be complicated. It is best to work with a dietitian who has experience with this type of plan. This information is not intended to replace advice given to you by your health care provider. Make sure you discuss any questions you have with your health care provider. Document Released: 01/15/2017 Document Revised: 05/03/2017 Document Reviewed: 01/15/2017 Elsevier Patient Education  2020 [employer].

## 2019-02-26 LAB — URINE CULTURE
MICRO NUMBER:: 908705
SPECIMEN QUALITY:: ADEQUATE

## 2019-02-26 MED ORDER — SULFAMETHOXAZOLE-TRIMETHOPRIM 800-160 MG PO TABS: 1.0000 | ORAL_TABLET | Freq: Two times a day (BID) | ORAL | 0 refills | Status: DC

## 2019-02-26 NOTE — Addendum Note (Signed)
Addended by: Leana Gamer on: 02/26/2019 03:39 PM   Modules accepted: Orders

## 2019-03-06 ENCOUNTER — Ambulatory Visit (INDEPENDENT_AMBULATORY_CARE_PROVIDER_SITE_OTHER): Payer: BC Managed Care – PPO | Admitting: Psychology

## 2019-03-06 DIAGNOSIS — F321 Major depressive disorder, single episode, moderate: Secondary | ICD-10-CM | POA: Diagnosis not present

## 2019-03-07 ENCOUNTER — Other Ambulatory Visit: Payer: Self-pay | Admitting: Nurse Practitioner

## 2019-03-07 DIAGNOSIS — K219 Gastro-esophageal reflux disease without esophagitis: Secondary | ICD-10-CM

## 2019-03-07 DIAGNOSIS — K5909 Other constipation: Secondary | ICD-10-CM

## 2019-03-10 ENCOUNTER — Ambulatory Visit: Payer: BC Managed Care – PPO | Admitting: Nurse Practitioner

## 2019-03-12 ENCOUNTER — Telehealth: Payer: Self-pay | Admitting: Nurse Practitioner

## 2019-03-12 NOTE — Telephone Encounter (Signed)
Pt asked for a refill for sulfamethoxazole-trimethoprim (BACTRIM DS) 800-160 MG tablet  But was sent in another refill for Protonix/ Pt called to see if that was a mistake because she already had picked up Rx for protonix / please advise and send refill for BACTRIM to  [id number] [place], [place] [phone] (Phone) [phone] (Fax)

## 2019-03-12 NOTE — Telephone Encounter (Signed)
Pt is aware, [facility] will have B[facility]rim ready for her today (they never gave her the one we sent in on 02/26/2019).

## 2019-03-16 ENCOUNTER — Other Ambulatory Visit: Payer: Self-pay | Admitting: Nurse Practitioner

## 2019-03-16 DIAGNOSIS — K219 Gastro-esophageal reflux disease without esophagitis: Secondary | ICD-10-CM

## 2019-03-18 ENCOUNTER — Encounter: Payer: Self-pay | Admitting: Nurse Practitioner

## 2019-03-18 DIAGNOSIS — K5904 Chronic idiopathic constipation: Secondary | ICD-10-CM | POA: Insufficient documentation

## 2019-03-18 NOTE — Telephone Encounter (Signed)
This encounter was created in error - please disregard.

## 2019-03-20 ENCOUNTER — Ambulatory Visit: Payer: BC Managed Care – PPO | Admitting: Psychology

## 2019-04-03 ENCOUNTER — Encounter: Payer: Self-pay | Admitting: Nurse Practitioner

## 2019-04-10 ENCOUNTER — Other Ambulatory Visit: Payer: Self-pay

## 2019-04-10 ENCOUNTER — Telehealth: Payer: Self-pay | Admitting: Nurse Practitioner

## 2019-04-10 ENCOUNTER — Ambulatory Visit (INDEPENDENT_AMBULATORY_CARE_PROVIDER_SITE_OTHER): Payer: BC Managed Care – PPO | Admitting: Nurse Practitioner

## 2019-04-10 ENCOUNTER — Encounter: Payer: Self-pay | Admitting: Nurse Practitioner

## 2019-04-10 VITALS — Ht 60.0 in

## 2019-04-10 DIAGNOSIS — L249 Irritant contact dermatitis, unspecified cause: Secondary | ICD-10-CM

## 2019-04-10 DIAGNOSIS — R8290 Unspecified abnormal findings in urine: Secondary | ICD-10-CM

## 2019-04-10 DIAGNOSIS — M25511 Pain in right shoulder: Secondary | ICD-10-CM | POA: Diagnosis not present

## 2019-04-10 DIAGNOSIS — M25512 Pain in left shoulder: Secondary | ICD-10-CM

## 2019-04-10 MED ORDER — HYDROCORTISONE 0.5 % EX CREA: 1.0000 "application " | TOPICAL_CREAM | Freq: Two times a day (BID) | CUTANEOUS | 0 refills | Status: DC

## 2019-04-10 NOTE — Patient Instructions (Addendum)
Go to lab for urine collection today.  Use hydrocortisone cream BID x 3-5days OTC for rash on forearm. Call office if any signs of infection (redness, swelling, pain)  Use tylenol 500mg  every 6hrs prn or ibuprofen 400mg  every 8hrs prn for upper back and shoulder pain. Be mindfull of proper body posture while at work. Do should, neck and upper back stretch daily after work. You make also use cold or warm compress as needed.    Neck Exercises Ask your health care provider which exercises are safe for you. Do exercises exactly as told by your health care provider and adjust them as directed. It is normal to feel mild stretching, pulling, tightness, or discomfort as you do these exercises. Stop right away if you feel sudden pain or your pain gets worse. Do not begin these exercises until told by your health care provider. Neck exercises can be important for many reasons. They can improve strength and maintain flexibility in your neck, which will help your upper back and prevent neck pain. Stretching exercises Rotation neck stretching  1. Sit in a chair or stand up. 2. Place your feet flat on the floor, shoulder width apart. 3. Slowly turn your head (rotate) to the right until a slight stretch is felt. Turn it all the way to the right so you can look over your right shoulder. Do not tilt or tip your head. 4. Hold this position for 10-30 seconds. 5. Slowly turn your head (rotate) to the left until a slight stretch is felt. Turn it all the way to the left so you can look over your left shoulder. Do not tilt or tip your head. 6. Hold this position for 10-30 seconds. Repeat __________ times. Complete this exercise __________ times a day. Neck retraction 1. Sit in a sturdy chair or stand up. 2. Look straight ahead. Do not bend your neck. 3. Use your fingers to push your chin backward (retraction). Do not bend your neck for this movement. Continue to face straight ahead. If you are doing the  exercise properly, you will feel a slight sensation in your throat and a stretch at the back of your neck. 4. Hold the stretch for 1-2 seconds. Repeat __________ times. Complete this exercise __________ times a day. Strengthening exercises Neck press 1. Lie on your back on a firm bed or on the floor with a pillow under your head. 2. Use your neck muscles to push your head down on the pillow and straighten your spine. 3. Hold the position as well as you can. Keep your head facing up (in a neutral position) and your chin tucked. 4. Slowly count to 5 while holding this position. Repeat __________ times. Complete this exercise __________ times a day. Isometrics These are exercises in which you strengthen the muscles in your neck while keeping your neck still (isometrics). 1. Sit in a supportive chair and place your hand on your forehead. 2. Keep your head and face facing straight ahead. Do not flex or extend your neck while doing isometrics. 3. Push forward with your head and neck while pushing back with your hand. Hold for 10 seconds. 4. Do the sequence again, this time putting your hand against the back of your head. Use your head and neck to push backward against the hand pressure. 5. Finally, do the same exercise on either side of your head, pushing sideways against the pressure of your hand. Repeat __________ times. Complete this exercise __________ times a day. Prone head lifts 1.  Lie face-down (prone position), resting on your elbows so that your chest and upper back are raised. 2. Start with your head facing downward, near your chest. Position your chin either on or near your chest. 3. Slowly lift your head upward. Lift until you are looking straight ahead. Then continue lifting your head as far back as you can comfortably stretch. 4. Hold your head up for 5 seconds. Then slowly lower it to your starting position. Repeat __________ times. Complete this exercise __________ times a  day. Supine head lifts 1. Lie on your back (supine position), bending your knees to point to the ceiling and keeping your feet flat on the floor. 2. Lift your head slowly off the floor, raising your chin toward your chest. 3. Hold for 5 seconds. Repeat __________ times. Complete this exercise __________ times a day. Scapular retraction 1. Stand with your arms at your sides. Look straight ahead. 2. Slowly pull both shoulders (scapulae) backward and downward (retraction) until you feel a stretch between your shoulder blades in your upper back. 3. Hold for 10-30 seconds. 4. Relax and repeat. Repeat __________ times. Complete this exercise __________ times a day. Contact a health care provider if:  Your neck pain or discomfort gets much worse when you do an exercise.  Your neck pain or discomfort does not improve within 2 hours after you exercise. If you have any of these problems, stop exercising right away. Do not do the exercises again unless your health care provider says that you can. Get help right away if:  You develop sudden, severe neck pain. If this happens, stop exercising right away. Do not do the exercises again unless your health care provider says that you can. This information is not intended to replace advice given to you by your health care provider. Make sure you discuss any questions you have with your health care provider. Document Released: 05/02/2015 Document Revised: 03/19/2018 Document Reviewed: 03/19/2018 Elsevier Patient Education  [place].   Shoulder Exercises Ask your health care provider which exercises are safe for you. Do exercises exactly as told by your health care provider and adjust them as directed. It is normal to feel mild stretching, pulling, tightness, or discomfort as you do these exercises. Stop right away if you feel sudden pain or your pain gets worse. Do not begin these exercises until told by your health care provider. Stretching  exercises External rotation and abduction This exercise is sometimes called corner stretch. This exercise rotates your arm outward (external rotation) and moves your arm out from your body (abduction). 1. Stand in a doorway with one of your feet slightly in front of the other. This is called a staggered stance. If you cannot reach your forearms to the door frame, stand facing a corner of a room. 2. Choose one of the following positions as told by your health care provider: ? Place your hands and forearms on the door frame above your head. ? Place your hands and forearms on the door frame at the height of your head. ? Place your hands on the door frame at the height of your elbows. 3. Slowly move your weight onto your front foot until you feel a stretch across your chest and in the front of your shoulders. Keep your head and chest upright and keep your abdominal muscles tight. 4. Hold for __________ seconds. 5. To release the stretch, shift your weight to your back foot. Repeat __________ times. Complete this exercise __________ times a day.  Extension, standing 1. Stand and hold a broomstick, a cane, or a similar object behind your back. ? Your hands should be a little wider than shoulder width apart. ? Your palms should face away from your back. 2. Keeping your elbows straight and your shoulder muscles relaxed, move the stick away from your body until you feel a stretch in your shoulders (extension). ? Avoid shrugging your shoulders while you move the stick. Keep your shoulder blades tucked down toward the middle of your back. 3. Hold for __________ seconds. 4. Slowly return to the starting position. Repeat __________ times. Complete this exercise __________ times a day. Range-of-motion exercises Pendulum  1. Stand near a wall or a surface that you can hold onto for balance. 2. Bend at the waist and let your left / right arm hang straight down. Use your other arm to support you. Keep your  back straight and do not lock your knees. 3. Relax your left / right arm and shoulder muscles, and move your hips and your trunk so your left / right arm swings freely. Your arm should swing because of the motion of your body, not because you are using your arm or shoulder muscles. 4. Keep moving your hips and trunk so your arm swings in the following directions, as told by your health care provider: ? Side to side. ? Forward and backward. ? In clockwise and counterclockwise circles. 5. Continue each motion for __________ seconds, or for as long as told by your health care provider. 6. Slowly return to the starting position. Repeat __________ times. Complete this exercise __________ times a day. Shoulder flexion, standing  1. Stand and hold a broomstick, a cane, or a similar object. Place your hands a little more than shoulder width apart on the object. Your left / right hand should be palm up, and your other hand should be palm down. 2. Keep your elbow straight and your shoulder muscles relaxed. Push the stick up with your healthy arm to raise your left / right arm in front of your body, and then over your head until you feel a stretch in your shoulder (flexion). ? Avoid shrugging your shoulder while you raise your arm. Keep your shoulder blade tucked down toward the middle of your back. 3. Hold for __________ seconds. 4. Slowly return to the starting position. Repeat __________ times. Complete this exercise __________ times a day. Shoulder abduction, standing 1. Stand and hold a broomstick, a cane, or a similar object. Place your hands a little more than shoulder width apart on the object. Your left / right hand should be palm up, and your other hand should be palm down. 2. Keep your elbow straight and your shoulder muscles relaxed. Push the object across your body toward your left / right side. Raise your left / right arm to the side of your body (abduction) until you feel a stretch in your  shoulder. ? Do not raise your arm above shoulder height unless your health care provider tells you to do that. ? If directed, raise your arm over your head. ? Avoid shrugging your shoulder while you raise your arm. Keep your shoulder blade tucked down toward the middle of your back. 3. Hold for __________ seconds. 4. Slowly return to the starting position. Repeat __________ times. Complete this exercise __________ times a day. Internal rotation  1. Place your left / right hand behind your back, palm up. 2. Use your other hand to dangle an exercise band, a towel, or  a similar object over your shoulder. Grasp the band with your left / right hand so you are holding on to both ends. 3. Gently pull up on the band until you feel a stretch in the front of your left / right shoulder. The movement of your arm toward the center of your body is called internal rotation. ? Avoid shrugging your shoulder while you raise your arm. Keep your shoulder blade tucked down toward the middle of your back. 4. Hold for __________ seconds. 5. Release the stretch by letting go of the band and lowering your hands. Repeat __________ times. Complete this exercise __________ times a day. Strengthening exercises External rotation  1. Sit in a stable chair without armrests. 2. Secure an exercise band to a stable object at elbow height on your left / right side. 3. Place a soft object, such as a folded towel or a small pillow, between your left / right upper arm and your body to move your elbow about 4 inches (10 cm) away from your side. 4. Hold the end of the exercise band so it is tight and there is no slack. 5. Keeping your elbow pressed against the soft object, slowly move your forearm out, away from your abdomen (external rotation). Keep your body steady so only your forearm moves. 6. Hold for __________ seconds. 7. Slowly return to the starting position. Repeat __________ times. Complete this exercise __________  times a day. Shoulder abduction  1. Sit in a stable chair without armrests, or stand up. 2. Hold a __________ weight in your left / right hand, or hold an exercise band with both hands. 3. Start with your arms straight down and your left / right palm facing in, toward your body. 4. Slowly lift your left / right hand out to your side (abduction). Do not lift your hand above shoulder height unless your health care provider tells you that this is safe. ? Keep your arms straight. ? Avoid shrugging your shoulder while you do this movement. Keep your shoulder blade tucked down toward the middle of your back. 5. Hold for __________ seconds. 6. Slowly lower your arm, and return to the starting position. Repeat __________ times. Complete this exercise __________ times a day. Shoulder extension 1. Sit in a stable chair without armrests, or stand up. 2. Secure an exercise band to a stable object in front of you so it is at shoulder height. 3. Hold one end of the exercise band in each hand. Your palms should face each other. 4. Straighten your elbows and lift your hands up to shoulder height. 5. Step back, away from the secured end of the exercise band, until the band is tight and there is no slack. 6. Squeeze your shoulder blades together as you pull your hands down to the sides of your thighs (extension). Stop when your hands are straight down by your sides. Do not let your hands go behind your body. 7. Hold for __________ seconds. 8. Slowly return to the starting position. Repeat __________ times. Complete this exercise __________ times a day. Shoulder row 1. Sit in a stable chair without armrests, or stand up. 2. Secure an exercise band to a stable object in front of you so it is at waist height. 3. Hold one end of the exercise band in each hand. Position your palms so that your thumbs are facing the ceiling (neutral position). 4. Bend each of your elbows to a 90-degree angle (right angle) and keep  your upper arms  at your sides. 5. Step back until the band is tight and there is no slack. 6. Slowly pull your elbows back behind you. 7. Hold for __________ seconds. 8. Slowly return to the starting position. Repeat __________ times. Complete this exercise __________ times a day. Shoulder press-ups  1. Sit in a stable chair that has armrests. Sit upright, with your feet flat on the floor. 2. Put your hands on the armrests so your elbows are bent and your fingers are pointing forward. Your hands should be about even with the sides of your body. 3. Push down on the armrests and use your arms to lift yourself off the chair. Straighten your elbows and lift yourself up as much as you comfortably can. ? Move your shoulder blades down, and avoid letting your shoulders move up toward your ears. ? Keep your feet on the ground. As you get stronger, your feet should support less of your body weight as you lift yourself up. 4. Hold for __________ seconds. 5. Slowly lower yourself back into the chair. Repeat __________ times. Complete this exercise __________ times a day. Wall push-ups  1. Stand so you are facing a stable wall. Your feet should be about one arm-length away from the wall. 2. Lean forward and place your palms on the wall at shoulder height. 3. Keep your feet flat on the floor as you bend your elbows and lean forward toward the wall. 4. Hold for __________ seconds. 5. Straighten your elbows to push yourself back to the starting position. Repeat __________ times. Complete this exercise __________ times a day. This information is not intended to replace advice given to you by your health care provider. Make sure you discuss any questions you have with your health care provider. Document Released: 04/04/2005 Document Revised: 09/12/2018 Document Reviewed: 06/20/2018 Elsevier Patient Education  2020 [employer].

## 2019-04-10 NOTE — Telephone Encounter (Signed)
Pt would like to schedule an appt today for a rash on her arm and shoulders pain, Can this be in office or does it need to be virtual?

## 2019-04-10 NOTE — Telephone Encounter (Signed)
Virtual visit is fine

## 2019-04-10 NOTE — Progress Notes (Signed)
Virtual Visit via Video Note  I connected with Creig Hines on 04/10/19 at 11:00 AM EST by a video enabled telemedicine application and verified that I am speaking with the correct person using two identifiers.  Location: Patient: home Provider: office   I discussed the limitations of evaluation and management by telemedicine and the availability of in person appointments. The patient expressed understanding and agreed to proceed.  CC: pt is c/o rash/bumps on left arm/ichy/used otc/notice 5 days--FYI--still strong odor in urine-- shoulders pain--painful when at works--1 wk/took tylenol and ibuprofen  History of Present Illness: Rash This is a new problem. The current episode started in the past 7 days. The affected locations include the left arm and right arm. The rash is characterized by itchiness. It is unknown if there was an exposure to a precipitant. Pertinent negatives include no fatigue, fever or joint pain. Past treatments include moisturizer. The treatment provided significant relief. Her past medical history is significant for allergies. There is no history of asthma, eczema or varicella.  Shoulder Pain  The pain is present in the neck, back, left shoulder and right shoulder. This is a new problem. The current episode started in the past 7 days. The problem occurs intermittently. The problem has been unchanged. The quality of the pain is described as aching. The pain is moderate. Associated symptoms include stiffness. Pertinent negatives include no fever, inability to bear weight, itching, joint locking, joint swelling, limited range of motion, numbness or tingling. The symptoms are aggravated by activity. She has tried NSAIDS for the symptoms. The treatment provided significant relief. Family history does not include gout or rheumatoid arthritis. There is no history of diabetes, gout, osteoarthritis or rheumatoid arthritis.  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   Reports persist foul urine odor after completion of oral abx for UTI due to E.Coli. constipation has improved with use of trulance samples from GI. No dysuria or hematuria or pelvic pain or fever  Observations/Objective: Physical Exam  Constitutional: She is oriented to person, place, and time. No distress.  Neck: Normal range of motion. Neck supple.  Respiratory: Effort normal.  Musculoskeletal: Normal range of motion.        General: No edema.  Neurological: She is alert and oriented to person, place, and time.  Skin: Skin is warm. Rash noted. Rash is papular. No erythema.       Assessment and Plan: Raynette was seen today for rash and pain.  Diagnoses and all orders for this visit:  Abnormal urine odor -     Urinalysis w microscopic + reflex cultur  Acute pain of both shoulders  Irritant contact dermatitis, unspecified trigger -     hydrocortisone cream 0.5 %; Apply 1 application topically 2 (two) times daily.   Follow Up Instructions: See avs Work note provided   I discussed the assessment and treatment plan with the patient. The patient was provided an opportunity to ask questions and all were answered. The patient agreed with the plan and demonstrated an understanding of the instructions.   The patient was advised to call back or seek an in-person evaluation if the symptoms worsen or if the condition fails to improve as anticipated.  Wilfred Lacy, NP

## 2019-04-23 ENCOUNTER — Other Ambulatory Visit: Payer: Self-pay

## 2019-04-23 DIAGNOSIS — Z20822 Contact with and (suspected) exposure to covid-19: Secondary | ICD-10-CM

## 2019-04-27 LAB — NOVEL CORONAVIRUS, NAA: SARS-CoV-2, NAA: NOT DETECTED

## 2019-06-12 ENCOUNTER — Ambulatory Visit: Payer: BC Managed Care – PPO | Admitting: Family Medicine

## 2019-06-15 ENCOUNTER — Other Ambulatory Visit (HOSPITAL_COMMUNITY)
Admission: RE | Admit: 2019-06-15 | Discharge: 2019-06-15 | Disposition: A | Discharge status: Home / Self Care | Payer: BC Managed Care – PPO | Source: Ambulatory Visit | Attending: Nurse Practitioner | Admitting: Nurse Practitioner

## 2019-06-15 ENCOUNTER — Ambulatory Visit (INDEPENDENT_AMBULATORY_CARE_PROVIDER_SITE_OTHER): Payer: BC Managed Care – PPO | Admitting: Nurse Practitioner

## 2019-06-15 ENCOUNTER — Other Ambulatory Visit: Payer: Self-pay

## 2019-06-15 ENCOUNTER — Encounter: Payer: Self-pay | Admitting: Nurse Practitioner

## 2019-06-15 VITALS — BP 104/70 | HR 78 | Temp 97.4°F | Ht 60.0 in | Wt 138.8 lb

## 2019-06-15 DIAGNOSIS — N760 Acute vaginitis: Secondary | ICD-10-CM | POA: Insufficient documentation

## 2019-06-15 MED ORDER — FLUCONAZOLE 150 MG PO TABS: 150.0000 mg | ORAL_TABLET | Freq: Once | ORAL | 0 refills | Status: AC

## 2019-06-15 NOTE — Patient Instructions (Signed)

## 2019-06-15 NOTE — Progress Notes (Addendum)
Subjective:  Patient ID: Ashley Cole, female    DOB: 09/03/1985  Age: 34 y.o. MRN: [id number]  CC: Vaginal Discharge (pt is c/o of vaginal thick discharge, irritate,dysuria 1 time,itchy/ started 1 wk/took craem otc.  FYI--maybe it is soap?)  Vaginal Discharge The patient's primary symptoms include genital itching and vaginal discharge. The patient's pertinent negatives include no genital lesions, genital odor, genital rash, missed menses, pelvic pain or vaginal bleeding. This is a new problem. The current episode started in the past 7 days. The problem occurs daily. The problem has been gradually improving. The patient is experiencing no pain. She is not pregnant. Pertinent negatives include no abdominal pain, back pain, chills, constipation, diarrhea, dysuria, fever, flank pain, frequency, hematuria, joint pain, painful intercourse, rash or urgency. The vaginal discharge was thick, mucoid and white. The vaginal bleeding is typical of menses. She has tried antifungals for the symptoms. The treatment provided mild relief. She is sexually active. No, her partner does not have an STD. Contraceptive use: husband is sterile. Her menstrual history has been regular. Her past medical history is significant for vaginosis. There is no history of menorrhagia, metrorrhagia, PID or an STD.   Reviewed past Medical, Social and Family history today.  Outpatient Medications Prior to Visit  Medication Sig Dispense Refill  . acetaminophen (TYLENOL) 500 MG tablet Take 1 tablet (500 mg total) by mouth every 6 (six) hours as needed. 30 tablet 0  . albuterol (PROVENTIL HFA;VENTOLIN HFA) 108 (90 Base) MCG/ACT inhaler Inhale 2 puffs into the lungs every 6 (six) hours as needed for wheezing or shortness of breath.    Marland Kitchen FLUoxetine (PROZAC) 20 MG capsule     . fluticasone (FLONASE) 50 MCG/ACT nasal spray Place into both nostrils daily.    . IRON PO iron    . montelukast (SINGULAIR) 10 MG tablet Take 10 mg by mouth at  bedtime.    . pantoprazole (PROTONIX) 20 MG tablet Take 1 tablet (20 mg total) by mouth daily. 90 tablet 0  . Plecanatide (TRULANCE) 3 MG TABS Take by mouth.    Marland Kitchen azelastine (OPTIVAR) 0.05 % ophthalmic solution 1 drop 2 (two) times daily as needed.    . hydrocortisone cream 0.5 % Apply 1 application topically 2 (two) times daily. (Patient not taking: Reported on 06/15/2019) 30 g 0   No facility-administered medications prior to visit.    ROS See HPI  Objective:  BP 104/70   Pulse 78   Temp (!) 97.4 F (36.3 C) (Tympanic)   Ht 5' (1.524 m)   Wt 138 lb 12.8 oz (63 kg)   LMP 05/20/2019   SpO2 99%   BMI 27.11 kg/m   BP Readings from Last 3 Encounters:  06/15/19 104/70  02/24/19 112/68  09/13/18 108/84    Wt Readings from Last 3 Encounters:  06/15/19 138 lb 12.8 oz (63 kg)  02/24/19 142 lb 3.2 oz (64.5 kg)  11/24/18 141 lb (64 kg)    Physical Exam Vitals reviewed. Exam conducted with a chaperone present.  Cardiovascular:     Rate and Rhythm: Normal rate.  Pulmonary:     Effort: Pulmonary effort is normal.  Genitourinary:    Labia:        Right: No rash, tenderness or lesion.        Left: No rash, tenderness or lesion.      Vagina: Vaginal discharge present. No erythema.     Cervix: No cervical motion tenderness, discharge, friability or erythema.  Adnexa: Right adnexa normal and left adnexa normal.  Skin:    General: Skin is warm and dry.     Findings: No erythema or rash.    Lab Results  Component Value Date   WBC 8.8 09/12/2018   HGB 11.7 (L) 09/12/2018   HCT 35.5 (L) 09/12/2018   PLT 292 09/12/2018   GLUCOSE 93 09/12/2018   CHOL 143 05/03/2016   TRIG 83.0 05/03/2016   HDL 37.90 (L) 05/03/2016   LDLCALC 89 05/03/2016   ALT 17 09/12/2018   AST 18 09/12/2018   NA 138 09/12/2018   K 4.0 09/12/2018   CL 105 09/12/2018   CREATININE 0.62 09/12/2018   BUN 6 09/12/2018   CO2 25 09/12/2018   TSH 1.13 04/17/2018    Assessment & Plan:  This visit  occurred during the SARS-CoV-2 public health emergency.  Safety protocols were in place, including screening questions prior to the visit, additional usage of staff PPE, and extensive cleaning of exam room while observing appropriate contact time as indicated for disinfecting solutions.   Ashley Cole was seen today for vaginal discharge.  Diagnoses and all orders for this visit:  Acute vaginitis -     fluconazole (DIFLUCAN) 150 MG tablet; Take 1 tablet (150 mg total) by mouth once for 1 dose. -     Cervicovaginal ancillary only -     metroNIDAZOLE (METROGEL VAGINAL) 0.75 % vaginal gel; Place 1 Applicatorful vaginally at bedtime.   I am having Ashley Cole start on fluconazole and metroNIDAZOLE. I am also having her maintain her IRON PO, fluticasone, montelukast, albuterol, azelastine, FLUoxetine, acetaminophen, pantoprazole, Trulance, and hydrocortisone cream.  Meds ordered this encounter  Medications  . fluconazole (DIFLUCAN) 150 MG tablet    Sig: Take 1 tablet (150 mg total) by mouth once for 1 dose.    Dispense:  1 tablet    Refill:  0    Order Specific Question:   Supervising Provider    Answer:   Lucille Passy [[id number]]  . metroNIDAZOLE (METROGEL VAGINAL) 0.75 % vaginal gel    Sig: Place 1 Applicatorful vaginally at bedtime.    Dispense:  70 g    Refill:  0    Order Specific Question:   Supervising Provider    Answer:   MATTHEWS, CODY [[id number]]    Problem List Items Addressed This Visit    None    Visit Diagnoses    Acute vaginitis    -  Primary   Relevant Medications   metroNIDAZOLE (METROGEL VAGINAL) 0.75 % vaginal gel   Other Relevant Orders   Cervicovaginal ancillary only (Completed)      Follow-up: Return if symptoms worsen or fail to improve.  Wilfred Lacy, NP

## 2019-06-16 LAB — CERVICOVAGINAL ANCILLARY ONLY
Bacterial Vaginitis (gardnerella): POSITIVE — AB
Candida Glabrata: NEGATIVE
Candida Vaginitis: NEGATIVE
Comment: NEGATIVE
Comment: NEGATIVE
Comment: NEGATIVE

## 2019-06-16 MED ORDER — METRONIDAZOLE 0.75 % VA GEL: 1.0000 | Freq: Every day | VAGINAL | 0 refills | Status: DC

## 2019-06-16 NOTE — Addendum Note (Signed)
Addended by: Leana Gamer on: 06/16/2019 06:04 PM   Modules accepted: Orders

## 2019-06-18 ENCOUNTER — Telehealth: Payer: Self-pay | Admitting: Nurse Practitioner

## 2019-06-18 NOTE — Telephone Encounter (Signed)
-----   Message from Shawnie Pons, LPN sent at [phone]  4:01 PM EST ----- Pt is aware, pt stated she also gets yeast infection on gel form as well and she told Baldo Ash this before. Advise the pt to let us know if she starting to experience yeast infection.

## 2019-06-18 NOTE — Telephone Encounter (Signed)
Pt is aware.  

## 2019-06-18 NOTE — Telephone Encounter (Signed)
Diflucan tab was sent during after her recent OV, so another refill is not needed at this time. Especially when her vaginal swab does not indicate the presence of yeast cells.

## 2019-07-06 ENCOUNTER — Encounter: Payer: Self-pay | Admitting: Nurse Practitioner

## 2019-07-13 ENCOUNTER — Encounter: Payer: Self-pay | Admitting: Nurse Practitioner

## 2019-07-13 ENCOUNTER — Telehealth: Payer: Self-pay | Admitting: Nurse Practitioner

## 2019-07-13 NOTE — Telephone Encounter (Signed)
Pt would like a call back from nurse as soon as available. [phone]

## 2019-07-13 NOTE — Telephone Encounter (Signed)
Pt called stating that her spouse tested positive for Covid 19 on 06/25/2019. Pt has to stay out of work and get herself tested before she cam go back to work. Pt quarantine end on 07/10/2019 and she  suppose to get test for Covid again around the 6th in order to go back to work. Pt miss understood and got tested 3 times before her quarantine over (last one was 07/07/2019--negative). Pt has to either get tested again or get her doctor to write letter for her to go back to work. Pt stated she has been out of work for 3 weeks and unable to pay for another Covid test at the moment.   Baldo Ash please advise

## 2019-07-13 NOTE — Telephone Encounter (Signed)
Have her forward copy of test results from 07/07/2019

## 2019-07-13 NOTE — Progress Notes (Signed)
Letter written She has been asymptomatic throughout her quarantine period.

## 2019-08-10 ENCOUNTER — Other Ambulatory Visit: Payer: Self-pay | Admitting: Cardiology

## 2019-08-10 DIAGNOSIS — Z20822 Contact with and (suspected) exposure to covid-19: Secondary | ICD-10-CM

## 2019-08-10 NOTE — Progress Notes (Signed)
novel 

## 2019-08-11 LAB — NOVEL CORONAVIRUS, NAA: SARS-CoV-2, NAA: DETECTED — AB

## 2019-08-13 ENCOUNTER — Encounter: Payer: Self-pay | Admitting: Nurse Practitioner

## 2019-08-13 ENCOUNTER — Telehealth (INDEPENDENT_AMBULATORY_CARE_PROVIDER_SITE_OTHER): Payer: BC Managed Care – PPO | Admitting: Nurse Practitioner

## 2019-08-13 VITALS — Temp 97.9°F | Ht 60.0 in

## 2019-08-13 DIAGNOSIS — U071 COVID-19: Secondary | ICD-10-CM | POA: Diagnosis not present

## 2019-08-13 MED ORDER — ACETAMINOPHEN 500 MG PO TABS: 500.0000 mg | ORAL_TABLET | Freq: Four times a day (QID) | ORAL | 0 refills | Status: DC | PRN

## 2019-08-13 MED ORDER — GUAIFENESIN-DM 100-10 MG/5ML PO SYRP: 15.0000 mL | ORAL_SOLUTION | Freq: Three times a day (TID) | ORAL | 0 refills | Status: DC | PRN

## 2019-08-13 NOTE — Progress Notes (Signed)
Virtual Visit via Video Note  I connected with@ on 08/13/19 at 11:00 AM EST by a video enabled telemedicine application and verified that I am speaking with the correct person using two identifiers.  Location: Patient:Home Provider: Office Participants: patient and provider   I connected with "Patient Name" today by a video enabled telemedicine application and verified that I am speaking with the correct person using two identifiers. Location patient: home Location provider: work Persons participating in the virtual visit: patient, provider  I discussed the limitations of evaluation and management by telemedicine and the availability of in person appointments. I also discussed with the patient that there may be a patient responsible charge related to this service. The patient expressed understanding and agreed to proceed.  [id number] positive on 08/10/2019  History of Present Illness: Cough This is a new problem. The current episode started in the past 7 days. The problem has been gradually improving. The cough is non-productive. Associated symptoms include headaches, nasal congestion, postnasal drip and rhinorrhea. Pertinent negatives include no chest pain, chills, ear congestion, ear pain, fever, heartburn, hemoptysis, myalgias, rash, sore throat, shortness of breath, sweats, weight loss or wheezing. Associated symptoms comments: Loss of smell. The symptoms are aggravated by lying down. She has tried OTC cough suppressant and a beta-agonist inhaler for the symptoms. The treatment provided significant relief. Her past medical history is significant for asthma and environmental allergies.   Observations/Objective: Physical Exam  Constitutional: She is oriented to person, place, and time. No distress.  Pulmonary/Chest: Effort normal.  Neurological: She is alert and oriented to person, place, and time.  Psychiatric: She has a normal mood and affect. Her behavior is normal. Thought content  normal.  Vitals reviewed.  Assessment and Plan: Lashaune was seen today for cough.  Diagnoses and all orders for this visit:  COVID-19 -     guaiFENesin-dextromethorphan (ROBITUSSIN DM) 100-10 MG/5ML syrup; Take 15 mLs by mouth every 8 (eight) hours as needed for cough. -     acetaminophen (TYLENOL) 500 MG tablet; Take 1 tablet (500 mg total) by mouth every 6 (six) hours as needed. -     MYCHART COVID-19 HOME MONITORING PROGRAM -     Temperature monitoring; Future   Follow Up Instructions: See avs   I discussed the assessment and treatment plan with the patient. The patient was provided an opportunity to ask questions and all were answered. The patient agreed with the plan and demonstrated an understanding of the instructions.   The patient was advised to call back or seek an in-person evaluation if the symptoms worsen or if the condition fails to improve as anticipated.   Wilfred Lacy, NP

## 2019-08-13 NOTE — Patient Instructions (Signed)
Person Under Monitoring Name: Ashley Cole  Location: [address] [place] [id number]   Infection Prevention Recommendations for Individuals Confirmed to have, or Being Evaluated for, 2019 Novel Coronavirus (COVID-19) Infection Who Receive Care at Home  Individuals who are confirmed to have, or are being evaluated for, COVID-19 should follow the prevention steps below until a healthcare provider or local or state health department says they can return to normal activities.  Stay home except to get medical care You should restrict activities outside your home, except for getting medical care. Do not go to work, school, or public areas, and do not use public transportation or taxis.  Call ahead before visiting your doctor Before your medical appointment, call the healthcare provider and tell them that you have, or are being evaluated for, COVID-19 infection. This will help the healthcare provider's office take steps to keep other people from getting infected. Ask your healthcare provider to call the local or state health department.  Monitor your symptoms Seek prompt medical attention if your illness is worsening (e.g., difficulty breathing). Before going to your medical appointment, call the healthcare provider and tell them that you have, or are being evaluated for, COVID-19 infection. Ask your healthcare provider to call the local or state health department.  Wear a facemask You should wear a facemask that covers your nose and mouth when you are in the same room with other people and when you visit a healthcare provider. People who live with or visit you should also wear a facemask while they are in the same room with you.  Separate yourself from other people in your home As much as possible, you should stay in a different room from other people in your home. Also, you should use a separate bathroom, if available.  Avoid sharing household items You should not  share dishes, drinking glasses, cups, eating utensils, towels, bedding, or other items with other people in your home. After using these items, you should wash them thoroughly with soap and water.  Cover your coughs and sneezes Cover your mouth and nose with a tissue when you cough or sneeze, or you can cough or sneeze into your sleeve. Throw used tissues in a lined trash can, and immediately wash your hands with soap and water for at least 20 seconds or use an alcohol-based hand rub.  Wash your [facility] your hands often and thoroughly with soap and water for at least 20 seconds. You can use an alcohol-based hand sanitizer if soap and water are not available and if your hands are not visibly dirty. Avoid touching your eyes, nose, and mouth with unwashed hands.   Prevention Steps for Caregivers and Household Members of Individuals Confirmed to have, or Being Evaluated for, COVID-19 Infection Being Cared for in the Home  If you live with, or provide care at home for, a person confirmed to have, or being evaluated for, COVID-19 infection please follow these guidelines to prevent infection:  Follow healthcare provider's instructions Make sure that you understand and can help the patient follow any healthcare provider instructions for all care.  Provide for the patient's basic needs You should help the patient with basic needs in the home and provide support for getting groceries, prescriptions, and other personal needs.  Monitor the patient's symptoms If they are getting sicker, call his or her medical provider and tell them that the patient has, or is being evaluated for, COVID-19 infection. This will help the healthcare provider's office  take steps to keep other people from getting infected. Ask the healthcare provider to call the local or state health department.  Limit the number of people who have contact with the patient  If possible, have only one caregiver for the  patient.  Other household members should stay in another home or place of residence. If this is not possible, they should stay  in another room, or be separated from the patient as much as possible. Use a separate bathroom, if available.  Restrict visitors who do not have an essential need to be in the home.  Keep older adults, very young children, and other sick people away from the patient Keep older adults, very young children, and those who have compromised immune systems or chronic health conditions away from the patient. This includes people with chronic heart, lung, or kidney conditions, diabetes, and cancer.  Ensure good ventilation Make sure that shared spaces in the home have good air flow, such as from an air conditioner or an opened window, weather permitting.  Wash your hands often  Wash your hands often and thoroughly with soap and water for at least 20 seconds. You can use an alcohol based hand sanitizer if soap and water are not available and if your hands are not visibly dirty.  Avoid touching your eyes, nose, and mouth with unwashed hands.  Use disposable paper towels to dry your hands. If not available, use dedicated cloth towels and replace them when they become wet.  Wear a facemask and gloves  Wear a disposable facemask at all times in the room and gloves when you touch or have contact with the patient's blood, body fluids, and/or secretions or excretions, such as sweat, saliva, sputum, nasal mucus, vomit, urine, or feces.  Ensure the mask fits over your nose and mouth tightly, and do not touch it during use.  Throw out disposable facemasks and gloves after using them. Do not reuse.  Wash your hands immediately after removing your facemask and gloves.  If your personal clothing becomes contaminated, carefully remove clothing and launder. Wash your hands after handling contaminated clothing.  Place all used disposable facemasks, gloves, and other waste in a lined  container before disposing them with other household waste.  Remove gloves and wash your hands immediately after handling these items.  Do not share dishes, glasses, or other household items with the patient  Avoid sharing household items. You should not share dishes, drinking glasses, cups, eating utensils, towels, bedding, or other items with a patient who is confirmed to have, or being evaluated for, COVID-19 infection.  After the person uses these items, you should wash them thoroughly with soap and water.  Wash laundry thoroughly  Immediately remove and wash clothes or bedding that have blood, body fluids, and/or secretions or excretions, such as sweat, saliva, sputum, nasal mucus, vomit, urine, or feces, on them.  Wear gloves when handling laundry from the patient.  Read and follow directions on labels of laundry or clothing items and detergent. In general, wash and dry with the warmest temperatures recommended on the label.  Clean all areas the individual has used often  Clean all touchable surfaces, such as counters, tabletops, doorknobs, bathroom fixtures, toilets, phones, keyboards, tablets, and bedside tables, every day. Also, clean any surfaces that may have blood, body fluids, and/or secretions or excretions on them.  Wear gloves when cleaning surfaces the patient has come in contact with.  Use a diluted bleach solution (e.g., dilute bleach with 1 part  bleach and 10 parts water) or a household disinfectant with a label that says EPA-registered for coronaviruses. To make a bleach solution at home, add 1 tablespoon of bleach to 1 quart (4 cups) of water. For a larger supply, add  cup of bleach to 1 gallon (16 cups) of water.  Read labels of cleaning products and follow recommendations provided on product labels. Labels contain instructions for safe and effective use of the cleaning product including precautions you should take when applying the product, such as wearing gloves or  eye protection and making sure you have good ventilation during use of the product.  Remove gloves and wash hands immediately after cleaning.  Monitor yourself for signs and symptoms of illness Caregivers and household members are considered close contacts, should monitor their health, and will be asked to limit movement outside of the home to the extent possible. Follow the monitoring steps for close contacts listed on the symptom monitoring form.   ? If you have additional questions, contact your local health department or call the epidemiologist on call at [phone] (available 24/7). ? This guidance is subject to change. For the most up-to-date guidance from [facility], please refer to their website: YouBlogs.pl

## 2019-08-16 ENCOUNTER — Emergency Department (HOSPITAL_COMMUNITY)
Admission: EM | Admit: 2019-08-16 | Discharge: 2019-08-16 | Disposition: A | Discharge status: Home / Self Care | Payer: BC Managed Care – PPO | Attending: Emergency Medicine | Admitting: Emergency Medicine

## 2019-08-16 ENCOUNTER — Other Ambulatory Visit: Payer: Self-pay

## 2019-08-16 ENCOUNTER — Encounter (HOSPITAL_COMMUNITY): Payer: Self-pay | Admitting: Emergency Medicine

## 2019-08-16 DIAGNOSIS — F17210 Nicotine dependence, cigarettes, uncomplicated: Secondary | ICD-10-CM | POA: Insufficient documentation

## 2019-08-16 DIAGNOSIS — Z79899 Other long term (current) drug therapy: Secondary | ICD-10-CM | POA: Insufficient documentation

## 2019-08-16 DIAGNOSIS — R0602 Shortness of breath: Secondary | ICD-10-CM | POA: Diagnosis present

## 2019-08-16 DIAGNOSIS — R0789 Other chest pain: Secondary | ICD-10-CM | POA: Insufficient documentation

## 2019-08-16 DIAGNOSIS — U071 COVID-19: Secondary | ICD-10-CM | POA: Insufficient documentation

## 2019-08-16 HISTORY — DX: COVID-19: U07.1

## 2019-08-16 MED ORDER — BENZONATATE 100 MG PO CAPS: 100.0000 mg | ORAL_CAPSULE | Freq: Three times a day (TID) | ORAL | 0 refills | Status: DC

## 2019-08-16 NOTE — ED Triage Notes (Signed)
C/o tightness to center of chest and SOB since last night.  COVID + on 3/8.

## 2019-08-16 NOTE — ED Notes (Signed)
Patient given discharge instructions patient verbalizes understanding. 

## 2019-08-16 NOTE — ED Provider Notes (Signed)
[facility] EMERGENCY DEPARTMENT Provider Note   CSN: [id number] Arrival date & time: 08/16/19  1129     History Chief Complaint  Patient presents with  . COVID +  . Chest Pain  . Shortness of Breath    Ashley Cole is a 34 y.o. female.  HPI   This patient is a 34 year old female, she has recently been diagnosed with COVID-19, both of her children at home have the same illness.  She has had some coughing, some sneezing, over the last couple of days has had some increasing shortness of breath and tightness in the chest.  She is checking her oxygen at home and states that it has been normal but did not sleep last night because of her anxiety which kept her up due to shortness of breath.  She denies swelling, denies hemoptysis, denies purulent sputum, denies fevers or chills or myalgias.  At this time the patient feels well.  I have reviewed the medical record and found that the patient is followed up with her family doctor and has been given medications, she is using the albuterol inhaler but states it is not helping very much.  Past Medical History:  Diagnosis Date  . Abnormal Pap smear   . Anemia   . Anxiety   . Chlamydia   . COVID-19   . GERD (gastroesophageal reflux disease)   . Headache(784.0)   . Human papilloma virus    cells removed x2  . Spinal headache   . Trichomonas   . Urinary tract infection   . Vaginal Pap smear, abnormal     Patient Active Problem List   Diagnosis Date Noted  . Chronic idiopathic constipation 03/18/2019  . Asymptomatic microscopic hematuria 02/24/2019  . SOB (shortness of breath) 09/11/2018  . Acute sinus infection 06/07/2017  . Anxious mood as adjustment reaction 05/07/2017  . Family history of cervical cancer 05/03/2016  . Family history of ovarian cancer 05/03/2016  . Anemia 05/03/2016  . GASTROESOPHAGEAL REFLUX DISEASE 07/09/2007  . ABDOMINAL PAIN 07/09/2007  . EPIGASTRIC PAIN 07/09/2007    Past Surgical  History:  Procedure Laterality Date  . DILATION AND CURETTAGE OF UTERUS    . GYNECOLOGIC CRYOSURGERY    . PILONIDAL CYST EXCISION    . THERAPEUTIC ABORTION       OB History    Gravida  7   Para  3   Term  3   Preterm  0   AB  4   Living  3     SAB  0   TAB  4   Ectopic  0   Multiple  0   Live Births  1           Family History  Problem Relation Age of Onset  . Hypertension Mother   . Fibroids Mother   . Cancer Mother        ovarian  . Cancer Maternal Grandfather        cervical  . Thyroid disease Brother     Social History   Tobacco Use  . Smoking status: Current Every Day Smoker    Packs/day: 0.25    Years: 14.00    Pack years: 3.50    Types: Cigarettes  . Smokeless tobacco: Never Used  Substance Use Topics  . Alcohol use: Yes    Comment:  socially  . Drug use: No    Home Medications Prior to Admission medications   Medication Sig Start Date End Date  Taking? Authorizing Provider  acetaminophen (TYLENOL) 500 MG tablet Take 1 tablet (500 mg total) by mouth every 6 (six) hours as needed. 08/13/19   Nche, Charlene Brooke, NP  albuterol (PROVENTIL HFA;VENTOLIN HFA) 108 (90 Base) MCG/ACT inhaler Inhale 2 puffs into the lungs every 6 (six) hours as needed for wheezing or shortness of breath.    [provider]  azelastine (OPTIVAR) 0.05 % ophthalmic solution 1 drop 2 (two) times daily as needed.    [provider]  benzonatate (TESSALON) 100 MG capsule Take 1 capsule (100 mg total) by mouth every 8 (eight) hours. 08/16/19   Noemi Chapel, MD  FLUoxetine (PROZAC) 20 MG capsule  09/08/18   [provider]  fluticasone (FLONASE) 50 MCG/ACT nasal spray Place into both nostrils daily.    [provider]  guaiFENesin-dextromethorphan (ROBITUSSIN DM) 100-10 MG/5ML syrup Take 15 mLs by mouth every 8 (eight) hours as needed for cough. 08/13/19   Nche, Charlene Brooke, NP  hydrocortisone cream 0.5 % Apply 1 application topically 2  (two) times daily. Patient not taking: Reported on 06/15/2019 04/10/19   Nche, Charlene Brooke, NP  IRON PO iron    [provider]  metroNIDAZOLE (METROGEL VAGINAL) 0.75 % vaginal gel Place 1 Applicatorful vaginally at bedtime. Patient not taking: Reported on 08/13/2019 06/16/19   Nche, Charlene Brooke, NP  montelukast (SINGULAIR) 10 MG tablet Take 10 mg by mouth at bedtime.    [provider]  pantoprazole (PROTONIX) 20 MG tablet Take 1 tablet (20 mg total) by mouth daily. 03/17/19   Nche, Charlene Brooke, NP  Plecanatide (TRULANCE) 3 MG TABS Take by mouth.    [provider]    Allergies    Patient has no known allergies.  Review of Systems   Review of Systems  All other systems reviewed and are negative.   Physical Exam Updated Vital Signs BP 114/79 (BP Location: Right Arm)   Pulse 80   Temp 99.1 F (37.3 C) (Oral)   Resp 18   LMP 07/19/2019   SpO2 100%   Physical Exam Vitals and nursing note reviewed.  Constitutional:      General: She is not in acute distress.    Appearance: She is well-developed.  HENT:     Head: Normocephalic and atraumatic.     Mouth/Throat:     Pharynx: No oropharyngeal exudate.  Eyes:     General: No scleral icterus.       Right eye: No discharge.        Left eye: No discharge.     Conjunctiva/sclera: Conjunctivae normal.     Pupils: Pupils are equal, round, and reactive to light.  Neck:     Thyroid: No thyromegaly.     Vascular: No JVD.  Cardiovascular:     Rate and Rhythm: Normal rate and regular rhythm.     Heart sounds: Normal heart sounds. No murmur. No friction rub. No gallop.   Pulmonary:     Effort: Pulmonary effort is normal. No respiratory distress.     Breath sounds: Normal breath sounds. No wheezing or rales.  Abdominal:     General: Bowel sounds are normal. There is no distension.     Palpations: Abdomen is soft. There is no mass.     Tenderness: There is no abdominal tenderness.  Musculoskeletal:         General: No tenderness. Normal range of motion.     Cervical back: Normal range of motion and neck supple.  Lymphadenopathy:  Cervical: No cervical adenopathy.  Skin:    General: Skin is warm and dry.     Findings: No erythema or rash.  Neurological:     Mental Status: She is alert.     Coordination: Coordination normal.  Psychiatric:        Behavior: Behavior normal.     ED Results / Procedures / Treatments   Labs (all labs ordered are listed, but only abnormal results are displayed) Labs Reviewed - No data to display  EKG EKG Interpretation  Date/Time:  "Sunday August 16 2019 11:32:54 EDT Ventricular Rate:  86 PR Interval:  136 QRS Duration: 86 QT Interval:  378 QTC Calculation: 452 R Axis:   85 Text Interpretation: Normal sinus rhythm Normal ECG since last tracing no significant change Confirmed by Shade Rivenbark ([id number]) on 08/16/2019 11:43:27 AM   Radiology No results found.  Procedures Procedures (including critical care time)  Medications Ordered in ED Medications - No data to display  ED Course  I have reviewed the triage vital signs and the nursing notes.  Pertinent labs & imaging results that were available during my care of the patient were reviewed by me and considered in my medical decision making (see chart for details).    MDM Rules/Calculators/A&P                      This patient's exam is totally unremarkable, very reassuring, she has a normal speech pattern, there is no tachypnea, she is feeling extremely well at this time and has a normal oxygen of 100% on room air.  I have watched her ambulate and talk on the phone without any difficulty.  She has no rales or wheezing to be concerned of.  I will offer her benzonatate for her ongoing Covid illness and have instructed her on the sometimes prolonged nature of the shortness of breath as well as the red flags including hypoxia.  She has a home pulse ox and is aware of the indications for return  I  doubt that she has a pulmonary embolism or a significant infection such as a bacterial pneumonia given her normal exam  She is not on oral contraceptives, she does smoke cigarettes and was advised against continuing this practice  Final Clinical Impression(s) / ED Diagnoses Final diagnoses:  COVID-19    Rx / DC Orders ED Discharge Orders         Ordered    benzonatate (TESSALON) 100 MG capsule  Every 8 hours     03" /14/21 1152           Noemi Chapel, MD 08/16/19 1155

## 2019-08-16 NOTE — Discharge Instructions (Addendum)
Your exam is normal, your oxygen, heart rate and blood pressure are normal.  Continue to use the albuterol inhaler as needed for shortness of breath or wheezing or tightness of the chest.  If you are having severe coughing you may try the prescription cough medicine which I have sent to your pharmacy called benzonatate, take 1 every 8 hours as needed  Emergency department for severe or worsening symptoms or oxygen below 92%

## 2019-08-24 ENCOUNTER — Telehealth: Payer: Self-pay | Admitting: Nurse Practitioner

## 2019-08-24 NOTE — Telephone Encounter (Signed)
Patient is calling and wanted to speak to someone regarding if she could go back to work from having covid. CB is [phone]

## 2019-08-25 NOTE — Telephone Encounter (Signed)
Patient is calling back regarding previous message left. CB is [phone]

## 2019-08-25 NOTE — Telephone Encounter (Signed)
Pt stated she needed a work note from doctor stating she can return to work 3/25 after having Covid.  Pt said she could get it through My Chart.

## 2019-08-26 ENCOUNTER — Encounter: Payer: Self-pay | Admitting: Nurse Practitioner

## 2019-08-26 NOTE — Telephone Encounter (Signed)
Ok to write note

## 2019-08-26 NOTE — Telephone Encounter (Signed)
Work note sent.  

## 2019-08-31 ENCOUNTER — Other Ambulatory Visit: Payer: Self-pay

## 2019-09-01 ENCOUNTER — Ambulatory Visit (INDEPENDENT_AMBULATORY_CARE_PROVIDER_SITE_OTHER): Payer: BC Managed Care – PPO | Admitting: Nurse Practitioner

## 2019-09-01 ENCOUNTER — Encounter: Payer: Self-pay | Admitting: Nurse Practitioner

## 2019-09-01 VITALS — BP 118/78 | Temp 96.4°F | Ht 60.0 in | Wt 138.0 lb

## 2019-09-01 DIAGNOSIS — R310 Gross hematuria: Secondary | ICD-10-CM | POA: Diagnosis not present

## 2019-09-01 DIAGNOSIS — R300 Dysuria: Secondary | ICD-10-CM | POA: Diagnosis not present

## 2019-09-01 LAB — POCT URINALYSIS DIPSTICK
Bilirubin, UA: NEGATIVE
Glucose, UA: NEGATIVE
Ketones, UA: NEGATIVE
Leukocytes, UA: NEGATIVE
Nitrite, UA: NEGATIVE
Protein, UA: NEGATIVE
Spec Grav, UA: >=1.03 — AB (ref 1.010–1.025)
Urobilinogen, UA: 0.2 E.U./dL
pH, UA: 6 (ref 5.0–8.0)

## 2019-09-01 MED ORDER — SULFAMETHOXAZOLE-TRIMETHOPRIM 800-160 MG PO TABS: 1.0000 | ORAL_TABLET | Freq: Two times a day (BID) | ORAL | 0 refills | Status: DC

## 2019-09-01 MED ORDER — PHENAZOPYRIDINE HCL 100 MG PO TABS: 100.0000 mg | ORAL_TABLET | Freq: Three times a day (TID) | ORAL | 0 refills | Status: DC | PRN

## 2019-09-01 NOTE — Patient Instructions (Addendum)
Start bactrim You will be contacted with urine culture.   Asymptomatic Bacteriuria  Asymptomatic bacteriuria is the presence of a large number of bacteria in the urine without the usual symptoms of burning or frequent urination. What are the causes? This condition is caused by an increase in bacteria in the urine. This increase can be caused by:  Bacteria entering the urinary tract, such as during sex.  A blockage in the urinary tract, such as from kidney stones or a tumor.  Bladder problems that prevent the bladder from emptying. What increases the risk? You are more likely to develop this condition if:  You have diabetes mellitus.  You are an elderly adult, especially if you are also in a long-term care facility.  You are pregnant and in the first trimester.  You have kidney stones.  You are female.  You have had a kidney transplant.  You have a leaky kidney tube valve (reflux).  You had a urinary catheter for a long period of time. What are the signs or symptoms? There are no symptoms of this condition. How is this diagnosed? This condition is diagnosed with a urine test. Because this condition does not cause symptoms, it is usually diagnosed when a urine sample is taken to treat or diagnose another condition, such as pregnancy or kidney problems. Most women who are in their first trimester of pregnancy are screened for asymptomatic bacteriuria. How is this treated? Usually, treatment is not needed for this condition. Treating the condition can lead to other problems, such as a yeast infection or the growth of bacteria that do not respond to treatment (antibiotic-resistant bacteria). Some people, such as pregnant women and people with kidney transplants, do need treatment with antibiotic medicines to prevent kidney infection (pyelonephritis). In pregnant women, kidney infection can lead to premature labor, fetal growth restriction, or newborn death. Follow these instructions  at home: Medicines  Take over-the-counter and prescription medicines only as told by your health care provider.  If you were prescribed an antibiotic medicine, take it as told by your health care provider. Do not stop taking the antibiotic even if you start to feel better. General instructions  Monitor your condition for any changes.  Drink enough fluid to keep your urine clear or pale yellow.  Go to the bathroom more often to keep your bladder empty.  If you are female, keep the area around your vagina and rectum clean. Wipe yourself from front to back after urinating.  Keep all follow-up visits as told by your health care provider. This is important. Contact a health care provider if:  You notice any new symptoms, such as back pain or burning while urinating. Get help right away if:  You develop signs of an infection such as: ? A burning sensation when you urinate. ? Have pain when you urinate. ? Develop an intense need to urinate. ? Urinating more frequently. ? Back pain or pelvic pain. ? Fever or chills.  You have blood in your urine.  Your urine becomes discolored or cloudy.  Your urine smells bad.  You have severe pain that cannot be controlled with medicine. Summary  Asymptomatic bacteriuria is the presence of a large number of bacteria in the urine without the usual symptoms of burning or frequent urination.  Usually, treatment is not needed for this condition. Treating the condition can lead to other problems, such as too much yeast and the growth of antibiotic-resistant bacteria.  Some people, such as pregnant women and people with  kidney transplants, do need treatment with antibiotic medicines to prevent kidney infection (pyelonephritis).  If you were prescribed an antibiotic medicine, take it as told by your health care provider. Do not stop taking the antibiotic even if you start to feel better. This information is not intended to replace advice given to you  by your health care provider. Make sure you discuss any questions you have with your health care provider. Document Revised: 09/09/2018 Document Reviewed: 05/15/2016 Elsevier Patient Education  [employer].

## 2019-09-01 NOTE — Progress Notes (Signed)
Subjective:  Patient ID: Ashley Cole, female    DOB: May 20, 1986  Age: 34 y.o. MRN: [id number]  CC: Urinary Tract Infection (pt has had pain in lower pelvis and back but no burning//pt has a urge to urinate an it started last Wednesday but getting better//pt took cranberry pills and hydrated well)  Urinary Tract Infection  This is a recurrent problem. The current episode started in the past 7 days. The problem occurs every urination. The problem has been gradually improving. The quality of the pain is described as burning. The patient is experiencing no pain. There has been no fever. She is sexually active. There is no history of pyelonephritis. Associated symptoms include frequency, hematuria and urgency. Pertinent negatives include no chills, discharge, flank pain, hesitancy, nausea, possible pregnancy, sweats or vomiting. She has tried home medications and increased fluids for the symptoms. The treatment provided mild relief. Her past medical history is significant for recurrent UTIs and urinary stasis. There is no history of catheterization, kidney stones, a single kidney or a urological procedure.    Reviewed past Medical, Social and Family history today.  Outpatient Medications Prior to Visit  Medication Sig Dispense Refill  . acetaminophen (TYLENOL) 500 MG tablet Take 1 tablet (500 mg total) by mouth every 6 (six) hours as needed. 30 tablet 0  . albuterol (PROVENTIL HFA;VENTOLIN HFA) 108 (90 Base) MCG/ACT inhaler Inhale 2 puffs into the lungs every 6 (six) hours as needed for wheezing or shortness of breath.    Marland Kitchen azelastine (OPTIVAR) 0.05 % ophthalmic solution 1 drop 2 (two) times daily as needed.    . fluticasone (FLONASE) 50 MCG/ACT nasal spray Place into both nostrils daily.    . IRON PO iron    . montelukast (SINGULAIR) 10 MG tablet Take 10 mg by mouth at bedtime.    . pantoprazole (PROTONIX) 20 MG tablet Take 1 tablet (20 mg total) by mouth daily. 90 tablet 0  . benzonatate  (TESSALON) 100 MG capsule Take 1 capsule (100 mg total) by mouth every 8 (eight) hours. 21 capsule 0  . guaiFENesin-dextromethorphan (ROBITUSSIN DM) 100-10 MG/5ML syrup Take 15 mLs by mouth every 8 (eight) hours as needed for cough. 118 mL 0  . Plecanatide (TRULANCE) 3 MG TABS Take by mouth.    Marland Kitchen FLUoxetine (PROZAC) 20 MG capsule     . hydrocortisone cream 0.5 % Apply 1 application topically 2 (two) times daily. (Patient not taking: Reported on 06/15/2019) 30 g 0  . metroNIDAZOLE (METROGEL VAGINAL) 0.75 % vaginal gel Place 1 Applicatorful vaginally at bedtime. (Patient not taking: Reported on 08/13/2019) 70 g 0   No facility-administered medications prior to visit.   ROS See HPI  Objective:  BP 118/78   Temp (!) 96.4 F (35.8 C) (Tympanic)   Ht 5' (1.524 m)   Wt 138 lb (62.6 kg)   SpO2 97%   BMI 26.95 kg/m   BP Readings from Last 3 Encounters:  09/01/19 118/78  08/16/19 114/79  06/15/19 104/70   Wt Readings from Last 3 Encounters:  09/01/19 138 lb (62.6 kg)  06/15/19 138 lb 12.8 oz (63 kg)  02/24/19 142 lb 3.2 oz (64.5 kg)    Physical Exam Cardiovascular:     Rate and Rhythm: Normal rate.     Pulses: Normal pulses.  Pulmonary:     Effort: Pulmonary effort is normal.  Abdominal:     General: There is no distension.     Palpations: There is no mass.  Tenderness: There is abdominal tenderness. There is no right CVA tenderness, left CVA tenderness, guarding or rebound.     Hernia: No hernia is present.  Neurological:     Mental Status: She is alert and oriented to person, place, and time.    Lab Results  Component Value Date   WBC 8.8 09/12/2018   HGB 11.7 (L) 09/12/2018   HCT 35.5 (L) 09/12/2018   PLT 292 09/12/2018   GLUCOSE 93 09/12/2018   CHOL 143 05/03/2016   TRIG 83.0 05/03/2016   HDL 37.90 (L) 05/03/2016   LDLCALC 89 05/03/2016   ALT 17 09/12/2018   AST 18 09/12/2018   NA 138 09/12/2018   K 4.0 09/12/2018   CL 105 09/12/2018   CREATININE 0.62  09/12/2018   BUN 6 09/12/2018   CO2 25 09/12/2018   TSH 1.13 04/17/2018    Assessment & Plan:  This visit occurred during the SARS-CoV-2 public health emergency.  Safety protocols were in place, including screening questions prior to the visit, additional usage of staff PPE, and extensive cleaning of exam room while observing appropriate contact time as indicated for disinfecting solutions.   Ashley Cole was seen today for urinary tract infection.  Diagnoses and all orders for this visit:  Dysuria -     Urine Culture -     POCT urinalysis dipstick -     sulfamethoxazole-trimethoprim (BACTRIM DS) 800-160 MG tablet; Take 1 tablet by mouth 2 (two) times daily. -     phenazopyridine (PYRIDIUM) 100 MG tablet; Take 1 tablet (100 mg total) by mouth 3 (three) times daily as needed for pain (with food).  Gross hematuria   I have discontinued Caryl Pina L. Schliep's Trulance, hydrocortisone cream, metroNIDAZOLE, guaiFENesin-dextromethorphan, and benzonatate. I am also having her start on sulfamethoxazole-trimethoprim and phenazopyridine. Additionally, I am having her maintain her IRON PO, fluticasone, montelukast, albuterol, azelastine, FLUoxetine, pantoprazole, and acetaminophen.  Meds ordered this encounter  Medications  . sulfamethoxazole-trimethoprim (BACTRIM DS) 800-160 MG tablet    Sig: Take 1 tablet by mouth 2 (two) times daily.    Dispense:  6 tablet    Refill:  0    Order Specific Question:   Supervising Provider    Answer:   Ronnald Nian [id number]  . phenazopyridine (PYRIDIUM) 100 MG tablet    Sig: Take 1 tablet (100 mg total) by mouth 3 (three) times daily as needed for pain (with food).    Dispense:  10 tablet    Refill:  0    Order Specific Question:   Supervising Provider    Answer:   Ronnald Nian [id number]    Problem List Items Addressed This Visit    None    Visit Diagnoses    Dysuria    -  Primary   Relevant Medications   sulfamethoxazole-trimethoprim (BACTRIM  DS) 800-160 MG tablet   phenazopyridine (PYRIDIUM) 100 MG tablet   Other Relevant Orders   Urine Culture   POCT urinalysis dipstick (Completed)   Gross hematuria          Follow-up: No follow-ups on file.  Wilfred Lacy, NP

## 2019-09-03 LAB — URINE CULTURE
MICRO NUMBER:: 10307672
Result:: NO GROWTH
SPECIMEN QUALITY:: ADEQUATE

## 2019-09-23 ENCOUNTER — Telehealth (INDEPENDENT_AMBULATORY_CARE_PROVIDER_SITE_OTHER): Payer: BC Managed Care – PPO | Admitting: Family Medicine

## 2019-09-23 ENCOUNTER — Encounter: Payer: Self-pay | Admitting: Family Medicine

## 2019-09-23 DIAGNOSIS — Z7189 Other specified counseling: Secondary | ICD-10-CM | POA: Diagnosis not present

## 2019-09-23 DIAGNOSIS — Z20822 Contact with and (suspected) exposure to covid-19: Secondary | ICD-10-CM | POA: Diagnosis not present

## 2019-09-23 DIAGNOSIS — J069 Acute upper respiratory infection, unspecified: Secondary | ICD-10-CM

## 2019-09-23 NOTE — Progress Notes (Signed)
Virtual Visit via Video Note  I connected with Ashley Cole on 09/23/19 at  3:30 PM EDT by a video enabled telemedicine application 2/2 [id number] pandemic and verified that I am speaking with the correct person using two identifiers.  Location patient: home Location provider:work or home office Persons participating in the virtual visit: patient, provider  I discussed the limitations of evaluation and management by telemedicine and the availability of in person appointments. The patient expressed understanding and agreed to proceed.   HPI: Pt is a 34 yo female with pmh sig for GERD, chronic constipation, microscopic hematuria, and anxiety seen by Wilfred Lacy, NP who was seen for acute concern.  Pt states she had COVID-19 on 08/10/19.  She stayed out of work x 2 wks.  This wknd she developed fever 101 F, sore throat, cough, muscle aches, nasal congestion > at night, eyes feel sore, fatigue, chills, decreased appetite.  Denies n/v, smell.  Feels like she has COVID again, but worse.  Did not go to work on Monday.  Pt got tested on Tuesday at [facility], but is waiting on the results.  Denies sick contacts.  Taking Tylenol, nasal spray, inhaler, and theraflu.     ROS: See pertinent positives and negatives per HPI.  Past Medical History:  Diagnosis Date  . Abnormal Pap smear   . Anemia   . Anxiety   . Chlamydia   . COVID-19   . GERD (gastroesophageal reflux disease)   . Headache(784.0)   . Human papilloma virus    cells removed x2  . Spinal headache   . Trichomonas   . Urinary tract infection   . Vaginal Pap smear, abnormal     Past Surgical History:  Procedure Laterality Date  . DILATION AND CURETTAGE OF UTERUS    . GYNECOLOGIC CRYOSURGERY    . PILONIDAL CYST EXCISION    . THERAPEUTIC ABORTION      Family History  Problem Relation Age of Onset  . Hypertension Mother   . Fibroids Mother   . Cancer Mother        ovarian  . Cancer Maternal Grandfather        cervical  . Thyroid  disease Brother      Current Outpatient Medications:  .  acetaminophen (TYLENOL) 500 MG tablet, Take 1 tablet (500 mg total) by mouth every 6 (six) hours as needed., Disp: 30 tablet, Rfl: 0 .  albuterol (PROVENTIL HFA;VENTOLIN HFA) 108 (90 Base) MCG/ACT inhaler, Inhale 2 puffs into the lungs every 6 (six) hours as needed for wheezing or shortness of breath., Disp: , Rfl:  .  azelastine (OPTIVAR) 0.05 % ophthalmic solution, 1 drop 2 (two) times daily as needed., Disp: , Rfl:  .  FLUoxetine (PROZAC) 20 MG capsule, , Disp: , Rfl:  .  fluticasone (FLONASE) 50 MCG/ACT nasal spray, Place into both nostrils daily., Disp: , Rfl:  .  IRON PO, iron, Disp: , Rfl:  .  montelukast (SINGULAIR) 10 MG tablet, Take 10 mg by mouth at bedtime., Disp: , Rfl:  .  pantoprazole (PROTONIX) 20 MG tablet, Take 1 tablet (20 mg total) by mouth daily., Disp: 90 tablet, Rfl: 0 .  phenazopyridine (PYRIDIUM) 100 MG tablet, Take 1 tablet (100 mg total) by mouth 3 (three) times daily as needed for pain (with food)., Disp: 10 tablet, Rfl: 0 .  sulfamethoxazole-trimethoprim (BACTRIM DS) 800-160 MG tablet, Take 1 tablet by mouth 2 (two) times daily., Disp: 6 tablet, Rfl: 0  EXAM:  VITALS per  patient if applicable:  RR between 12-20 bpm.  Temp 99 F  GENERAL: alert, oriented, appears well and in no acute distress  HEENT: atraumatic, conjunctiva clear, no obvious abnormalities on inspection of external nose and ears  NECK: normal movements of the head and neck  LUNGS: on inspection no signs of respiratory distress, breathing rate appears normal, no obvious gross SOB, gasping or wheezing  CV: no obvious cyanosis  MS: moves all visible extremities without noticeable abnormality  PSYCH/NEURO: pleasant and cooperative, no obvious depression or anxiety, speech and thought processing grossly intact  ASSESSMENT AND PLAN:  Discussed the following assessment and plan:  Viral upper respiratory tract infection -discussed  possible causes including allergies, influenza, cannot r/o COVID. -awaiting COVID testing results. -continue supportive care. Tylenol, rest, hydration, OTC cough med, etc. -continue self quarantine -given precautions  Educated about COVID-19 virus infection -discussed s/s of COVID -awaiting test results. -discussed potential of COVID ab infusion if testing is positive.  Pt declines. -given precautions  F/u prn  I discussed the assessment and treatment plan with the patient. The patient was provided an opportunity to ask questions and all were answered. The patient agreed with the plan and demonstrated an understanding of the instructions.   The patient was advised to call back or seek an in-person evaluation if the symptoms worsen or if the condition fails to improve as anticipated.  I provided 17 minutes of non-face-to-face time during this encounter.   Billie Ruddy, MD

## 2019-09-24 ENCOUNTER — Telehealth: Payer: Self-pay | Admitting: Nurse Practitioner

## 2019-09-24 NOTE — Telephone Encounter (Signed)
I can only provide note from the day she was seen in office

## 2019-09-24 NOTE — Telephone Encounter (Signed)
Pt saw Dr. Volanda Napoleon on 09/23/19 she received her covid results and they were negative. She is requesting a work note for this past week and then indicating it says she can return on Monday.   Note can be done with mychart. Please follow up with pt.

## 2019-09-24 NOTE — Telephone Encounter (Signed)
Pt stated she saw Dr. Volanda Napoleon on 09/23/2019 for covid like symptoms--see visit note---pt request out of work note from 09/21/2019-? Due to her being sick.   Pt stated that she was advise to contact [facility] to get this since we are the PCP. Dr. Volanda Napoleon is unable to do so.   [place] please advise, pt does not want to lose her job.

## 2019-09-24 NOTE — Telephone Encounter (Signed)
Ok for work note? 

## 2019-09-24 NOTE — Telephone Encounter (Signed)
Pt called and said she had appt with [facility] yesteday due to no spots being available and they had her go get tested for covid and it came back negative at which they told her if it did she probably had the flu, She said she needs a note for work and was told by the other office that she would need to contact her pcp to get done, please advise. She also wants to know what she should do next

## 2019-09-24 NOTE — Telephone Encounter (Signed)
Unable to leave vm, need to inform pt that Ashley Cole can only give her from her appt date up to 09/25/2019.

## 2019-09-25 NOTE — Telephone Encounter (Signed)
Unable to leave vm again.

## 2019-09-25 NOTE — Telephone Encounter (Signed)
Please review previous telephone about work note request. Thank you

## 2019-09-25 NOTE — Telephone Encounter (Signed)
Letter sent to pt through MyChart portal, pt is aware

## 2019-10-19 ENCOUNTER — Telehealth: Payer: BC Managed Care – PPO | Admitting: Family Medicine

## 2019-12-17 ENCOUNTER — Ambulatory Visit (INDEPENDENT_AMBULATORY_CARE_PROVIDER_SITE_OTHER): Payer: BC Managed Care – PPO | Admitting: Family

## 2019-12-17 ENCOUNTER — Ambulatory Visit (INDEPENDENT_AMBULATORY_CARE_PROVIDER_SITE_OTHER): Payer: BC Managed Care – PPO

## 2019-12-17 ENCOUNTER — Encounter: Payer: Self-pay | Admitting: Family

## 2019-12-17 ENCOUNTER — Other Ambulatory Visit: Payer: Self-pay

## 2019-12-17 VITALS — BP 118/64 | HR 89 | Temp 97.4°F | Ht 60.0 in | Wt 139.8 lb

## 2019-12-17 DIAGNOSIS — F17290 Nicotine dependence, other tobacco product, uncomplicated: Secondary | ICD-10-CM | POA: Diagnosis not present

## 2019-12-17 DIAGNOSIS — R079 Chest pain, unspecified: Secondary | ICD-10-CM | POA: Diagnosis not present

## 2019-12-17 DIAGNOSIS — F419 Anxiety disorder, unspecified: Secondary | ICD-10-CM | POA: Diagnosis not present

## 2019-12-17 NOTE — Patient Instructions (Signed)

## 2019-12-18 ENCOUNTER — Encounter: Payer: Self-pay | Admitting: Family

## 2019-12-18 LAB — CBC WITH DIFFERENTIAL/PLATELET
Basophils Absolute: 0.1 10*3/uL (ref 0.0–0.1)
Basophils Relative: 1 % (ref 0.0–3.0)
Eosinophils Absolute: 0.1 10*3/uL (ref 0.0–0.7)
Eosinophils Relative: 1.4 % (ref 0.0–5.0)
HCT: 33.2 % — ABNORMAL LOW (ref 36.0–46.0)
Hemoglobin: 11.1 g/dL — ABNORMAL LOW (ref 12.0–15.0)
Lymphocytes Relative: 35.6 % (ref 12.0–46.0)
Lymphs Abs: 2.2 10*3/uL (ref 0.7–4.0)
MCHC: 33.6 g/dL (ref 30.0–36.0)
MCV: 88.5 fl (ref 78.0–100.0)
Monocytes Absolute: 0.5 10*3/uL (ref 0.1–1.0)
Monocytes Relative: 8.4 % (ref 3.0–12.0)
Neutro Abs: 3.4 10*3/uL (ref 1.4–7.7)
Neutrophils Relative %: 53.6 % (ref 43.0–77.0)
Platelets: 288 10*3/uL (ref 150.0–400.0)
RBC: 3.75 Mil/uL — ABNORMAL LOW (ref 3.87–5.11)
RDW: 13.8 % (ref 11.5–15.5)
WBC: 6.3 10*3/uL (ref 4.0–10.5)

## 2019-12-18 LAB — COMPREHENSIVE METABOLIC PANEL
ALT: 17 U/L (ref 0–35)
AST: 19 U/L (ref 0–37)
Albumin: 4.7 g/dL (ref 3.5–5.2)
Alkaline Phosphatase: 46 U/L (ref 39–117)
BUN: 9 mg/dL (ref 6–23)
CO2: 26 mEq/L (ref 19–32)
Calcium: 9.4 mg/dL (ref 8.4–10.5)
Chloride: 104 mEq/L (ref 96–112)
Creatinine, Ser: 0.7 mg/dL (ref 0.40–1.20)
GFR: 115.76 mL/min (ref >60.00)
Glucose, Bld: 85 mg/dL (ref 70–99)
Potassium: 4.4 mEq/L (ref 3.5–5.1)
Sodium: 137 mEq/L (ref 135–145)
Total Bilirubin: 0.2 mg/dL (ref 0.2–1.2)
Total Protein: 7.6 g/dL (ref 6.0–8.3)

## 2019-12-18 LAB — THYROID PANEL WITH TSH
Free Thyroxine Index: 2 (ref 1.4–3.8)
T3 Uptake: 29 % (ref 22–35)
T4, Total: 6.8 ug/dL (ref 5.1–11.9)
TSH: 0.94 mIU/L

## 2019-12-18 NOTE — Progress Notes (Signed)
Acute Office Visit  Subjective:    Patient ID: Ashley Cole, female    DOB: Jun 26, 1985, 34 y.o.   MRN: [id number]  Chief Complaint  Patient presents with  . Pain    under both breast-pt stated right side has been swollen for some time and the left one started having sharp pains that comes and goes since yesterday that caused her to miss work today//pt took acid reflux medication but didn't help//pt stated shes a smoker for 19 years and worried about something wrong with her lungs//pt mentioned she has a hard time breathing when she lays down she has to prop herself up    HPI Patient is in today with c/o pain and swelling on her right side x 1 year. She reports being seen for this issue in the past but nothing was founded. Pain was 6/10, worse with movement.She feels like she has swelling on her right side. Denies any injury  Patient reports high levels of stress. 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 Her boyfriend lost his job and she has not been able to get to her therapist appointments because she works more. She also has not been able to take her anxiety medication.   Past Medical History:  Diagnosis Date  . Abnormal Pap smear   . Anemia   . Anxiety   . Chlamydia   . COVID-19   . GERD (gastroesophageal reflux disease)   . Headache(784.0)   . Human papilloma virus    cells removed x2  . Spinal headache   . Trichomonas   . Urinary tract infection   . Vaginal Pap smear, abnormal     Past Surgical History:  Procedure Laterality Date  . DILATION AND CURETTAGE OF UTERUS    . GYNECOLOGIC CRYOSURGERY    . PILONIDAL CYST EXCISION    . THERAPEUTIC ABORTION      Family History  Problem Relation Age of Onset  . Hypertension Mother   . Fibroids Mother   . Cancer Mother        ovarian  . Cancer Maternal Grandfather        cervical  . Thyroid disease Brother     Social History   Socioeconomic History  . Marital status: Married    Spouse name: Not on file    . Number of children: Not on file  . Years of education: Not on file  . Highest education level: Not on file  Occupational History  . Not on file  Tobacco Use  . Smoking status: Current Every Day Smoker    Packs/day: 0.25    Years: 14.00    Pack years: 3.50    Types: Cigarettes  . Smokeless tobacco: Never Used  Vaping Use  . Vaping Use: Never used  Substance and Sexual Activity  . Alcohol use: Yes    Comment:  socially  . Drug use: No  . Sexual activity: Yes    Birth control/protection: None    Comment: husband is sterile  Other Topics Concern  . Not on file  Social History Narrative  . Not on file   Social Determinants of Health   Financial Resource Strain:   . Difficulty of Paying Living Expenses:   Food Insecurity:   . Worried About Charity fundraiser in the Last Year:   . Arboriculturist in the Last Year:   Transportation Needs:   . Film/video editor (Medical):   Marland Kitchen Lack of Transportation (Non-Medical):  Physical Activity:   . Days of Exercise per Week:   . Minutes of Exercise per Session:   Stress:   . Feeling of Stress :   Social Connections:   . Frequency of Communication with Friends and Family:   . Frequency of Social Gatherings with Friends and Family:   . Attends Religious Services:   . Active Member of Clubs or Organizations:   . Attends Club or Organization Meetings:   . Marital Status:   Intimate Partner Violence:   . Fear of Current or Ex-Partner:   . Emotionally Abused:   . Physically Abused:   . Sexually Abused:     Outpatient Medications Prior to Visit  Medication Sig Dispense Refill  . acetaminophen (TYLENOL) 500 MG tablet Take 1 tablet (500 mg total) by mouth every 6 (six) hours as needed. 30 tablet 0  . albuterol (PROVENTIL HFA;VENTOLIN HFA) 108 (90 Base) MCG/ACT inhaler Inhale 2 puffs into the lungs every 6 (six) hours as needed for wheezing or shortness of breath.    . IRON PO iron    . pantoprazole (PROTONIX) 20 MG tablet  Take 1 tablet (20 mg total) by mouth daily. 90 tablet 0  . azelastine (OPTIVAR) 0.05 % ophthalmic solution 1 drop 2 (two) times daily as needed. (Patient not taking: Reported on 12/17/2019)    . FLUoxetine (PROZAC) 20 MG capsule  (Patient not taking: Reported on 12/17/2019)    . fluticasone (FLONASE) 50 MCG/ACT nasal spray Place into both nostrils daily. (Patient not taking: Reported on 12/17/2019)    . montelukast (SINGULAIR) 10 MG tablet Take 10 mg by mouth at bedtime. (Patient not taking: Reported on 12/17/2019)    . phenazopyridine (PYRIDIUM) 100 MG tablet Take 1 tablet (100 mg total) by mouth 3 (three) times daily as needed for pain (with food). (Patient not taking: Reported on 12/17/2019) 10 tablet 0  . sulfamethoxazole-trimethoprim (BACTRIM DS) 800-160 MG tablet Take 1 tablet by mouth 2 (two) times daily. (Patient not taking: Reported on 12/17/2019) 6 tablet 0   No facility-administered medications prior to visit.    No Known Allergies  Review of Systems  HENT: Negative.   Respiratory: Negative.   Cardiovascular: Positive for chest pain. Negative for palpitations and leg swelling.  Endocrine: Negative.   Musculoskeletal: Negative.   Skin: Negative.   Neurological: Negative.   Hematological: Negative.   Psychiatric/Behavioral: Negative.   All other systems reviewed and are negative.      Objective:    Physical Exam Constitutional:      Appearance: Normal appearance. She is normal weight.  HENT:     Right Ear: Tympanic membrane normal.     Left Ear: Tympanic membrane normal.  Cardiovascular:     Rate and Rhythm: Normal rate and regular rhythm.  Pulmonary:     Effort: Pulmonary effort is normal.     Breath sounds: Normal breath sounds.  Chest:     Chest wall: No mass, deformity, swelling, tenderness or edema.     Breasts:        Right: Normal. No swelling, inverted nipple, nipple discharge or skin change.        Left: Normal. No inverted nipple, nipple discharge or skin  change.  Abdominal:     General: Abdomen is flat.     Palpations: Abdomen is soft.  Musculoskeletal:        General: Normal range of motion.     Cervical back: Normal range of motion and neck supple.  Skin:      General: Skin is warm and dry.  Neurological:     Mental Status: She is alert.     BP 118/64   Pulse 89   Temp (!) 97.4 F (36.3 C) (Tympanic)   Ht 5' (1.524 m)   Wt 139 lb 12.8 oz (63.4 kg)   SpO2 99%   BMI 27.30 kg/m  Wt Readings from Last 3 Encounters:  12/17/19 139 lb 12.8 oz (63.4 kg)  09/01/19 138 lb (62.6 kg)  06/15/19 138 lb 12.8 oz (63 kg)    Health Maintenance Due  Topic Date Due  . Hepatitis C Screening  Never done  . COVID-19 Vaccine (1) Never done  . PAP SMEAR-Modifier  08/07/2019    There are no preventive care reminders to display for this patient.   Lab Results  Component Value Date   TSH 0.94 12/17/2019   Lab Results  Component Value Date   WBC 6.3 12/17/2019   HGB 11.1 (L) 12/17/2019   HCT 33.2 (L) 12/17/2019   MCV 88.5 12/17/2019   PLT 288.0 12/17/2019   Lab Results  Component Value Date   NA 137 12/17/2019   K 4.4 12/17/2019   CO2 26 12/17/2019   GLUCOSE 85 12/17/2019   BUN 9 12/17/2019   CREATININE 0.70 12/17/2019   BILITOT 0.2 12/17/2019   ALKPHOS 46 12/17/2019   AST 19 12/17/2019   ALT 17 12/17/2019   PROT 7.6 12/17/2019   ALBUMIN 4.7 12/17/2019   CALCIUM 9.4 12/17/2019   ANIONGAP 8 09/12/2018   GFR 115.76 12/17/2019   Lab Results  Component Value Date   CHOL 143 05/03/2016   Lab Results  Component Value Date   HDL 37.90 (L) 05/03/2016   Lab Results  Component Value Date   LDLCALC 89 05/03/2016   Lab Results  Component Value Date   TRIG 83.0 05/03/2016   Lab Results  Component Value Date   CHOLHDL 4 05/03/2016   No results found for: HGBA1C     Assessment & Plan:   Problem List Items Addressed This Visit    None    Visit Diagnoses    Chest pain, unspecified type    -  Primary   Relevant  Orders   DG Chest 2 View (Completed)   Comp Met (CMET) (Completed)   CBC w/Diff (Completed)   Thyroid Panel With TSH (Completed)   Cigar smoker       Relevant Orders   DG Chest 2 View (Completed)   Comp Met (CMET) (Completed)   CBC w/Diff (Completed)   Thyroid Panel With TSH (Completed)   Anxiety       Relevant Orders   Comp Met (CMET) (Completed)   CBC w/Diff (Completed)   Thyroid Panel With TSH (Completed)      If all labs are normal,onsider treating anxiety. I think this is the cause for her symptoms.   No orders of the defined types were placed in this encounter.     B , FNP 

## 2020-01-19 ENCOUNTER — Telehealth: Payer: Self-pay | Admitting: Nurse Practitioner

## 2020-01-19 NOTE — Telephone Encounter (Signed)
Patient called and stated that she was experiencing chest pain on her right side with pain shooting into her back. Transferred caller to the nurse triage for further evaluation.

## 2020-01-20 ENCOUNTER — Other Ambulatory Visit: Payer: BC Managed Care – PPO

## 2020-01-20 ENCOUNTER — Other Ambulatory Visit: Payer: Self-pay | Admitting: Critical Care Medicine

## 2020-01-20 DIAGNOSIS — Z20822 Contact with and (suspected) exposure to covid-19: Secondary | ICD-10-CM

## 2020-01-22 ENCOUNTER — Other Ambulatory Visit: Payer: BC Managed Care – PPO

## 2020-01-22 ENCOUNTER — Telehealth: Payer: Self-pay | Admitting: Nurse Practitioner

## 2020-01-22 LAB — SARS-COV-2, NAA 2 DAY TAT

## 2020-01-22 LAB — NOVEL CORONAVIRUS, NAA: SARS-CoV-2, NAA: NOT DETECTED

## 2020-01-22 NOTE — Telephone Encounter (Signed)
Pt aware covid lab test negative, covid not detected 

## 2020-03-06 ENCOUNTER — Emergency Department (HOSPITAL_COMMUNITY)
Admission: EM | Admit: 2020-03-06 | Discharge: 2020-03-06 | Disposition: A | Discharge status: Home / Self Care | Payer: BC Managed Care – PPO | Attending: Emergency Medicine | Admitting: Emergency Medicine

## 2020-03-06 ENCOUNTER — Other Ambulatory Visit: Payer: Self-pay

## 2020-03-06 DIAGNOSIS — F17210 Nicotine dependence, cigarettes, uncomplicated: Secondary | ICD-10-CM | POA: Diagnosis not present

## 2020-03-06 DIAGNOSIS — W228XXA Striking against or struck by other objects, initial encounter: Secondary | ICD-10-CM | POA: Insufficient documentation

## 2020-03-06 DIAGNOSIS — S0990XA Unspecified injury of head, initial encounter: Secondary | ICD-10-CM | POA: Diagnosis present

## 2020-03-06 DIAGNOSIS — Y92094 Garage of other non-institutional residence as the place of occurrence of the external cause: Secondary | ICD-10-CM | POA: Diagnosis not present

## 2020-03-06 DIAGNOSIS — S0101XA Laceration without foreign body of scalp, initial encounter: Secondary | ICD-10-CM | POA: Diagnosis not present

## 2020-03-06 DIAGNOSIS — B373 Candidiasis of vulva and vagina: Secondary | ICD-10-CM | POA: Insufficient documentation

## 2020-03-06 DIAGNOSIS — Z8616 Personal history of COVID-19: Secondary | ICD-10-CM | POA: Insufficient documentation

## 2020-03-06 DIAGNOSIS — B3731 Acute candidiasis of vulva and vagina: Secondary | ICD-10-CM

## 2020-03-06 MED ORDER — FLUCONAZOLE 150 MG PO TABS
150.0000 mg | ORAL_TABLET | Freq: Every day | ORAL | 1 refills | Status: DC
Start: 2020-03-06 — End: 2021-02-11

## 2020-03-06 NOTE — Discharge Instructions (Signed)
Return if any problems.

## 2020-03-06 NOTE — ED Triage Notes (Signed)
Patient reports to the ER for head injury in the scalp that she reports needs stitches/stables. Patient reports she hurt her head around midnight. Patient reports she also has a known yeast infection and needs some medication

## 2020-03-06 NOTE — ED Provider Notes (Signed)
[facility] DEPT Provider Note   CSN: [id number] Arrival date & time: 03/06/20  1047     History Chief Complaint  Patient presents with   Head Injury   Vaginitis    Ashley Cole is a 34 y.o. female.  The history is provided by the patient. No language interpreter was used.  Head Injury Location:  R parietal Time since incident:  12 hours Mechanism of injury: direct blow   Pain details:    Quality:  Aching   Severity:  Mild   Timing:  Constant Chronicity:  New Associated symptoms: no blurred vision, no headaches and no neck pain    Pt reports garage door hit her head.  Pt complains of a laceration.   Pt also request medication for a yeast infection     Past Medical History:  Diagnosis Date   Abnormal Pap smear    Anemia    Anxiety    Chlamydia    COVID-19    GERD (gastroesophageal reflux disease)    Headache(784.0)    Human papilloma virus    cells removed x2   Spinal headache    Trichomonas    Urinary tract infection    Vaginal Pap smear, abnormal     Patient Active Problem List   Diagnosis Date Noted   Chronic idiopathic constipation 03/18/2019   Asymptomatic microscopic hematuria 02/24/2019   SOB (shortness of breath) 09/11/2018   Acute sinus infection 06/07/2017   Anxious mood as adjustment reaction 05/07/2017   Family history of cervical cancer 05/03/2016   Family history of ovarian cancer 05/03/2016   Anemia 05/03/2016   GASTROESOPHAGEAL REFLUX DISEASE 07/09/2007   ABDOMINAL PAIN 07/09/2007   EPIGASTRIC PAIN 07/09/2007    Past Surgical History:  Procedure Laterality Date   DILATION AND CURETTAGE OF UTERUS     GYNECOLOGIC CRYOSURGERY     PILONIDAL CYST EXCISION     THERAPEUTIC ABORTION       OB History    Gravida  7   Para  3   Term  3   Preterm  0   AB  4   Living  3     SAB  0   TAB  4   Ectopic  0   Multiple  0   Live Births  1           Family  History  Problem Relation Age of Onset   Hypertension Mother    Fibroids Mother    Cancer Mother        ovarian   Cancer Maternal Grandfather        cervical   Thyroid disease Brother     Social History   Tobacco Use   Smoking status: Current Every Day Smoker    Packs/day: 0.25    Years: 14.00    Pack years: 3.50    Types: Cigarettes   Smokeless tobacco: Never Used  Scientific laboratory technician Use: Never used  Substance Use Topics   Alcohol use: Yes    Comment:  socially   Drug use: No    Home Medications Prior to Admission medications   Medication Sig Start Date End Date Taking? Authorizing Provider  acetaminophen (TYLENOL) 500 MG tablet Take 1 tablet (500 mg total) by mouth every 6 (six) hours as needed. 08/13/19   Nche, Charlene Brooke, NP  albuterol (PROVENTIL HFA;VENTOLIN HFA) 108 (90 Base) MCG/ACT inhaler Inhale 2 puffs into the lungs every 6 (six) hours as needed  for wheezing or shortness of breath.    [provider]  fluconazole (DIFLUCAN) 150 MG tablet Take 1 tablet (150 mg total) by mouth daily. 03/06/20   Fransico Meadow, PA-C  IRON PO iron    [provider]  pantoprazole (PROTONIX) 20 MG tablet Take 1 tablet (20 mg total) by mouth daily. 03/17/19   Flossie Buffy, NP  FLUoxetine (PROZAC) 20 MG capsule  09/08/18 03/06/20  [provider]  fluticasone (FLONASE) 50 MCG/ACT nasal spray Place into both nostrils daily. Patient not taking: Reported on 12/17/2019  03/06/20  [provider]  montelukast (SINGULAIR) 10 MG tablet Take 10 mg by mouth at bedtime. Patient not taking: Reported on 12/17/2019  03/06/20  [provider]    Allergies    Patient has no known allergies.  Review of Systems   Review of Systems  Eyes: Negative for blurred vision.  Musculoskeletal: Negative for neck pain.  Neurological: Negative for headaches.  All other systems reviewed and are negative.   Physical Exam Updated Vital Signs BP  107/78 (BP Location: Right Arm)    Pulse 92    Temp 99.1 F (37.3 C) (Oral)    Resp 16    LMP 02/24/2020 (Approximate)    SpO2 100%   Physical Exam Vitals reviewed.  Constitutional:      Appearance: Normal appearance.  HENT:     Head: Normocephalic.     Comments: 1cm laceration right scalp    Mouth/Throat:     Mouth: Mucous membranes are moist.  Eyes:     Pupils: Pupils are equal, round, and reactive to light.  Cardiovascular:     Rate and Rhythm: Normal rate.  Pulmonary:     Effort: Pulmonary effort is normal.  Skin:    General: Skin is warm.  Neurological:     General: No focal deficit present.     Mental Status: She is alert.  Psychiatric:        Mood and Affect: Mood normal.     ED Results / Procedures / Treatments   Labs (all labs ordered are listed, but only abnormal results are displayed) Labs Reviewed - No data to display  EKG None  Radiology No results found.  Procedures .Marland KitchenLaceration Repair  Date/Time: 03/06/2020 12:46 PM Performed by: Fransico Meadow, PA-C Authorized by: Fransico Meadow, PA-C   Consent:    Consent obtained:  Verbal   Consent given by:  Patient   Risks discussed:  Infection, need for additional repair, pain, poor cosmetic result and poor wound healing   Alternatives discussed:  No treatment and delayed treatment Universal protocol:    Procedure explained and questions answered to patient or proxy's satisfaction: yes     Relevant documents present and verified: yes     Test results available and properly labeled: yes     Imaging studies available: yes     Required blood products, implants, devices, and special equipment available: yes     Site/side marked: yes     Immediately prior to procedure, a time out was called: yes     Patient identity confirmed:  Verbally with patient Anesthesia (see MAR for exact dosages):    Anesthesia method:  Local infiltration   Local anesthetic:  Lidocaine 1% w/o epi Laceration details:    Location:   Scalp   Scalp location:  R parietal   Length (cm):  1 Repair type:    Repair type:  Simple Pre-procedure details:    Preparation:  Patient was prepped and draped in usual sterile fashion Exploration:    Contaminated: no   Treatment:    Area cleansed with:  Betadine   Amount of cleaning:  Standard Skin repair:    Repair method:  Staples   Number of staples:  2 Approximation:    Approximation:  Close Post-procedure details:    Patient tolerance of procedure:  Tolerated well, no immediate complications   (including critical care time)  Medications Ordered in ED Medications - No data to display  ED Course  I have reviewed the triage vital signs and the nursing notes.  Pertinent labs & imaging results that were available during my care of the patient were reviewed by me and considered in my medical decision making (see chart for details).    MDM Rules/Calculators/A&P                          MDM:  Pt advised staple removal in 8 days.  Final Clinical Impression(s) / ED Diagnoses Final diagnoses:  Laceration of scalp, initial encounter  Yeast vaginitis    Rx / DC Orders ED Discharge Orders         Ordered    fluconazole (DIFLUCAN) 150 MG tablet  Daily        03/06/20 1239        An After Visit Summary was printed and given to the patient.    Fransico Meadow, PA-C 03/06/20 1247    Malvin Johns, MD 03/06/20 1435

## 2020-03-16 ENCOUNTER — Encounter (HOSPITAL_COMMUNITY): Payer: Self-pay | Admitting: Emergency Medicine

## 2020-03-16 ENCOUNTER — Emergency Department (HOSPITAL_COMMUNITY)
Admission: EM | Admit: 2020-03-16 | Discharge: 2020-03-16 | Disposition: A | Discharge status: Home / Self Care | Payer: BC Managed Care – PPO | Attending: Emergency Medicine | Admitting: Emergency Medicine

## 2020-03-16 DIAGNOSIS — W228XXD Striking against or struck by other objects, subsequent encounter: Secondary | ICD-10-CM | POA: Diagnosis not present

## 2020-03-16 DIAGNOSIS — Z8616 Personal history of COVID-19: Secondary | ICD-10-CM | POA: Diagnosis not present

## 2020-03-16 DIAGNOSIS — F17210 Nicotine dependence, cigarettes, uncomplicated: Secondary | ICD-10-CM | POA: Insufficient documentation

## 2020-03-16 DIAGNOSIS — Z4802 Encounter for removal of sutures: Secondary | ICD-10-CM

## 2020-03-16 DIAGNOSIS — S0101XD Laceration without foreign body of scalp, subsequent encounter: Secondary | ICD-10-CM | POA: Insufficient documentation

## 2020-03-16 NOTE — ED Triage Notes (Signed)
Pt reports staples to scalp on 10/3, here for removal today.

## 2020-03-16 NOTE — Discharge Instructions (Signed)
Follow wound care instructions listed below. Follow-up with your primary care provider. Return to the ER for drainage, fever, increased pain.

## 2020-03-16 NOTE — ED Provider Notes (Signed)
[facility] Provider Note   CSN: [id number] Arrival date & time: 03/16/20  1103     History Chief Complaint  Patient presents with  . Suture / Staple Removal    REDINA Cole is a 34 y.o. female who presents to ED requesting staple removal. Had staples placed in her scalp on 03/06/2020 after hitting her head with a garage door. Reports the area has been itchy but otherwise no issues.  HPI     Past Medical History:  Diagnosis Date  . Abnormal Pap smear   . Anemia   . Anxiety   . Chlamydia   . COVID-19   . GERD (gastroesophageal reflux disease)   . Headache(784.0)   . Human papilloma virus    cells removed x2  . Spinal headache   . Trichomonas   . Urinary tract infection   . Vaginal Pap smear, abnormal     Patient Active Problem List   Diagnosis Date Noted  . Chronic idiopathic constipation 03/18/2019  . Asymptomatic microscopic hematuria 02/24/2019  . SOB (shortness of breath) 09/11/2018  . Acute sinus infection 06/07/2017  . Anxious mood as adjustment reaction 05/07/2017  . Family history of cervical cancer 05/03/2016  . Family history of ovarian cancer 05/03/2016  . Anemia 05/03/2016  . GASTROESOPHAGEAL REFLUX DISEASE 07/09/2007  . ABDOMINAL PAIN 07/09/2007  . EPIGASTRIC PAIN 07/09/2007    Past Surgical History:  Procedure Laterality Date  . DILATION AND CURETTAGE OF UTERUS    . GYNECOLOGIC CRYOSURGERY    . PILONIDAL CYST EXCISION    . THERAPEUTIC ABORTION       OB History    Gravida  7   Para  3   Term  3   Preterm  0   AB  4   Living  3     SAB  0   TAB  4   Ectopic  0   Multiple  0   Live Births  1           Family History  Problem Relation Age of Onset  . Hypertension Mother   . Fibroids Mother   . Cancer Mother        ovarian  . Cancer Maternal Grandfather        cervical  . Thyroid disease Brother     Social History   Tobacco Use  . Smoking status: Current Every Day  Smoker    Packs/day: 0.25    Years: 14.00    Pack years: 3.50    Types: Cigarettes  . Smokeless tobacco: Never Used  Vaping Use  . Vaping Use: Never used  Substance Use Topics  . Alcohol use: Yes    Comment:  socially  . Drug use: No    Home Medications Prior to Admission medications   Medication Sig Start Date End Date Taking? Authorizing Provider  acetaminophen (TYLENOL) 500 MG tablet Take 1 tablet (500 mg total) by mouth every 6 (six) hours as needed. 08/13/19   Nche, Charlene Brooke, NP  albuterol (PROVENTIL HFA;VENTOLIN HFA) 108 (90 Base) MCG/ACT inhaler Inhale 2 puffs into the lungs every 6 (six) hours as needed for wheezing or shortness of breath.    [provider]  fluconazole (DIFLUCAN) 150 MG tablet Take 1 tablet (150 mg total) by mouth daily. 03/06/20   Fransico Meadow, PA-C  IRON PO iron    [provider]  pantoprazole (PROTONIX) 20 MG tablet Take 1 tablet (20 mg total)  by mouth daily. 03/17/19   Flossie Buffy, NP  FLUoxetine (PROZAC) 20 MG capsule  09/08/18 03/06/20  [provider]  fluticasone (FLONASE) 50 MCG/ACT nasal spray Place into both nostrils daily. Patient not taking: Reported on 12/17/2019  03/06/20  [provider]  montelukast (SINGULAIR) 10 MG tablet Take 10 mg by mouth at bedtime. Patient not taking: Reported on 12/17/2019  03/06/20  [provider]    Allergies    Patient has no known allergies.  Review of Systems   Review of Systems  Constitutional: Negative for chills and fever.  Skin: Positive for wound.  Neurological: Negative for weakness and numbness.    Physical Exam Updated Vital Signs BP 116/63 (BP Location: Left Arm)   Pulse 93   Temp 98.8 F (37.1 C) (Oral)   Resp 15   LMP 02/24/2020 (Approximate)   SpO2 99%   Physical Exam Vitals and nursing note reviewed.  Constitutional:      General: She is not in acute distress.    Appearance: She is well-developed. She is not diaphoretic.    HENT:     Head: Normocephalic and atraumatic.  Eyes:     General: No scleral icterus.    Conjunctiva/sclera: Conjunctivae normal.  Pulmonary:     Effort: Pulmonary effort is normal. No respiratory distress.  Musculoskeletal:     Cervical back: Normal range of motion.  Skin:    Findings: No rash.     Comments: 2 staples noted on scalp. Wound is healing well.  Neurological:     Mental Status: She is alert.     ED Results / Procedures / Treatments   Labs (all labs ordered are listed, but only abnormal results are displayed) Labs Reviewed - No data to display  EKG None  Radiology No results found.  Procedures .Suture Removal  Date/Time: 03/16/2020 12:26 PM Performed by: Delia Heady, PA-C Authorized by: Delia Heady, PA-C   Consent:    Consent obtained:  Verbal   Consent given by:  Patient   Risks discussed:  Bleeding, pain and wound separation   Alternatives discussed:  No treatment Location:    Location:  Head/neck   Head/neck location:  Scalp Procedure details:    Wound appearance:  No signs of infection, good wound healing and clean   Number of staples removed:  2 Post-procedure details:    Post-removal:  No dressing applied   Patient tolerance of procedure:  Tolerated well, no immediate complications   (including critical care time)  Medications Ordered in ED Medications - No data to display  ED Course  I have reviewed the triage vital signs and the nursing notes.  Pertinent labs & imaging results that were available during my care of the patient were reviewed by me and considered in my medical decision making (see chart for details).    MDM Rules/Calculators/A&P                          34 year old female presenting to the ED requesting staple remover. Had 2 staples placed on her scalp 10 days ago. She denies any complaints. Wound appears to be healing well. Staples removed successfully. Given wound care instructions.  Patient is hemodynamically  stable, in NAD, and able to ambulate in the ED. Evaluation does not show pathology that would require ongoing emergent intervention or inpatient treatment. I explained the diagnosis to the patient. Pain has been managed and has no complaints prior to discharge. Patient  is comfortable with above plan and is stable for discharge at this time. All questions were answered prior to disposition. Strict return precautions for returning to the ED were discussed. Encouraged follow up with PCP.   An After Visit Summary was printed and given to the patient.   Portions of this note were generated with [employer]. Dictation errors may occur despite best attempts at proofreading.  Final Clinical Impression(s) / ED Diagnoses Final diagnoses:  Encounter for staple removal    Rx / DC Orders ED Discharge Orders    None       Delia Heady, PA-C 03/16/20 [place], DO 03/16/20 1234

## 2021-02-11 ENCOUNTER — Encounter (HOSPITAL_BASED_OUTPATIENT_CLINIC_OR_DEPARTMENT_OTHER): Payer: Self-pay | Admitting: Emergency Medicine

## 2021-02-11 ENCOUNTER — Other Ambulatory Visit: Payer: Self-pay

## 2021-02-11 ENCOUNTER — Emergency Department (HOSPITAL_BASED_OUTPATIENT_CLINIC_OR_DEPARTMENT_OTHER)
Admission: EM | Admit: 2021-02-11 | Discharge: 2021-02-11 | Disposition: A | Discharge status: Home / Self Care | Payer: Self-pay | Attending: Emergency Medicine | Admitting: Emergency Medicine

## 2021-02-11 DIAGNOSIS — F17210 Nicotine dependence, cigarettes, uncomplicated: Secondary | ICD-10-CM | POA: Insufficient documentation

## 2021-02-11 DIAGNOSIS — B9689 Other specified bacterial agents as the cause of diseases classified elsewhere: Secondary | ICD-10-CM | POA: Insufficient documentation

## 2021-02-11 DIAGNOSIS — Z8616 Personal history of COVID-19: Secondary | ICD-10-CM | POA: Insufficient documentation

## 2021-02-11 DIAGNOSIS — N390 Urinary tract infection, site not specified: Secondary | ICD-10-CM | POA: Insufficient documentation

## 2021-02-11 LAB — URINALYSIS, ROUTINE W REFLEX MICROSCOPIC
Bilirubin Urine: NEGATIVE
Glucose, UA: NEGATIVE mg/dL
Ketones, ur: NEGATIVE mg/dL
Nitrite: NEGATIVE
Protein, ur: NEGATIVE mg/dL
Specific Gravity, Urine: 1.01 (ref 1.005–1.030)
pH: 6 (ref 5.0–8.0)

## 2021-02-11 LAB — CBG MONITORING, ED: Glucose-Capillary: 94 mg/dL (ref 70–99)

## 2021-02-11 LAB — URINALYSIS, MICROSCOPIC (REFLEX): WBC, UA: >50 WBC/hpf (ref 0–5)

## 2021-02-11 LAB — PREGNANCY, URINE: Preg Test, Ur: NEGATIVE

## 2021-02-11 MED ORDER — FLUCONAZOLE 200 MG PO TABS: 200.0000 mg | ORAL_TABLET | Freq: Once | ORAL | 0 refills | Status: AC

## 2021-02-11 MED ORDER — CEPHALEXIN 500 MG PO CAPS: 500.0000 mg | ORAL_CAPSULE | Freq: Two times a day (BID) | ORAL | 0 refills | Status: AC

## 2021-02-11 NOTE — ED Provider Notes (Signed)
[facility] Provider Note   CSN: [id number] Arrival date & time: 02/11/21  [id number]     History Chief Complaint  Patient presents with   Urinary Frequency    Ashley Cole is a 35 y.o. female.  Patient here for urinary frequency.  Concern for urine infection because she has had history of recurrent UTIs.  Not concern for any STDs.  Recently had that checked out.  Nuys any vaginal discharge or bleeding.  The history is provided by the patient.  Urinary Frequency This is a new problem. The problem occurs daily. The problem has not changed since onset.Pertinent negatives include no chest pain, no abdominal pain, no headaches and no shortness of breath. Nothing aggravates the symptoms. Nothing relieves the symptoms. She has tried nothing for the symptoms.      Past Medical History:  Diagnosis Date   Abnormal Pap smear    Anemia    Anxiety    Chlamydia    COVID-19    GERD (gastroesophageal reflux disease)    Headache(784.0)    Human papilloma virus    cells removed x2   Spinal headache    Trichomonas    Urinary tract infection    Vaginal Pap smear, abnormal     Patient Active Problem List   Diagnosis Date Noted   Chronic idiopathic constipation 03/18/2019   Asymptomatic microscopic hematuria 02/24/2019   SOB (shortness of breath) 09/11/2018   Acute sinus infection 06/07/2017   Anxious mood as adjustment reaction 05/07/2017   Family history of cervical cancer 05/03/2016   Family history of ovarian cancer 05/03/2016   Anemia 05/03/2016   GASTROESOPHAGEAL REFLUX DISEASE 07/09/2007   ABDOMINAL PAIN 07/09/2007   EPIGASTRIC PAIN 07/09/2007    Past Surgical History:  Procedure Laterality Date   DILATION AND CURETTAGE OF UTERUS     GYNECOLOGIC CRYOSURGERY     PILONIDAL CYST EXCISION     THERAPEUTIC ABORTION       OB History     Gravida  7   Para  3   Term  3   Preterm  0   AB  4   Living  3      SAB  0   IAB  4    Ectopic  0   Multiple  0   Live Births  1           Family History  Problem Relation Age of Onset   Hypertension Mother    Fibroids Mother    Cancer Mother        ovarian   Cancer Maternal Grandfather        cervical   Thyroid disease Brother     Social History   Tobacco Use   Smoking status: Every Day    Packs/day: 0.25    Years: 14.00    Pack years: 3.50    Types: Cigarettes   Smokeless tobacco: Never  Vaping Use   Vaping Use: Never used  Substance Use Topics   Alcohol use: Yes    Comment:  socially   Drug use: No    Home Medications Prior to Admission medications   Medication Sig Start Date End Date Taking? Authorizing Provider  cephALEXin (KEFLEX) 500 MG capsule Take 1 capsule (500 mg total) by mouth 2 (two) times daily for 7 days. 02/11/21 02/18/21 Yes Dvora Buitron, DO  fluconazole (DIFLUCAN) 200 MG tablet Take 1 tablet (200 mg total) by mouth once for 1 dose. 02/11/21 02/11/21 Yes  Cyler Kappes, DO  acetaminophen (TYLENOL) 500 MG tablet Take 1 tablet (500 mg total) by mouth every 6 (six) hours as needed. 08/13/19   Nche, Charlene Brooke, NP  albuterol (PROVENTIL HFA;VENTOLIN HFA) 108 (90 Base) MCG/ACT inhaler Inhale 2 puffs into the lungs every 6 (six) hours as needed for wheezing or shortness of breath.    [provider]  IRON PO iron    [provider]  pantoprazole (PROTONIX) 20 MG tablet Take 1 tablet (20 mg total) by mouth daily. 03/17/19   Flossie Buffy, NP  FLUoxetine (PROZAC) 20 MG capsule  09/08/18 03/06/20  [provider]  fluticasone (FLONASE) 50 MCG/ACT nasal spray Place into both nostrils daily. Patient not taking: Reported on 12/17/2019  03/06/20  [provider]  montelukast (SINGULAIR) 10 MG tablet Take 10 mg by mouth at bedtime. Patient not taking: Reported on 12/17/2019  03/06/20  [provider]    Allergies    Patient has no known allergies.  Review of Systems   Review of Systems   Constitutional:  Negative for chills and fever.  HENT:  Negative for ear pain and sore throat.   Eyes:  Negative for pain and visual disturbance.  Respiratory:  Negative for cough and shortness of breath.   Cardiovascular:  Negative for chest pain and palpitations.  Gastrointestinal:  Negative for abdominal pain and vomiting.  Genitourinary:  Positive for frequency. Negative for decreased urine volume, dysuria, hematuria, vaginal bleeding and vaginal discharge.  Musculoskeletal:  Negative for arthralgias and back pain.  Skin:  Negative for color change and rash.  Neurological:  Negative for seizures, syncope and headaches.  All other systems reviewed and are negative.  Physical Exam Updated Vital Signs BP 115/71   Pulse 80   Temp 98.5 F (36.9 C) (Oral)   Resp 16   Ht 5' 0.5" (1.537 m)   Wt 67.1 kg   LMP 01/25/2021 (Exact Date)   SpO2 100%   BMI 28.43 kg/m   Physical Exam Vitals and nursing note reviewed.  Constitutional:      General: She is not in acute distress.    Appearance: She is well-developed.  HENT:     Head: Normocephalic and atraumatic.  Eyes:     Conjunctiva/sclera: Conjunctivae normal.  Cardiovascular:     Rate and Rhythm: Normal rate and regular rhythm.     Pulses: Normal pulses.     Heart sounds: No murmur heard. Pulmonary:     Effort: Pulmonary effort is normal. No respiratory distress.     Breath sounds: Normal breath sounds.  Abdominal:     Palpations: Abdomen is soft.     Tenderness: There is no abdominal tenderness.  Musculoskeletal:     Cervical back: Neck supple.  Skin:    General: Skin is warm and dry.     Capillary Refill: Capillary refill takes less than 2 seconds.  Neurological:     Mental Status: She is alert.    ED Results / Procedures / Treatments   Labs (all labs ordered are listed, but only abnormal results are displayed) Labs Reviewed  URINALYSIS, ROUTINE W REFLEX MICROSCOPIC - Abnormal; Notable for the following components:       Result Value   Color, Urine STRAW (*)    APPearance CLOUDY (*)    Hgb urine dipstick LARGE (*)    Leukocytes,Ua LARGE (*)    All other components within normal limits  URINALYSIS, MICROSCOPIC (REFLEX) - Abnormal; Notable for the following components:  Bacteria, UA MANY (*)    All other components within normal limits  URINE CULTURE  PREGNANCY, URINE  CBG MONITORING, ED    EKG None  Radiology No results found.  Procedures Procedures   Medications Ordered in ED Medications - No data to display  ED Course  I have reviewed the triage vital signs and the nursing notes.  Pertinent labs & imaging results that were available during my care of the patient were reviewed by me and considered in my medical decision making (see chart for details).    MDM Rules/Calculators/A&P                           Ashley Cole is here with urinary frequency.  History of frequent UTIs.  Denies any vaginal discharge or bleeding.  Recent STD testing and has no concern for STDs and would not like evaluation for that.  Has not had much discomfort in her abdomen or flank.  No history of kidney stone.  Urinalysis appears consistent with infection.  Blood sugar is normal.  Overall appears to have an uncomplicated urine infection.  No fever.  No abdominal tenderness.  Understands return precautions.  Will prescribe antibiotic.  Given prescription for Diflucan as she states she does get yeast infections after antibiotics.  Discharged in good condition.  This chart was dictated using voice recognition software.  Despite best efforts to proofread,  errors can occur which can change the documentation meaning.   Final Clinical Impression(s) / ED Diagnoses Final diagnoses:  Lower urinary tract infectious disease    Rx / DC Orders ED Discharge Orders          Ordered    cephALEXin (KEFLEX) 500 MG capsule  2 times daily        02/11/21 0911    fluconazole (DIFLUCAN) 200 MG tablet   Once         02/11/21 0911             Lennice Sites, DO 02/11/21 [id number]

## 2021-02-11 NOTE — Discharge Instructions (Addendum)
Take antibiotic as prescribed.  Take Diflucan after you finish antibiotic.

## 2021-02-11 NOTE — ED Triage Notes (Signed)
Urinary frequency and urgency x 1 week. Cloudy urine

## 2021-02-13 LAB — URINE CULTURE: Culture: 20000 — AB

## 2021-02-14 ENCOUNTER — Telehealth: Payer: Self-pay

## 2021-02-14 NOTE — Telephone Encounter (Signed)
Post ED Visit - Positive Culture Follow-up  Culture report reviewed by antimicrobial stewardship pharmacist: [facility] Team '[x]'$  Joetta Manners, Pharm.D, BCCCP '[]'$  Heide Guile, Pharm.D., BCPS AQ-ID '[]'$  Parks Neptune, Pharm.D., BCPS '[]'$  Alycia Rossetti, Pharm.D., BCPS '[]'$  Viking, Pharm.D., BCPS, AAHIVP '[]'$  Legrand Como, Pharm.D., BCPS, AAHIVP '[]'$  Salome Arnt, PharmD, BCPS '[]'$  Johnnette Gourd, PharmD, BCPS '[]'$  Hughes Better, PharmD, BCPS '[]'$  Leeroy Cha, PharmD '[]'$  Laqueta Linden, PharmD, BCPS '[]'$  Albertina Parr, PharmD  [facility] Team '[]'$  Leodis Sias, PharmD '[]'$  Lindell Spar, PharmD '[]'$  Royetta Asal, PharmD '[]'$  Graylin Shiver, Rph '[]'$  Rema Fendt) Glennon Mac, PharmD '[]'$  Arlyn Dunning, PharmD '[]'$  Netta Cedars, PharmD '[]'$  Dia Sitter, PharmD '[]'$  Leone Haven, PharmD '[]'$  Gretta Arab, PharmD '[]'$  Theodis Shove, PharmD '[]'$  Peggyann Juba, PharmD '[]'$  Reuel Boom, PharmD   Positive urine culture Treated with Cephalexin and Fluconazole, organism sensitive to the same and no further patient follow-up is required at this time.  Glennon Hamilton 02/14/2021, 10:05 AM

## 2021-12-27 ENCOUNTER — Emergency Department (HOSPITAL_BASED_OUTPATIENT_CLINIC_OR_DEPARTMENT_OTHER)
Admission: EM | Admit: 2021-12-27 | Discharge: 2021-12-27 | Discharge status: Against Medical Advice | Payer: Self-pay | Attending: Emergency Medicine | Admitting: Emergency Medicine

## 2021-12-27 ENCOUNTER — Other Ambulatory Visit: Payer: Self-pay

## 2021-12-27 ENCOUNTER — Encounter (HOSPITAL_BASED_OUTPATIENT_CLINIC_OR_DEPARTMENT_OTHER): Payer: Self-pay

## 2021-12-27 DIAGNOSIS — R5383 Other fatigue: Secondary | ICD-10-CM | POA: Insufficient documentation

## 2021-12-27 DIAGNOSIS — Z5321 Procedure and treatment not carried out due to patient leaving prior to being seen by health care provider: Secondary | ICD-10-CM | POA: Insufficient documentation

## 2021-12-27 DIAGNOSIS — Z20822 Contact with and (suspected) exposure to covid-19: Secondary | ICD-10-CM | POA: Insufficient documentation

## 2021-12-27 DIAGNOSIS — R059 Cough, unspecified: Secondary | ICD-10-CM | POA: Insufficient documentation

## 2021-12-27 DIAGNOSIS — R0981 Nasal congestion: Secondary | ICD-10-CM | POA: Insufficient documentation

## 2021-12-27 LAB — URINALYSIS, ROUTINE W REFLEX MICROSCOPIC
Bilirubin Urine: NEGATIVE
Glucose, UA: NEGATIVE mg/dL
Ketones, ur: NEGATIVE mg/dL
Leukocytes,Ua: NEGATIVE
Nitrite: NEGATIVE
Specific Gravity, Urine: 1.028 (ref 1.005–1.030)
pH: 6.5 (ref 5.0–8.0)

## 2021-12-27 LAB — SARS CORONAVIRUS 2 BY RT PCR: SARS Coronavirus 2 by RT PCR: NEGATIVE

## 2021-12-27 NOTE — ED Triage Notes (Signed)
Pt presents POV from home with productive cough, white colored mucus, chest congestion and fatigue x5 days.  Pt also concerned for vaginal itching since Sunday, wants to see if its a yeast infection

## 2021-12-27 NOTE — ED Notes (Signed)
Pt spoke to registration. Stated she was leaving due to wait time.

## 2021-12-29 ENCOUNTER — Other Ambulatory Visit: Payer: Self-pay

## 2021-12-29 ENCOUNTER — Emergency Department (HOSPITAL_BASED_OUTPATIENT_CLINIC_OR_DEPARTMENT_OTHER)
Admission: EM | Admit: 2021-12-29 | Discharge: 2021-12-29 | Disposition: A | Discharge status: Home / Self Care | Payer: Self-pay | Attending: Emergency Medicine | Admitting: Emergency Medicine

## 2021-12-29 ENCOUNTER — Encounter (HOSPITAL_BASED_OUTPATIENT_CLINIC_OR_DEPARTMENT_OTHER): Payer: Self-pay

## 2021-12-29 DIAGNOSIS — B349 Viral infection, unspecified: Secondary | ICD-10-CM | POA: Insufficient documentation

## 2021-12-29 DIAGNOSIS — Z79899 Other long term (current) drug therapy: Secondary | ICD-10-CM | POA: Insufficient documentation

## 2021-12-29 DIAGNOSIS — L292 Pruritus vulvae: Secondary | ICD-10-CM | POA: Insufficient documentation

## 2021-12-29 LAB — URINALYSIS, MICROSCOPIC (REFLEX)

## 2021-12-29 LAB — PREGNANCY, URINE: Preg Test, Ur: NEGATIVE

## 2021-12-29 LAB — WET PREP, GENITAL
Clue Cells Wet Prep HPF POC: NONE SEEN
Sperm: NONE SEEN
Trich, Wet Prep: NONE SEEN
WBC, Wet Prep HPF POC: <10 (ref <10)
Yeast Wet Prep HPF POC: NONE SEEN

## 2021-12-29 LAB — URINALYSIS, ROUTINE W REFLEX MICROSCOPIC
Bilirubin Urine: NEGATIVE
Glucose, UA: NEGATIVE mg/dL
Ketones, ur: NEGATIVE mg/dL
Leukocytes,Ua: NEGATIVE
Nitrite: NEGATIVE
Protein, ur: NEGATIVE mg/dL
Specific Gravity, Urine: 1.01 (ref 1.005–1.030)
pH: 5.5 (ref 5.0–8.0)

## 2021-12-29 LAB — HIV ANTIBODY (ROUTINE TESTING W REFLEX): HIV Screen 4th Generation wRfx: NONREACTIVE

## 2021-12-29 MED ORDER — BENZONATATE 100 MG PO CAPS: 100.0000 mg | ORAL_CAPSULE | Freq: Three times a day (TID) | ORAL | 0 refills | Status: DC

## 2021-12-29 MED ORDER — GUAIFENESIN 100 MG/5ML PO LIQD
10.0000 mL | Freq: Once | ORAL | Status: AC
  Administered 2021-12-29: 10 mL via ORAL
  Filled 2021-12-29: qty 10

## 2021-12-29 MED ORDER — LOPERAMIDE HCL 2 MG PO CAPS: 2.0000 mg | ORAL_CAPSULE | Freq: Four times a day (QID) | ORAL | 0 refills | Status: DC | PRN

## 2021-12-29 NOTE — ED Provider Notes (Signed)
[facility] Provider Note   CSN: [id number] Arrival date & time: 12/29/21  0913     History  Chief Complaint  Patient presents with   Cough   Diarrhea   Vaginal Itching    Ashley Cole is a 36 y.o. female with a history of asymptomatic microscopic hematuria presenting with URI symptoms.  She reports that these have been going on for nearly a week.  She went to another emergency department 2 days ago to be evaluated for left prior to being seen due to the wait.  At that time her COVID test was negative.  She is also concerned about some vaginal discharge and itching.  She reports that a few months ago she found out that her husband had been sexually active with other individuals.  She was STD tested at that time a few months back and everything was negative.  She says that she has not been sexually active since.  Would still like to be tested for STDs.  Her URI symptoms including sore throat, congestion, dry cough and diarrhea.  She has been using Mucinex which has helped her congestion.  No abdominal pain, shortness of breath or chest pain.  Cough Associated symptoms: no chills and no fever   Diarrhea Associated symptoms: no chills, no fever and no vomiting   Vaginal Itching       Home Medications Prior to Admission medications   Medication Sig Start Date End Date Taking? Authorizing Provider  acetaminophen (TYLENOL) 500 MG tablet Take 1 tablet (500 mg total) by mouth every 6 (six) hours as needed. 08/13/19   Nche, Charlene Brooke, NP  albuterol (PROVENTIL HFA;VENTOLIN HFA) 108 (90 Base) MCG/ACT inhaler Inhale 2 puffs into the lungs every 6 (six) hours as needed for wheezing or shortness of breath.    [provider]  IRON PO iron    [provider]  pantoprazole (PROTONIX) 20 MG tablet Take 1 tablet (20 mg total) by mouth daily. 03/17/19   Flossie Buffy, NP  FLUoxetine (PROZAC) 20 MG capsule  09/08/18 03/06/20  [provider]  fluticasone (FLONASE) 50 MCG/ACT nasal spray Place into both nostrils daily. Patient not taking: Reported on 12/17/2019  03/06/20  [provider]  montelukast (SINGULAIR) 10 MG tablet Take 10 mg by mouth at bedtime. Patient not taking: Reported on 12/17/2019  03/06/20  [provider]      Allergies    Patient has no known allergies.    Review of Systems   Review of Systems  Constitutional:  Negative for chills and fever.  Respiratory:  Positive for cough.   Gastrointestinal:  Positive for diarrhea. Negative for nausea and vomiting.  Neurological:  Positive for weakness.    Physical Exam Updated Vital Signs BP 133/75 (BP Location: Right Arm)   Pulse 88   Temp 98.4 F (36.9 C) (Oral)   Resp 18   Ht '5\' 1"'$  (1.549 m)   Wt 64.9 kg   SpO2 100%   BMI 27.02 kg/m  Physical Exam Vitals and nursing note reviewed.  Constitutional:      General: She is not in acute distress.    Appearance: Normal appearance.  HENT:     Head: Normocephalic and atraumatic.  Eyes:     General: No scleral icterus.    Conjunctiva/sclera: Conjunctivae normal.  Cardiovascular:     Rate and Rhythm: Normal rate and regular rhythm.  Pulmonary:     Effort: Pulmonary effort is normal. No  respiratory distress.     Breath sounds: No wheezing or rales.  Abdominal:     General: Abdomen is flat.     Palpations: Abdomen is soft.     Tenderness: There is no abdominal tenderness.  Genitourinary:    Comments: GU exam performed in presence of RN.  Patient had moderate amounts of clear/white discharge.  No obvious odor.  Cervix is normal in appearance.  No CMT.  No masses palpated on bimanual. Skin:    General: Skin is warm and dry.     Findings: No rash.  Neurological:     Mental Status: She is alert.  Psychiatric:        Mood and Affect: Mood normal.     ED Results / Procedures / Treatments   Labs (all labs ordered are listed, but only abnormal results are displayed) Labs  Reviewed  URINALYSIS, ROUTINE W REFLEX MICROSCOPIC - Abnormal; Notable for the following components:      Result Value   Hgb urine dipstick MODERATE (*)    All other components within normal limits  URINALYSIS, MICROSCOPIC (REFLEX) - Abnormal; Notable for the following components:   Bacteria, UA FEW (*)    All other components within normal limits  WET PREP, GENITAL  PREGNANCY, URINE  HIV ANTIBODY (ROUTINE TESTING W REFLEX)  GC/CHLAMYDIA PROBE AMP ([place]) NOT AT [facility]    EKG None  Radiology No results found.  Procedures Procedures   Medications Ordered in ED Medications - No data to display  ED Course/ Medical Decision Making/ A&P                           Medical Decision Making Amount and/or Complexity of Data Reviewed Labs: ordered.  Risk OTC drugs. Prescription drug management.   36 year old female presenting with 2 complaints.  First is URI symptoms, differential includes but is not limited to COVID, flu, other viral illness, pneumonia, sinus infection.  She is also having vaginal discharge and itching.  Differential includes but is not limited to yeast infection, BV, STI/PID UTI.  This is not an exhaustive differential.    Past Medical History / Co-morbidities / Social History: N/A   Additional history: I reviewed patient's results from 2 days ago.  It appears that she had hematuria at that time but no infection.  This is consistent with her chronic asymptomatic microscopic hematuria.  Per chart review she was seen at [facility] in 2019 and had a normal cystoscopy with their urology team.   Physical Exam: Pertinent physical exam findings include nontender abdomen.  Lab Tests: I ordered, and personally interpreted labs.  The pertinent results include:  Wet prep: Negative UA: Microscopic hematuria as expected, negative pregnancy   Imaging Studies: No abnormalities on physical exam.  Do not believe CT imaging or pelvic imaging is indicated at this time.     Medications: I ordered medication including Robitussin.    Disposition: 36 year old female presenting with URI symptoms and vaginal concern.  URI symptoms appear to be viral.  Low suspicion pneumonia, lung sounds clear.  Afebrile not tachycardic.  No shortness of breath.  Will not image at this time.  We will encourage her to use over-the-counter remedies for her congestion.  I have also prescribed Tessalon Perles to her pharmacy.  She complained of diarrhea for the past 2 days I have also sent Imodium.  Wet prep was negative.  She has not been exposed to any STDs that she knows  of since her negative testing a few months ago.  She will follow-up on her test results online and follow-up with the OB/GYN with any further vaginal concerns.  She is agreeable to this.  Stable and ready for discharge at this time.    Final diagnoses:  Viral illness    Rx / DC Orders ED Discharge Orders          Ordered    loperamide (IMODIUM) 2 MG capsule  4 times daily PRN        12/29/21 0941    benzonatate (TESSALON) 100 MG capsule  Every 8 hours        12/29/21 0941           Results and diagnoses were explained to the patient. Return precautions discussed in full. Patient had no additional questions and expressed complete understanding.   This chart was dictated using voice recognition software.  Despite best efforts to proofread,  errors can occur which can change the documentation meaning.    Darliss Ridgel 12/29/21 1044    Lucrezia Starch, MD 12/30/21 [id number]

## 2021-12-29 NOTE — ED Notes (Signed)
Pelvic Cart to bedside 

## 2021-12-29 NOTE — Discharge Instructions (Addendum)
Follow-up on your STD results online.  He is cooking oil or boric acid suppositories if you would like for your vaginal discomfort.  This may or may not help you but you need to follow-up with OB/GYN.  I have sent benzonatate for your cough and Imodium for any diarrhea to your pharmacy.  Return with any worsening symptoms, especially fever, chills and inability to keep down any food.

## 2021-12-29 NOTE — ED Triage Notes (Signed)
Patient c/o chest congestion with nonproductive cough  and headache x 7 days. States diarrhea started yesterday. Patient states she also wants to be seen for vaginal itching. Checked into [facility] but waited for 4 hours and didn't see anyone so she left.  Patient speaking in full sentences.

## 2022-01-01 LAB — GC/CHLAMYDIA PROBE AMP (~~LOC~~) NOT AT ARMC
Chlamydia: NEGATIVE
Comment: NEGATIVE
Comment: NORMAL
Neisseria Gonorrhea: NEGATIVE

## 2022-01-30 ENCOUNTER — Emergency Department (HOSPITAL_COMMUNITY)
Admission: EM | Admit: 2022-01-30 | Discharge: 2022-01-30 | Disposition: A | Discharge status: Home / Self Care | Payer: Self-pay | Attending: Student | Admitting: Student

## 2022-01-30 ENCOUNTER — Other Ambulatory Visit: Payer: Self-pay

## 2022-01-30 ENCOUNTER — Encounter (HOSPITAL_COMMUNITY): Payer: Self-pay

## 2022-01-30 DIAGNOSIS — J029 Acute pharyngitis, unspecified: Secondary | ICD-10-CM | POA: Insufficient documentation

## 2022-01-30 DIAGNOSIS — Z8616 Personal history of COVID-19: Secondary | ICD-10-CM | POA: Insufficient documentation

## 2022-01-30 DIAGNOSIS — R059 Cough, unspecified: Secondary | ICD-10-CM | POA: Insufficient documentation

## 2022-01-30 LAB — GROUP A STREP BY PCR: Group A Strep by PCR: NOT DETECTED

## 2022-01-30 MED ORDER — LIDOCAINE VISCOUS HCL 2 % MT SOLN: 15.0000 mL | OROMUCOSAL | 0 refills | Status: DC | PRN

## 2022-01-30 NOTE — ED Provider Triage Note (Signed)
Emergency Medicine Provider Triage Evaluation Note  Ashley Cole , a 36 y.o. female  was evaluated in triage.  Pt complains of sore throat for the last 5 weeks.  Patient reports her throat developed after having COVID.  The patient states that her sore throat has continued, she has been tested for STIs to include HIV, syphilis, gonorrhea chlamydia at [facility].  Patient requesting to be strep testing as well as HIV testing because she states that it was only 7 days after symptoms began that she was tested for HIV, she is concerned that the viral load was not high enough to be detected.  The patient states that she has had oral sex with new partner however this was before she was tested for gonorrhea/chlamydia.  Patient denies any fevers, nausea, vomiting, diarrhea.  Review of Systems  Positive:  Negative:   Physical Exam  BP 108/86 (BP Location: Left Arm)   Pulse 94   Temp 98.5 F (36.9 C) (Oral)   Resp 16   SpO2 100%  Gen:   Awake, no distress   Resp:  Normal effort  MSK:   Moves extremities without difficulty  Other:  Posterior oropharynx erythematous without exudate  Medical Decision Making  Medically screening exam initiated at 8:44 AM.  Appropriate orders placed.  PURVA VESSELL was informed that the remainder of the evaluation will be completed by another provider, this initial triage assessment does not replace that evaluation, and the importance of remaining in the ED until their evaluation is complete.     Azucena Cecil, PA-C 01/30/22 [phone]

## 2022-01-30 NOTE — Discharge Instructions (Signed)
Please return to the ED with any new or worsening signs or symptoms such as shortness of breath, inability to swallow Please follow-up with the Ashley Cole.  You will need to make an appointment to be seen. Please pick up prescription for viscous lidocaine Please continue treating symptoms at home with ibuprofen and Tylenol

## 2022-01-30 NOTE — ED Triage Notes (Signed)
Pt reports sore throat x1 month. Pt reports having COVID about 2 weeks ago and has since recovered, but still have sore throat.

## 2022-01-30 NOTE — ED Provider Notes (Signed)
[facility] DEPT Provider Note   CSN: [id number] Arrival date & time: 01/30/22  9629     History  Chief Complaint  Patient presents with   Sore Throat   Cough    Ashley Cole is a 36 y.o. female with medical history of trichomonas, urinary tract infections, abnormal vaginal Pap smears, HPV, headache, GERD, COVID-19.  Patient presents to ED for evaluation. Patient complains of sore throat for the last 5 weeks.  Patient reports her throat developed after having COVID. The patient states that she has been tested for STIs to include HIV, syphilis, gonorrhea chlamydia at the health department as well as [place].  Patient reports has been tested 3 times for syphilis, gonorrhea, chlamydia.  Patient reports she is been tested 2 times for HIV. Patient requesting to be strep testing as well as HIV testing because she states that it was only 7 days after symptoms began that she was tested for HIV, she is concerned that the viral load was not high enough to be detected.  The patient states that she has had oral sex with her husband, as well as a new female partner.  The patient reports that her symptoms developed after having oral sex with a female partner.  The patient reports that she has been tested for STIs since this time.  All were negative.  Patient denies any fevers, nausea, vomiting, diarrhea, abdominal pain, chest pain, shortness of breath, trouble swallowing.  Sore Throat Pertinent negatives include no chest pain, no abdominal pain and no shortness of breath.  Cough Associated symptoms: sore throat   Associated symptoms: no chest pain, no fever and no shortness of breath        Home Medications Prior to Admission medications   Medication Sig Start Date End Date Taking? Authorizing Provider  lidocaine (XYLOCAINE) 2 % solution Use as directed 15 mLs in the mouth or throat as needed for mouth pain. 01/30/22  Yes Azucena Cecil, PA-C  acetaminophen  (TYLENOL) 500 MG tablet Take 1 tablet (500 mg total) by mouth every 6 (six) hours as needed. 08/13/19   Nche, Charlene Brooke, NP  albuterol (PROVENTIL HFA;VENTOLIN HFA) 108 (90 Base) MCG/ACT inhaler Inhale 2 puffs into the lungs every 6 (six) hours as needed for wheezing or shortness of breath.    [provider]  benzonatate (TESSALON) 100 MG capsule Take 1 capsule (100 mg total) by mouth every 8 (eight) hours. 12/29/21   Redwine, Madison A, PA-C  IRON PO iron    [provider]  loperamide (IMODIUM) 2 MG capsule Take 1 capsule (2 mg total) by mouth 4 (four) times daily as needed for diarrhea or loose stools. 12/29/21   Redwine, Madison A, PA-C  pantoprazole (PROTONIX) 20 MG tablet Take 1 tablet (20 mg total) by mouth daily. 03/17/19   Flossie Buffy, NP  FLUoxetine (PROZAC) 20 MG capsule  09/08/18 03/06/20  [provider]  fluticasone (FLONASE) 50 MCG/ACT nasal spray Place into both nostrils daily. Patient not taking: Reported on 12/17/2019  03/06/20  [provider]  montelukast (SINGULAIR) 10 MG tablet Take 10 mg by mouth at bedtime. Patient not taking: Reported on 12/17/2019  03/06/20  [provider]      Allergies    Patient has no known allergies.    Review of Systems   Review of Systems  Constitutional:  Negative for fever.  HENT:  Positive for sore throat. Negative for drooling, trouble swallowing and voice change.  Respiratory:  Positive for cough. Negative for shortness of breath.   Cardiovascular:  Negative for chest pain.  Gastrointestinal:  Negative for abdominal pain, nausea and vomiting.  All other systems reviewed and are negative.   Physical Exam Updated Vital Signs BP 105/68 (BP Location: Left Arm)   Pulse 81   Temp 98.4 F (36.9 C) (Oral)   Resp 16   SpO2 100%  Physical Exam Vitals and nursing note reviewed.  Constitutional:      General: She is not in acute distress.    Appearance: She is well-developed. She is not  ill-appearing, toxic-appearing or diaphoretic.  HENT:     Head: Normocephalic and atraumatic.     Nose: Nose normal. No congestion.     Mouth/Throat:     Mouth: Mucous membranes are moist.     Pharynx: Oropharynx is clear. Posterior oropharyngeal erythema present. No oropharyngeal exudate.     Comments: Posterior oropharynx inspected with findings of erythema, no exudate.  The patient uvula is midline.  The patient is handling her secretions appropriately.  Patient obtunded.  The patient is speaking in full sentences. Eyes:     Extraocular Movements: Extraocular movements intact.     Conjunctiva/sclera: Conjunctivae normal.     Pupils: Pupils are equal, round, and reactive to light.  Cardiovascular:     Rate and Rhythm: Normal rate and regular rhythm.  Pulmonary:     Effort: Pulmonary effort is normal.     Breath sounds: Normal breath sounds. No wheezing.  Abdominal:     General: Abdomen is flat. Bowel sounds are normal.     Palpations: Abdomen is soft.     Tenderness: There is no abdominal tenderness.  Musculoskeletal:     Cervical back: Normal range of motion and neck supple. No tenderness.  Skin:    General: Skin is warm and dry.     Capillary Refill: Capillary refill takes less than 2 seconds.  Neurological:     Mental Status: She is alert and oriented to person, place, and time.     ED Results / Procedures / Treatments   Labs (all labs ordered are listed, but only abnormal results are displayed) Labs Reviewed  GROUP A STREP BY PCR    EKG None  Radiology No results found.  Procedures Procedures   Medications Ordered in ED Medications - No data to display  ED Course/ Medical Decision Making/ A&P                           Medical Decision Making  36 year old female presents to ED for evaluation.  Please see HPI for further details.  On examination the patient posterior oropharynx is erythematous without exudate.  The patient reveals midline.  The patient is  handling her secretions appropriately, she is not drooling, there is no potato voice.  Patient strep testing negative.  Patient requesting HIV testing however I advised her that she will need to follow-up with the health department for this.  Patient states she has already been tested for HIV 2 times.  Patient discharged home with recess lidocaine, instructions to pick up Chloraseptic spray to help numb throat.  Patient also counseled on warm salt water gargle.  Patient counseled to continue taking ibuprofen and Tylenol.  Patient referred to [facility] due to the fact she does not have an OB/GYN.  Patient given return precautions and she voiced understanding.  The patient had all of her questions answered  her satisfaction prior to discharge.  The patient stable this time for discharge home.  Final Clinical Impression(s) / ED Diagnoses Final diagnoses:  Sore throat    Rx / DC Orders ED Discharge Orders          Ordered    lidocaine (XYLOCAINE) 2 % solution  As needed        01/30/22 1309              Lawana Chambers 01/30/22 1314    Kommor, [place], MD 01/31/22 1018

## 2022-02-09 ENCOUNTER — Emergency Department (HOSPITAL_BASED_OUTPATIENT_CLINIC_OR_DEPARTMENT_OTHER)
Admission: EM | Admit: 2022-02-09 | Discharge: 2022-02-09 | Disposition: A | Discharge status: Home / Self Care | Payer: Self-pay | Attending: Emergency Medicine | Admitting: Emergency Medicine

## 2022-02-09 ENCOUNTER — Encounter (HOSPITAL_BASED_OUTPATIENT_CLINIC_OR_DEPARTMENT_OTHER): Payer: Self-pay

## 2022-02-09 ENCOUNTER — Other Ambulatory Visit: Payer: Self-pay

## 2022-02-09 DIAGNOSIS — J029 Acute pharyngitis, unspecified: Secondary | ICD-10-CM | POA: Insufficient documentation

## 2022-02-09 LAB — GROUP A STREP BY PCR: Group A Strep by PCR: NOT DETECTED

## 2022-02-09 LAB — HIV ANTIBODY (ROUTINE TESTING W REFLEX): HIV Screen 4th Generation wRfx: NONREACTIVE

## 2022-02-09 NOTE — Discharge Instructions (Addendum)
It was a pleasure taking care of you today!  Your strep swab results are pending.  You will be notified of any positive results and an antibiotic will be sent to your pharmacy.  You have pending results for HIV and syphilis, we will be able to see these results on MyChart when they result within 48-72 hours.  You will be notified of any positive results.  You may follow-up with your primary care provider regarding today's ED visit.  Ensure to maintain fluid intake with water, tea, broth, Pedialyte, Gatorade.  You may take over-the-counter 600 mg ibuprofen every 6 hours and alternate with 500 mg Tylenol every 6 hours as needed for pain for no more than 7 days.  Return to the emergency department if you are experiencing increasing/worsening sore throat, trouble swallowing, fever, worsening symptoms.

## 2022-02-09 NOTE — ED Notes (Signed)
Discharge instructions reviewed with patient. Patient verbalizes understanding, no further questions at this time. Medications and follow up information provided. No acute distress noted at time of departure.  

## 2022-02-09 NOTE — ED Provider Notes (Signed)
[facility] EMERGENCY DEPARTMENT Provider Note   CSN: [id number] Arrival date & time: 02/09/22  4098     History  Chief Complaint  Patient presents with  . Sore Throat    Ashley Cole is a 36 y.o. female who presents to the ED complaining sore throat onset 01/08/2022.  Patient notes that she took a home COVID test on 01/09/2022 that was positive.  She notes that since then she has had a negative home COVID test.  No meds tried prior to arrival.  Has associated headache, nasal congestion, rhinorrhea.  Also voices concern for want to be tested for HIV.  Was tested on 12/29/2021 however wants to be tested again because she is unsure if she took the test too early.  Denies fever, abdominal pain, nausea, vomiting, vaginal bleeding, vaginal discharge.  Denies concerns for gonorrhea or chlamydia testing today.  The history is provided by the patient. No language interpreter was used.       Home Medications Prior to Admission medications   Medication Sig Start Date End Date Taking? Authorizing Provider  acetaminophen (TYLENOL) 500 MG tablet Take 1 tablet (500 mg total) by mouth every 6 (six) hours as needed. 08/13/19   Nche, Charlene Brooke, NP  albuterol (PROVENTIL HFA;VENTOLIN HFA) 108 (90 Base) MCG/ACT inhaler Inhale 2 puffs into the lungs every 6 (six) hours as needed for wheezing or shortness of breath.    [provider]  benzonatate (TESSALON) 100 MG capsule Take 1 capsule (100 mg total) by mouth every 8 (eight) hours. 12/29/21   Redwine, Madison A, PA-C  IRON PO iron    [provider]  lidocaine (XYLOCAINE) 2 % solution Use as directed 15 mLs in the mouth or throat as needed for mouth pain. 01/30/22   Azucena Cecil, PA-C  loperamide (IMODIUM) 2 MG capsule Take 1 capsule (2 mg total) by mouth 4 (four) times daily as needed for diarrhea or loose stools. 12/29/21   Redwine, Madison A, PA-C  pantoprazole (PROTONIX) 20 MG tablet Take 1 tablet (20 mg total) by  mouth daily. 03/17/19   Flossie Buffy, NP  FLUoxetine (PROZAC) 20 MG capsule  09/08/18 03/06/20  [provider]  fluticasone (FLONASE) 50 MCG/ACT nasal spray Place into both nostrils daily. Patient not taking: Reported on 12/17/2019  03/06/20  [provider]  montelukast (SINGULAIR) 10 MG tablet Take 10 mg by mouth at bedtime. Patient not taking: Reported on 12/17/2019  03/06/20  [provider]      Allergies    Patient has no known allergies.    Review of Systems   Review of Systems  Constitutional:  Negative for fever.  HENT:  Positive for congestion and rhinorrhea.   Gastrointestinal:  Negative for abdominal pain, nausea and vomiting.  Genitourinary:  Negative for vaginal bleeding and vaginal discharge.  Neurological:  Positive for headaches.  All other systems reviewed and are negative.   Physical Exam Updated Vital Signs BP 131/79 (BP Location: Right Arm)   Pulse (!) 103   Temp 98.3 F (36.8 C) (Oral)   Resp 16   Ht '5\' 1"'$  (1.549 m)   Wt 64.9 kg   LMP 02/02/2022 (Approximate)   SpO2 100%   BMI 27.02 kg/m  Physical Exam Vitals and nursing note reviewed.  Constitutional:      General: She is not in acute distress.    Appearance: She is not diaphoretic.  HENT:     Head: Normocephalic and atraumatic.  Mouth/Throat:     Mouth: Mucous membranes are moist.     Pharynx: Oropharynx is clear. Uvula midline. No oropharyngeal exudate or posterior oropharyngeal erythema.     Tonsils: No tonsillar exudate.     Comments: Uvula midline without swelling.  Patent airway.  No posterior pharyngeal erythema or tonsillar exudate noted. Eyes:     General: No scleral icterus.    Conjunctiva/sclera: Conjunctivae normal.  Cardiovascular:     Rate and Rhythm: Normal rate and regular rhythm.     Pulses: Normal pulses.     Heart sounds: Normal heart sounds.  Pulmonary:     Effort: Pulmonary effort is normal. No respiratory distress.     Breath sounds:  Normal breath sounds. No wheezing.  Abdominal:     General: Bowel sounds are normal.     Palpations: Abdomen is soft. There is no mass.     Tenderness: There is no abdominal tenderness. There is no guarding or rebound.  Musculoskeletal:        General: Normal range of motion.     Cervical back: Normal range of motion and neck supple.  Skin:    General: Skin is warm and dry.  Neurological:     Mental Status: She is alert.  Psychiatric:        Behavior: Behavior normal.     ED Results / Procedures / Treatments   Labs (all labs ordered are listed, but only abnormal results are displayed) Labs Reviewed  GROUP A STREP BY PCR  RPR  HIV ANTIBODY (ROUTINE TESTING W REFLEX)    EKG None  Radiology No results found.  Procedures Procedures    Medications Ordered in ED Medications - No data to display  ED Course/ Medical Decision Making/ A&P Clinical Course as of 02/09/22 1539  Fri Feb 09, 2022  0954 Notified by RN that patient has to leave to pick up her son.  Discharge paperwork prepared for patient.  Will notify patient if she has positive results.  [SB]    Clinical Course User Index [SB] Antonella Upson A, PA-C                           Medical Decision Making Amount and/or Complexity of Data Reviewed Labs: ordered.   Pt presents with sore throat onset 01/08/2022.  Notes that she had a positive at-home COVID test on 01/09/2022.  Patient afebrile.  On exam patient without acute cardiovascular respiratory exam findings.  Uvula midline without swelling.  Patent airway.  No tonsillar exudate or posterior pharyngeal erythema noted.  Differential diagnosis includes strep pharyngitis, viral pharyngitis, HIV, syphilis.   Labs:  I ordered, and personally interpreted labs.  The pertinent results include:   HIV and RPR ordered with results pending at time of discharge. Strep swab negative   Social Determinants of Health: Patient does not have a primary care provider at this  time  Disposition: Presentation suspicious for viral pharyngitis.  Doubt strep pharyngitis at this time.  Patient has HIV and RPR ordered with results pending at time of discharge. After consideration of the diagnostic results and the patients response to treatment, I feel that the patient would benefit from Discharge home. Supportive care measures and strict return precautions discussed with patient at bedside. Pt acknowledges and verbalizes understanding. Pt appears safe for discharge. Follow up as indicated in discharge paperwork.    This chart was dictated using voice recognition software, Dragon. Despite the best efforts of this  provider to proofread and correct errors, errors may still occur which can change documentation meaning.  Final Clinical Impression(s) / ED Diagnoses Final diagnoses:  Viral pharyngitis    Rx / DC Orders ED Discharge Orders     None         Maybell Misenheimer A, PA-C 02/09/22 1539    Wyvonnia Dusky, MD 02/09/22 1657

## 2022-02-09 NOTE — ED Triage Notes (Signed)
States had covid august 7th. Continues to have sore throat, headaches intermittently since.   States had been tested for HIV 7/28, wants to be restested because shes not sure if she took the test too soon.

## 2022-02-10 LAB — SYPHILIS: RPR W/REFLEX TO RPR TITER AND TREPONEMAL ANTIBODIES, TRADITIONAL SCREENING AND DIAGNOSIS ALGORITHM: RPR Ser Ql: NONREACTIVE

## 2022-03-21 ENCOUNTER — Emergency Department (HOSPITAL_BASED_OUTPATIENT_CLINIC_OR_DEPARTMENT_OTHER)
Admission: EM | Admit: 2022-03-21 | Discharge: 2022-03-21 | Disposition: A | Discharge status: Home / Self Care | Payer: Self-pay | Attending: Emergency Medicine | Admitting: Emergency Medicine

## 2022-03-21 ENCOUNTER — Other Ambulatory Visit: Payer: Self-pay

## 2022-03-21 ENCOUNTER — Emergency Department (HOSPITAL_BASED_OUTPATIENT_CLINIC_OR_DEPARTMENT_OTHER): Payer: Self-pay

## 2022-03-21 ENCOUNTER — Encounter (HOSPITAL_BASED_OUTPATIENT_CLINIC_OR_DEPARTMENT_OTHER): Payer: Self-pay | Admitting: Pediatrics

## 2022-03-21 DIAGNOSIS — N898 Other specified noninflammatory disorders of vagina: Secondary | ICD-10-CM | POA: Insufficient documentation

## 2022-03-21 DIAGNOSIS — R109 Unspecified abdominal pain: Secondary | ICD-10-CM | POA: Insufficient documentation

## 2022-03-21 DIAGNOSIS — R197 Diarrhea, unspecified: Secondary | ICD-10-CM | POA: Insufficient documentation

## 2022-03-21 LAB — COMPREHENSIVE METABOLIC PANEL WITH GFR
ALT: 20 U/L (ref 0–44)
AST: 17 U/L (ref 15–41)
Albumin: 4.5 g/dL (ref 3.5–5.0)
Alkaline Phosphatase: 49 U/L (ref 38–126)
Anion gap: 7 (ref 5–15)
BUN: 9 mg/dL (ref 6–20)
CO2: 23 mmol/L (ref 22–32)
Calcium: 8.9 mg/dL (ref 8.9–10.3)
Chloride: 107 mmol/L (ref 98–111)
Creatinine, Ser: 0.58 mg/dL (ref 0.44–1.00)
GFR, Estimated: >60 mL/min
Glucose, Bld: 96 mg/dL (ref 70–99)
Potassium: 3.8 mmol/L (ref 3.5–5.1)
Sodium: 137 mmol/L (ref 135–145)
Total Bilirubin: 0.3 mg/dL (ref 0.3–1.2)
Total Protein: 7.8 g/dL (ref 6.5–8.1)

## 2022-03-21 LAB — WET PREP, GENITAL
Clue Cells Wet Prep HPF POC: NONE SEEN
Sperm: NONE SEEN
Trich, Wet Prep: NONE SEEN
WBC, Wet Prep HPF POC: <10
Yeast Wet Prep HPF POC: NONE SEEN

## 2022-03-21 LAB — CBC
HCT: 35.4 % — ABNORMAL LOW (ref 36.0–46.0)
Hemoglobin: 11.7 g/dL — ABNORMAL LOW (ref 12.0–15.0)
MCH: 29.7 pg (ref 26.0–34.0)
MCHC: 33.1 g/dL (ref 30.0–36.0)
MCV: 89.8 fL (ref 80.0–100.0)
Platelets: 296 10*3/uL (ref 150–400)
RBC: 3.94 MIL/uL (ref 3.87–5.11)
RDW: 13.9 % (ref 11.5–15.5)
WBC: 5.5 10*3/uL (ref 4.0–10.5)
nRBC: 0 % (ref 0.0–0.2)

## 2022-03-21 LAB — URINALYSIS, ROUTINE W REFLEX MICROSCOPIC
Bilirubin Urine: NEGATIVE
Glucose, UA: NEGATIVE mg/dL
Ketones, ur: NEGATIVE mg/dL
Leukocytes,Ua: NEGATIVE
Nitrite: NEGATIVE
Protein, ur: NEGATIVE mg/dL
Specific Gravity, Urine: >=1.03 (ref 1.005–1.030)
pH: 5.5 (ref 5.0–8.0)

## 2022-03-21 LAB — URINALYSIS, MICROSCOPIC (REFLEX)

## 2022-03-21 LAB — LIPASE, BLOOD: Lipase: 30 U/L (ref 11–51)

## 2022-03-21 LAB — PREGNANCY, URINE: Preg Test, Ur: NEGATIVE

## 2022-03-21 MED ORDER — LOPERAMIDE HCL 2 MG PO CAPS: 2.0000 mg | ORAL_CAPSULE | Freq: Four times a day (QID) | ORAL | 0 refills | Status: DC | PRN

## 2022-03-21 MED ORDER — AZITHROMYCIN 250 MG PO TABS: 250.0000 mg | ORAL_TABLET | Freq: Every day | ORAL | 0 refills | Status: DC

## 2022-03-21 MED ORDER — IOHEXOL 300 MG/ML  SOLN
100.0000 mL | Freq: Once | INTRAMUSCULAR | Status: AC | PRN
  Administered 2022-03-21: 100 mL via INTRAVENOUS

## 2022-03-21 MED ORDER — FLUCONAZOLE 150 MG PO TABS: 150.0000 mg | ORAL_TABLET | Freq: Once | ORAL | 0 refills | Status: AC

## 2022-03-21 MED ORDER — SODIUM CHLORIDE 0.9 % IV SOLN
1.0000 g | Freq: Once | INTRAVENOUS | Status: AC
  Administered 2022-03-21: 1 g via INTRAVENOUS
  Filled 2022-03-21: qty 10

## 2022-03-21 NOTE — ED Notes (Signed)
ED Provider at bedside. 

## 2022-03-21 NOTE — Discharge Instructions (Addendum)
You were seen in the emergency department today for diarrhea and vaginal discharge.  As we discussed your lab testing and CT imaging all looked reassuring today.  I am giving you a prescription diarrheal medicine that you can take 1 capsule by mouth up to 4 times daily as needed.  I have given you the antibiotic for possible gonorrhea, and given you a written prescription for the antibiotic for chlamydia.  Please only fill and take this if your chlamydia test is positive.  With concern for getting a yeast infection, I have also sent a prescription for an antifungal.  You can fill this if you start developing any symptoms of yeast infection.  I have sent a referral to the gastroenterologist for you.  It is important you follow-up with them about your diarrhea.  It is possible you could have irritable bowel syndrome, this is a diagnosis of exclusion.

## 2022-03-21 NOTE — ED Triage Notes (Signed)
C/O diarrhea on and off x 2 months along w/ abdominal discomfort; c/o vaginal irritation and discharge x  2 weeks;

## 2022-03-21 NOTE — ED Provider Notes (Signed)
[facility] DEPART[facility] Provider Note   CSN: [id number] Arrival date & time: 03/21/22  1123     History  Chief Complaint  Patient presents with   Diarrhea   Abdominal Pain   Vaginal Discharge    Ashley Cole is a 36 y.o. female with history of anemia, HPV, GERD who presents the emergency department complaining of diarrhea for the past 2 months, and vaginal discharge for the past 2 weeks.  Patient states that initially her diarrhea was intermittent, but for the past 2 weeks it has been consistent.  She is having about 4-5 episodes of diarrhea daily.  Sometimes has some mild abdominal pain, but is not severe for her.  She does not notice any aggravating or alleviating factors.  Has not taken any medications for this.  Has concerns for STDs or possible yeast infection today, she has been having thick white discharge and significant irritation for 2 weeks.  Is currently going through a separation from her partner. No recent travel, no new medications or foods.   Diarrhea Associated symptoms: abdominal pain   Associated symptoms: no chills, no fever and no vomiting   Abdominal Pain Associated symptoms: diarrhea and vaginal discharge   Associated symptoms: no chills, no dysuria, no fever, no nausea and no vomiting   Vaginal Discharge Associated symptoms: abdominal pain   Associated symptoms: no dysuria, no fever, no nausea and no vomiting        Home Medications Prior to Admission medications   Medication Sig Start Date End Date Taking? Authorizing Provider  azithromycin (ZITHROMAX) 250 MG tablet Take 1 tablet (250 mg total) by mouth daily. Take first 2 tablets together, then 1 every day until finished. 03/21/22  Yes Autry Droege T, PA-C  fluconazole (DIFLUCAN) 150 MG tablet Take 1 tablet (150 mg total) by mouth once for 1 dose. Repeat after one week if symptoms persist 03/21/22 03/21/22 Yes Dodi Leu T, PA-C  loperamide (IMODIUM) 2 MG capsule Take 1  capsule (2 mg total) by mouth 4 (four) times daily as needed for diarrhea or loose stools. 03/21/22  Yes Ailine Hefferan T, PA-C  acetaminophen (TYLENOL) 500 MG tablet Take 1 tablet (500 mg total) by mouth every 6 (six) hours as needed. 08/13/19   Nche, Charlene Brooke, NP  albuterol (PROVENTIL HFA;VENTOLIN HFA) 108 (90 Base) MCG/ACT inhaler Inhale 2 puffs into the lungs every 6 (six) hours as needed for wheezing or shortness of breath.    [provider]  benzonatate (TESSALON) 100 MG capsule Take 1 capsule (100 mg total) by mouth every 8 (eight) hours. 12/29/21   Redwine, Madison A, PA-C  IRON PO iron    [provider]  lidocaine (XYLOCAINE) 2 % solution Use as directed 15 mLs in the mouth or throat as needed for mouth pain. 01/30/22   Azucena Cecil, PA-C  pantoprazole (PROTONIX) 20 MG tablet Take 1 tablet (20 mg total) by mouth daily. 03/17/19   Flossie Buffy, NP  FLUoxetine (PROZAC) 20 MG capsule  09/08/18 03/06/20  [provider]  fluticasone (FLONASE) 50 MCG/ACT nasal spray Place into both nostrils daily. Patient not taking: Reported on 12/17/2019  03/06/20  [provider]  montelukast (SINGULAIR) 10 MG tablet Take 10 mg by mouth at bedtime. Patient not taking: Reported on 12/17/2019  03/06/20  [provider]      Allergies    Patient has no known allergies.    Review of Systems   Review of Systems  Constitutional:  Negative for chills and fever.  Gastrointestinal:  Positive for abdominal pain and diarrhea. Negative for blood in stool, nausea and vomiting.  Genitourinary:  Positive for vaginal discharge. Negative for dysuria and pelvic pain.  All other systems reviewed and are negative.   Physical Exam Updated Vital Signs BP 128/84 (BP Location: Right Arm)   Pulse 87   Temp 99.2 F (37.3 C) (Oral)   Resp 18   Ht '5\' 1"'$  (1.549 m)   Wt 61.1 kg   LMP 03/01/2022 Comment: neg upreg in er today.  SpO2 100%   BMI 25.45 kg/m   Physical Exam Vitals and nursing note reviewed. Exam conducted with a chaperone present.  Constitutional:      Appearance: Normal appearance.  HENT:     Head: Normocephalic and atraumatic.  Eyes:     Conjunctiva/sclera: Conjunctivae normal.  Cardiovascular:     Rate and Rhythm: Normal rate and regular rhythm.  Pulmonary:     Effort: Pulmonary effort is normal. No respiratory distress.     Breath sounds: Normal breath sounds.  Abdominal:     General: There is no distension.     Palpations: Abdomen is soft.     Tenderness: There is no abdominal tenderness.  Genitourinary:    General: Normal vulva.     Exam position: Lithotomy position.     Vagina: Normal.     Cervix: Discharge present. No cervical motion tenderness or erythema.     Uterus: Normal.      Adnexa: Right adnexa normal and left adnexa normal.  Skin:    General: Skin is warm and dry.  Neurological:     General: No focal deficit present.     Mental Status: She is alert.     ED Results / Procedures / Treatments   Labs (all labs ordered are listed, but only abnormal results are displayed) Labs Reviewed  CBC - Abnormal; Notable for the following components:      Result Value   Hemoglobin 11.7 (*)    HCT 35.4 (*)    All other components within normal limits  URINALYSIS, ROUTINE W REFLEX MICROSCOPIC - Abnormal; Notable for the following components:   Hgb urine dipstick LARGE (*)    All other components within normal limits  URINALYSIS, MICROSCOPIC (REFLEX) - Abnormal; Notable for the following components:   Bacteria, UA FEW (*)    All other components within normal limits  WET PREP, GENITAL  LIPASE, BLOOD  COMPREHENSIVE METABOLIC PANEL  PREGNANCY, URINE  GC/CHLAMYDIA PROBE AMP ([place]) NOT AT [facility]    EKG None  Radiology CT ABDOMEN PELVIS W CONTRAST  Result Date: 03/21/2022 CLINICAL DATA:  36 year old female with abdominal pain and diarrhea for 2 months. EXAM: CT ABDOMEN AND PELVIS WITH CONTRAST  TECHNIQUE: Multidetector CT imaging of the abdomen and pelvis was performed using the standard protocol following bolus administration of intravenous contrast. RADIATION DOSE REDUCTION: This exam was performed according to the departmental dose-optimization program which includes automated exposure control, adjustment of the mA and/or kV according to patient size and/or use of iterative reconstruction technique. CONTRAST:  152m OMNIPAQUE IOHEXOL 300 MG/ML  SOLN COMPARISON:  Noncontrast CT Abdomen and Pelvis 01/08/2017. FINDINGS: Lower chest: Negative. Hepatobiliary: Questionable hepatic steatosis. Otherwise negative liver and gallbladder. No bile duct enlargement. Pancreas: Negative. Spleen: Negative. Adrenals/Urinary Tract: Normal adrenal glands. Kidneys appear within normal limits, symmetric renal enhancement. No nephrolithiasis or renal inflammation identified. Decompressed ureters. Diminutive and unremarkable bladder. Incidental left hemipelvis phleboliths series 2,  image 70. Stomach/Bowel: Unremarkable large bowel. Normal retrocecal appendix on series 2, image 49. No large bowel wall thickening or inflammation. No dilated small bowel. Terminal ileum appears similar to other small bowel loops and within normal limits. Stomach and duodenum appear negative. No free air, free fluid, or mesenteric inflammation identified. Vascular/Lymphatic: Major arterial structures in the abdomen and pelvis appear patent and normal. Portal venous system appears patent. Central venous structures in the abdomen and pelvis also appear patent. No lymphadenopathy. Reproductive: Within normal limits. Other: No pelvic free fluid. Musculoskeletal: Negative. IMPRESSION: Negative CT Abdomen and Pelvis aside from questionable hepatic steatosis. No bowel inflammation identified. Normal appendix. Electronically Signed   By: Genevie Ann M.D.   On: 03/21/2022 13:43    Procedures Procedures    Medications Ordered in ED Medications   cefTRIAXone (ROCEPHIN) 1 g in sodium chloride 0.9 % 100 mL IVPB (1 g Intravenous New Bag/Given 03/21/22 1433)  iohexol (OMNIPAQUE) 300 MG/ML solution 100 mL (100 mLs Intravenous Contrast Given 03/21/22 1322)    ED Course/ Medical Decision Making/ A&P                           Medical Decision Making Amount and/or Complexity of Data Reviewed Labs: ordered. Radiology: ordered.  Risk Prescription drug management.  This patient is a 36 y.o. female  who presents to the ED for concern of diarrhea x 2 months, vaginal discharge x 2 weeks.   Differential diagnoses prior to evaluation: The emergent differential diagnosis includes, but is not limited to,  Infectious causes such as: Viral, Bacterial, Parasitic; Toxin. Noninfectious causes such as: GI Bleed, Appendicitis, Mesenteric Ischemia, Diverticulitis, endocrine causes (adrenal, thyroid), Toxicologic exposures, Drug-associated, IBD, STD, BV, yeast infection. This is not an exhaustive differential.   Past Medical History / Co-morbidities: anemia, HPV, GERD  Physical Exam: Physical exam performed. The pertinent findings include: Normal vital signs.  No acute distress.  Abdomen soft nontender.  GU exam with some thin white vaginal discharge, but otherwise grossly remarkable.  Chaperone present for exam.  Lab Tests/Imaging studies: I personally interpreted labs/imaging and the pertinent results include: CBC and CMP unremarkable.  Normal lipase.  Urinalysis with large hemoglobin, patient reports history of this.  Negative pregnancy.  Wet prep negative.  GC pending.  CT abdomen pelvis with no acute intra-abdominal findings. I agree with the radiologist interpretation.  Medications: I ordered medication including empiric antibiotic for gonorrhea.  I have reviewed the patients home medicines and have made adjustments as needed.   Disposition: After consideration of the diagnostic results and the patients response to treatment, I feel that  emergency department workup does not suggest an emergent condition requiring admission or immediate intervention beyond what has been performed at this time. The plan is: Discharge to home with Imodium, azithromycin for possible concomitant chlamydia infection, fluconazole as patient says she gets yeast infections when taking antibiotics.  If also sent a referral for gastroenterology.  Low concern for infectious etiology is symptoms have been going on for 2 months, and she otherwise appears clinically well with reassuring lab testing.  The patient is safe for discharge and has been instructed to return immediately for worsening symptoms, change in symptoms or any other concerns.   Final Clinical Impression(s) / ED Diagnoses Final diagnoses:  Diarrhea, unspecified type  Vaginal discharge    Rx / DC Orders ED Discharge Orders          Ordered    Ambulatory referral  to Gastroenterology        03/21/22 1423    loperamide (IMODIUM) 2 MG capsule  4 times daily PRN        03/21/22 1501    fluconazole (DIFLUCAN) 150 MG tablet   Once        03/21/22 1501    azithromycin (ZITHROMAX) 250 MG tablet  Daily       Note to Pharmacy: Only fill if chlamydia test is positive   03/21/22 1501           Portions of this report may have been transcribed using voice recognition software. Every effort was made to ensure accuracy; however, inadvertent computerized transcription errors may be present.    Estill Cotta 03/21/22 1511    Margette Fast, MD 03/22/22 1130

## 2022-03-22 LAB — GC/CHLAMYDIA PROBE AMP (~~LOC~~) NOT AT ARMC
Chlamydia: NEGATIVE
Comment: NEGATIVE
Comment: NORMAL
Neisseria Gonorrhea: NEGATIVE

## 2022-04-11 ENCOUNTER — Encounter: Payer: Self-pay | Admitting: Obstetrics and Gynecology

## 2022-04-11 ENCOUNTER — Ambulatory Visit (INDEPENDENT_AMBULATORY_CARE_PROVIDER_SITE_OTHER): Payer: Self-pay | Admitting: Obstetrics and Gynecology

## 2022-04-11 VITALS — BP 134/71 | HR 113 | Ht 61.0 in | Wt 140.7 lb

## 2022-04-11 DIAGNOSIS — N939 Abnormal uterine and vaginal bleeding, unspecified: Secondary | ICD-10-CM

## 2022-04-11 DIAGNOSIS — Z01419 Encounter for gynecological examination (general) (routine) without abnormal findings: Secondary | ICD-10-CM

## 2022-04-11 LAB — POCT URINALYSIS DIP (DEVICE)
Bilirubin Urine: NEGATIVE
Glucose, UA: NEGATIVE mg/dL
Ketones, ur: NEGATIVE mg/dL
Leukocytes,Ua: NEGATIVE
Nitrite: NEGATIVE
Protein, ur: NEGATIVE mg/dL
Specific Gravity, Urine: 1.015 (ref 1.005–1.030)
Urobilinogen, UA: 0.2 mg/dL (ref 0.0–1.0)
pH: 7 (ref 5.0–8.0)

## 2022-04-11 NOTE — Progress Notes (Unsigned)
ANNUAL EXAM Patient name: Ashley Cole MRN [id number]  Date of birth: [birth year] Chief Complaint:   vaginal dryness  History of Present Illness:   Ashley Cole is a 36 y.o. [id number] being seen today for a routine annual exam.   Was sent by ED due to concern for pre-menopausal. Reports occasional hot and cold flashes; reports mother had menopause early. Having menses that are abnormal - heavy for 2 days, feels they are unpredictable. Using 2 pads or a tampon and a pad that started in April. Vaginal dryness started in July. A little bit of cramping with menses, and entire menses is 2-3 days in total. Pain with intercourse more recently due to dryness with occasional lubrication use. Last delivery nearly 10 years ago. Feeling like her skin has been dry.   Wondering if she has thyroid issues due to . Feels eyebrow hair won't grow - got tattoo for hair - which has been like that for years.   Reports having vulvar itching for some time . Has been air drying clothes after dryer broke. Swtiched to sensitive soap more recently.   Reports decreased desire for sexual activity with her husband - they have had trust and communication issues. She feels that    [facility] urology for chronic UTIs due to hematuria.    Patient's last menstrual period was 03/01/2022.  Last pap unk Last mammogram: n/a Last colonoscopy: n/a     12/30/2017    2:50 PM 02/26/2017    5:33 PM 05/03/2016    2:17 PM  Depression screen PHQ 2/9  Decreased Interest 2 2 0  Down, Depressed, Hopeless 2 2 0  PHQ - 2 Score 4 4 0  Altered sleeping 3 2   Tired, decreased energy 2 3   Change in appetite 3 1   Feeling bad or failure about yourself  2 0   Trouble concentrating 0 0   Moving slowly or fidgety/restless 1 0   Suicidal thoughts 0 0   PHQ-9 Score 15 10         12/30/2017    2:52 PM 02/26/2017    5:32 PM  GAD 7 : Generalized Anxiety Score  Nervous, Anxious, on Edge 2 0  Control/stop worrying 3 3  Worry too much -  different things 3 3  Trouble relaxing 2 2  Restless 0 0  Easily annoyed or irritable 2 2  Afraid - awful might happen 2 1  Total GAD 7 Score 14 11     Review of Systems:   Pertinent items are noted in HPI Denies any headaches, blurred vision, fatigue, shortness of breath, chest pain, abdominal pain, abnormal vaginal discharge/itching/odor/irritation, problems with periods, bowel movements, urination, or intercourse unless otherwise stated above. Pertinent History Reviewed:  Reviewed past medical,surgical, social and family history.  Reviewed problem list, medications and allergies. Physical Assessment:   Vitals:   04/11/22 1358  BP: 134/71  Pulse: (!) 113  Weight: 140 lb 11.2 oz (63.8 kg)  Height: '5\' 1"'$  (1.549 m)  Body mass index is 26.59 kg/m.        Physical Examination:   General appearance - well appearing, and in no distress  Mental status - alert, oriented to person, place, and time  Psych:  She has a normal mood and affect  Skin - warm and dry, normal color, no suspicious lesions noted  Chest - effort normal, all lung fields clear to auscultation bilaterally  Heart - normal rate and regular rhythm  Breasts -  breasts appear normal, no suspicious masses, no skin or nipple changes or  axillary nodes  Abdomen - soft, nontender, nondistended, no masses or organomegaly  Pelvic -  VULVA: normal appearing vulva with no masses, tenderness or lesion Superfical pelvic floor tenderenss nontender Minimal pelvic floor tenderness   Extremities:  No swelling or varicosities noted  Chaperone present for exam  No results found for this or any previous visit (from the past 24 hour(s)).   @ Assessment & Plan:   1. Abnormal uterine bleeding (AUB) Additional labs for workup collected. Will followup ultrasound - as part of AUB workup. TSH due to reported skin changes and AUB. Has mild anemia , normal chemistry and recent negative wet prep.  - TSH - US PELVIC COMPLETE WITH  TRANSVAGINAL; Future - POCT urinalysis dip (device) - Ambulatory referral to [facility]  2. Well woman exam with routine gynecological exam Due for pap - will call for free pap due to being self-pay. Discussed importance of [place] for sexual pleasure. Pt would greatly prefer couples' counseling but is open to individual counseling here as well. Referral to integrated [facility] placed. Given resource regarding sexual wellness  - Multiple Vitamins-Minerals (WOMENS MULTIVITAMIN PO); Take 1 tablet by mouth daily. - POCT urinalysis dip (device)  No orders of the defined types were placed in this encounter.   Meds: No orders of the defined types were placed in this encounter.   Follow-up: No follow-ups on file.  Darliss Cheney, MD 04/11/2022 2:15 PM

## 2022-04-11 NOTE — Patient Instructions (Addendum)
Free Pap [phone]    Dr. Janene Madeira  - [email or web address]   [email or web address]    Use of lubrication.

## 2022-04-12 LAB — TSH: TSH: 0.543 u[IU]/mL (ref 0.450–4.500)

## 2022-04-16 ENCOUNTER — Encounter: Payer: Self-pay | Admitting: Obstetrics & Gynecology

## 2022-09-10 ENCOUNTER — Ambulatory Visit (HOSPITAL_COMMUNITY)
Admission: EM | Admit: 2022-09-10 | Discharge: 2022-09-10 | Disposition: A | Discharge status: Home / Self Care | Payer: 59 | Attending: Psychiatry | Admitting: Psychiatry

## 2022-09-10 DIAGNOSIS — F22 Delusional disorders: Secondary | ICD-10-CM | POA: Insufficient documentation

## 2022-09-10 DIAGNOSIS — R440 Auditory hallucinations: Secondary | ICD-10-CM
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
## 2022-09-10 NOTE — ED Notes (Signed)
Patient discharged by Vernard Gambles, NP. Patient refused to take AVS.

## 2022-09-10 NOTE — Discharge Instructions (Signed)
Based on what you have shared, a list of resources for outpatient therapy and psychiatry is provided below to get you started back on treatment.  It is imperative that you follow through with treatment within 5-7 days from the day of discharge to prevent any further risk to your safety or mental well-being.  You are not limited to the list provided.  In case of an urgent crisis, you may contact the Mobile Crisis Unit with [facility] at [phone].        Outpatient Services for Therapy and Medication Management for Medicaid  [facility] [address] [place], [place], [address] [phone] phone  New Patient Assessment/Therapy Walk-ins Monday and Wednesday: 8am until slots are full. Every 1st and 2nd Friday: 1pm - 5pm  NO ASSESSMENT/THERAPY WALK-INS ON TUESDAYS OR THURSDAYS  New Patient Psychiatry/Medication Management Walk-ins Monday-Friday: 8am-11am  For all walk-ins, we ask that you arrive by 7:30am because patient will be seen in the order of arrival.  Availability is limited; therefore, you may not be seen on the same day that you walk-in.  Our goal is to serve and meet the needs of our community to the best of our ability.   [facility] [address] [place], [place], [address] [phone] phone  [facility] [address] [place], [place], [address] [phone] phone [phone] phone (Aetna, AmeriHealth, Anthem/Elevance, BCBS, Centivo, Cigna/Evernorth, ComPsych, Healthy Blue, Medicaid, Optum, [facility], The Alliance, UMR, UHC, Vaya Health, WellCare, Out of Network)  [facility] [address] [place], [place], [address] [phone] phone (Aetna, Anthem/Elevance, Beacon Health Options/Carelon, BCBS, [facility], Humana, Magellan, MedCost, Medicaid, Medicare, MultiPlan, Tricare, UMR, UHC)  [facility] [address] [place], [place], [address] [phone] phone (Medicaid, ask about other insurance)  [facility] [address] [place], [place], [address] [phone] phone [phone] fax (Aetna, BCBS , Cigna, Medicaid, [place] Health Choice, UHC, TRICARE, Self-Pay)  [facility] [address] [place], [place], [address] [phone] phone (Aetna, Anthem/Elevance, BCBS, [facility], Cigna/Evernorth, Health Choice, Humana, MedCost, Medicaid, Medicare, Optum, Tricare, UMR, UHC)  [facility] - 6-8 MONTH WAIT FOR THERAPY; SOONER FOR MEDICATION MANAGEMENT [address] [place], [place], [address] [phone] phone (Aetna, AmeriHealth [facility] [place], BCBS, Cigna, Evernorth, Friday Health Plans, Gateway Health, BCBS Healthy Blue, Humana, Magellan Health, Medcost, Medicare, Medicaid, Optum, Tricare, UHC, UHC Community Plan, Wellcare)  [facility] [address] [place], [place], [address] [phone] phone  [facility] [address] [place], [place], [address] [phone] phone  [facility] [address] [place], [place], [address] [phone] phone  [facility] - THERAPY ONLY [address] [place], [place], [address] [phone] phone  [facility] [address] [place], [place], [address] [phone] phone  [facility] [address] [place], [place], [address] [phone] phone [phone] fax  [facility] [address] [place], [place], [address] [phone] phone [phone] fax  [facility] [address] [place], [place], [address] [phone] phone  [facility] [address] [place], , [address]  [phone] phone  [facility]  (Adults Only) [address] [place], ,  [address]  [phone] phone [phone] fax  
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
## 2022-09-12 ENCOUNTER — Encounter (HOSPITAL_COMMUNITY): Payer: Self-pay | Admitting: Behavioral Health

## 2022-09-12 ENCOUNTER — Telehealth (HOSPITAL_COMMUNITY): Payer: Self-pay | Admitting: Emergency Medicine

## 2022-09-12 ENCOUNTER — Emergency Department (HOSPITAL_COMMUNITY): Payer: 59

## 2022-09-12 ENCOUNTER — Other Ambulatory Visit: Payer: Self-pay

## 2022-09-12 ENCOUNTER — Emergency Department (HOSPITAL_COMMUNITY)
Admission: EM | Admit: 2022-09-12 | Discharge: 2022-09-13 | Disposition: A | Discharge status: Home / Self Care | Payer: 59 | Attending: Emergency Medicine | Admitting: Emergency Medicine

## 2022-09-12 ENCOUNTER — Ambulatory Visit (HOSPITAL_COMMUNITY)
Admission: EM | Admit: 2022-09-12 | Discharge: 2022-09-12 | Disposition: A | Discharge status: Other Acute Inpatient Hospital | Payer: 59

## 2022-09-12 DIAGNOSIS — Z1152 Encounter for screening for COVID-19: Secondary | ICD-10-CM | POA: Insufficient documentation

## 2022-09-12 DIAGNOSIS — S0990XA Unspecified injury of head, initial encounter: Secondary | ICD-10-CM | POA: Diagnosis not present

## 2022-09-12 DIAGNOSIS — Z01818 Encounter for other preprocedural examination: Secondary | ICD-10-CM | POA: Diagnosis not present

## 2022-09-12 DIAGNOSIS — E876 Hypokalemia: Secondary | ICD-10-CM | POA: Diagnosis not present

## 2022-09-12 DIAGNOSIS — F23 Brief psychotic disorder: Secondary | ICD-10-CM | POA: Diagnosis not present

## 2022-09-12 DIAGNOSIS — F900 Attention-deficit hyperactivity disorder, predominantly inattentive type: Secondary | ICD-10-CM | POA: Diagnosis not present

## 2022-09-12 DIAGNOSIS — F29 Unspecified psychosis not due to a substance or known physiological condition: Secondary | ICD-10-CM | POA: Insufficient documentation

## 2022-09-12 DIAGNOSIS — R9431 Abnormal electrocardiogram [ECG] [EKG]: Secondary | ICD-10-CM | POA: Diagnosis not present

## 2022-09-12 LAB — CBC WITH DIFFERENTIAL/PLATELET
Abs Immature Granulocytes: 0.01 10*3/uL (ref 0.00–0.07)
Basophils Absolute: 0 10*3/uL (ref 0.0–0.1)
Basophils Relative: 0 %
Eosinophils Absolute: 0 10*3/uL (ref 0.0–0.5)
Eosinophils Relative: 1 %
HCT: 36.9 % (ref 36.0–46.0)
Hemoglobin: 12.4 g/dL (ref 12.0–15.0)
Immature Granulocytes: 0 %
Lymphocytes Relative: 37 %
Lymphs Abs: 2.3 10*3/uL (ref 0.7–4.0)
MCH: 30 pg (ref 26.0–34.0)
MCHC: 33.6 g/dL (ref 30.0–36.0)
MCV: 89.3 fL (ref 80.0–100.0)
Monocytes Absolute: 0.6 10*3/uL (ref 0.1–1.0)
Monocytes Relative: 10 %
Neutro Abs: 3.3 10*3/uL (ref 1.7–7.7)
Neutrophils Relative %: 52 %
Platelets: 306 10*3/uL (ref 150–400)
RBC: 4.13 MIL/uL (ref 3.87–5.11)
RDW: 12.8 % (ref 11.5–15.5)
WBC: 6.4 10*3/uL (ref 4.0–10.5)
nRBC: 0 % (ref 0.0–0.2)

## 2022-09-12 LAB — COMPREHENSIVE METABOLIC PANEL
ALT: 20 U/L (ref 0–44)
AST: 18 U/L (ref 15–41)
Albumin: 4.5 g/dL (ref 3.5–5.0)
Alkaline Phosphatase: 46 U/L (ref 38–126)
Anion gap: 10 (ref 5–15)
BUN: 6 mg/dL (ref 6–20)
CO2: 25 mmol/L (ref 22–32)
Calcium: 9.4 mg/dL (ref 8.9–10.3)
Chloride: 101 mmol/L (ref 98–111)
Creatinine, Ser: 0.74 mg/dL (ref 0.44–1.00)
GFR, Estimated: >60 mL/min (ref >60)
Glucose, Bld: 102 mg/dL — ABNORMAL HIGH (ref 70–99)
Potassium: 3.1 mmol/L — ABNORMAL LOW (ref 3.5–5.1)
Sodium: 136 mmol/L (ref 135–145)
Total Bilirubin: 0.9 mg/dL (ref 0.3–1.2)
Total Protein: 7.8 g/dL (ref 6.5–8.1)

## 2022-09-12 LAB — ETHANOL: Alcohol, Ethyl (B): <10 mg/dL (ref <10)

## 2022-09-12 LAB — SALICYLATE LEVEL: Salicylate Lvl: <7 mg/dL — ABNORMAL LOW (ref 7.0–30.0)

## 2022-09-12 LAB — ACETAMINOPHEN LEVEL: Acetaminophen (Tylenol), Serum: <10 ug/mL — ABNORMAL LOW (ref 10–30)

## 2022-09-12 LAB — I-STAT BETA HCG BLOOD, ED (MC, WL, AP ONLY): I-stat hCG, quantitative: <5 m[IU]/mL (ref <5)

## 2022-09-12 MED ORDER — POTASSIUM CHLORIDE CRYS ER 20 MEQ PO TBCR
40.0000 meq | EXTENDED_RELEASE_TABLET | Freq: Once | ORAL | Status: AC
  Administered 2022-09-12: 40 meq via ORAL
  Filled 2022-09-12: qty 2

## 2022-09-12 NOTE — ED Notes (Signed)
[employer] arrived to pick patient up for transfer. Paperwork provided and checked by the officer. Informed patient that she was going to [facility] ER d/t the wait time was over 4 hours at [facility]. Patient in agreement. Safety maintained. No personal belongings to be returned.

## 2022-09-12 NOTE — BH Assessment (Signed)
Disposition: Per Erskine Emery NP, patient is recommended for inpatient treatment. No [facility] beds per [facility] Lorin Glass, RN). Patient referred to the following hospitals for consideration of inpatient treatment.   Destination  Service Provider Request Status Selected Services Address Phone Fax Patient Preferred  [facility]  Pending - Request Sent N/A [address] [place] [address] [phone] --  [facility]  Pending - Request Sent N/A [address], [place] [address] [phone] --  [facility]  Pending - Request Sent N/A [address] [place] [address] [phone] --  [facility]  Pending - Request Sent N/A [phone] [address] [place] [address] [phone] --  [facility]  Pending - Request Sent N/A [address] [place], [place] [address] [phone] --  [facility]  Pending - Request Sent N/A [address] [place] [address] [phone] --  [facility]  Pending - Request Sent N/A [address] [place] [address] [phone] --  [facility]  Pending - Request Sent N/A [address], [place] [address] [phone] --  [facility]  Pending - Request Sent N/A [address] [place] [address] [phone] --  [facility]  Pending - Request Sent N/A [address] [place] [address] [phone] --  [facility]  Pending - Request Sent N/A [name] [place] [address] [phone] --  [facility]  Pending - Request Sent N/A [address], [place] [address] [phone] --  [facility]  Pending - Request Sent N/A [address], [place] [address] [phone] --  [facility]  Pending - Request Sent N/A [address] [place] [address] [phone] --  [facility]  Pending - Request Sent N/A [address], [place] [address] [phone] --  [facility]  Pending - Request Sent N/A [address] [place] [address] [phone] --  [facility]  Pending - Request Sent N/A [address], [place] [address] [phone] --  [facility]  Pending - Request Sent N/A [address] [place], [place] [address] [phone] --  [facility]  Pending - Request Sent N/A [address], [place] [address] [phone] --  [facility]  Pending - Request Sent N/A [address] [place] [address] [phone] --  [facility]  Pending - Request Sent N/A [address] [place], [place] [address] [phone] --  [facility]  Pending - Request Sent N/A [address] [place], [place] [address] [phone] --  [facility]  Pending - Request Sent N/A [address] [place] [address] [phone] --  [facility]  Pending - Request Sent N/A [address], [place] [address] [phone] --  [facility]  Pending - Request Sent N/A [address] [place] [address] [phone] --

## 2022-09-12 NOTE — BH Assessment (Addendum)
Disposition:  Per Erskine Emery NP, patient is recommended for inpatient treatment. No appropriate beds at [facility]. Therefore, this Clinician was advised to fax patient out to appropriate hospitals for consideration of placement.   Received a call from clinical staff "Delaney Meigs" with [facility]. Patient accepted to their facility for admission, 09/13/2022 (after 8 am). The accepting provider is Tyrone Apple, MD. Nurse report [phone]. Patient's nurse Vonna Kotyk, RN) and [facility] provider Erskine Emery, NP) provided disposition updates.
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
## 2022-09-12 NOTE — ED Notes (Signed)
Report called to Sheria Lang RN at [facility] and [employer] called for transportation. Three copies of IVC paperwork and EMTALA placed in envelope for transfer at time of discharge. Patients mom called with update on change of ER destination.
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
## 2022-09-12 NOTE — ED Notes (Addendum)
Report attempted to [facility]; was told the nurses are currently in report and I was asked to call back at [phone].
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
## 2022-09-12 NOTE — ED Notes (Signed)
Pt belongings placed in 1 belonging bag and put in hall c cabinet

## 2022-09-12 NOTE — Progress Notes (Signed)
   09/12/22 1345  [facility] Triage Screening (Walk-ins at [facility] only)  How Did You Hear About [place]? Family/Friend  What Is the Reason for Your Visit/Call Today? IVC for parnaoid delusions  How Long Has This Been Causing You Problems? 1-6 months  Have You Recently Had Any Thoughts About Hurting Yourself? No  Are You Planning to Commit Suicide/Harm Yourself At This time? No  Have you Recently Had Thoughts About Hurting Someone [name]? No  Are You Planning To Harm Someone At This Time? No  Are you currently experiencing any auditory, visual or other hallucinations? No  Have You Used Any Alcohol or Drugs in the Past 24 Hours? No  Do you have any current medical co-morbidities that require immediate attention? No  Clinician description of patient physical appearance/behavior: agitated and irritabel  What Do You Feel Would Help You the Most Today? Treatment for Depression or other mood problem  If access to [facility] was not available, would you have sought care in the Emergency Department? No  Determination of Need Urgent (48 hours)  Options For Referral Medication Management;Outpatient Therapy;Inpatient Hospitalization
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
## 2022-09-13 DIAGNOSIS — Z62810 Personal history of physical and sexual abuse in childhood: Secondary | ICD-10-CM | POA: Diagnosis not present

## 2022-09-13 DIAGNOSIS — K219 Gastro-esophageal reflux disease without esophagitis: Secondary | ICD-10-CM | POA: Diagnosis not present

## 2022-09-13 DIAGNOSIS — F1010 Alcohol abuse, uncomplicated: Secondary | ICD-10-CM | POA: Diagnosis not present

## 2022-09-13 DIAGNOSIS — F900 Attention-deficit hyperactivity disorder, predominantly inattentive type: Secondary | ICD-10-CM | POA: Diagnosis not present

## 2022-09-13 DIAGNOSIS — Z5901 Sheltered homelessness: Secondary | ICD-10-CM | POA: Diagnosis not present

## 2022-09-13 DIAGNOSIS — F17210 Nicotine dependence, cigarettes, uncomplicated: Secondary | ICD-10-CM | POA: Diagnosis not present

## 2022-09-13 DIAGNOSIS — A630 Anogenital (venereal) warts: Secondary | ICD-10-CM | POA: Diagnosis not present

## 2022-09-13 DIAGNOSIS — Z62820 Parent-biological child conflict: Secondary | ICD-10-CM | POA: Diagnosis not present

## 2022-09-13 DIAGNOSIS — Z818 Family history of other mental and behavioral disorders: Secondary | ICD-10-CM | POA: Diagnosis not present

## 2022-09-13 DIAGNOSIS — Z01818 Encounter for other preprocedural examination: Secondary | ICD-10-CM | POA: Diagnosis not present

## 2022-09-13 DIAGNOSIS — Z1152 Encounter for screening for COVID-19: Secondary | ICD-10-CM | POA: Diagnosis not present

## 2022-09-13 DIAGNOSIS — F250 Schizoaffective disorder, bipolar type: Secondary | ICD-10-CM | POA: Diagnosis not present

## 2022-09-13 DIAGNOSIS — D649 Anemia, unspecified: Secondary | ICD-10-CM | POA: Diagnosis not present

## 2022-09-13 DIAGNOSIS — Z91148 Patient's other noncompliance with medication regimen for other reason: Secondary | ICD-10-CM | POA: Diagnosis not present

## 2022-09-13 DIAGNOSIS — E876 Hypokalemia: Secondary | ICD-10-CM | POA: Diagnosis not present

## 2022-09-13 DIAGNOSIS — F29 Unspecified psychosis not due to a substance or known physiological condition: Secondary | ICD-10-CM | POA: Diagnosis not present

## 2022-09-13 LAB — SARS CORONAVIRUS 2 BY RT PCR: SARS Coronavirus 2 by RT PCR: NEGATIVE

## 2022-09-13 NOTE — ED Notes (Signed)
Called report to Jeni Salles RN at [facility].

## 2022-09-13 NOTE — ED Notes (Signed)
Pt crying  and complaining and asking to used the phone.

## 2022-09-13 NOTE — ED Provider Notes (Signed)
Emergency Medicine Observation Re-evaluation Note  Ashley Cole is a 37 y.o. female, seen on rounds today.  Pt initially presented to the ED for complaints of No chief complaint on file. Currently, the patient is awake and alert.  She has been accepted to [facility] this am.  Physical Exam  BP 128/85 (BP Location: Right Arm)   Pulse (!) 106   Temp 98.7 F (37.1 C) (Oral)   Resp 20   Ht 5\' 1"  (1.549 m)   Wt 67.1 kg   LMP 09/05/2022 (Approximate)   SpO2 100%   BMI 27.96 kg/m  Physical Exam General: awake and alert Cardiac: rr Lungs: clear Psych: calm  ED Course / MDM  EKG:EKG Interpretation  Date/Time:  Wednesday September 12 2022 16:34:21 EDT Ventricular Rate:  90 PR Interval:  123 QRS Duration: 94 QT Interval:  375 QTC Calculation: 459 R Axis:   78 Text Interpretation: Sinus rhythm Normal ECG When compared with ECG of EARLIER SAME DATE No significant change was found Confirmed by Dione Booze ([id number]) on 09/13/2022 6:41:06 AM  I have reviewed the labs performed to date as well as medications administered while in observation.  Recent changes in the last 24 hours include awaiting transfer to [facility].  Plan  Current plan is for tx to [facility].    Jacalyn Lefevre, MD 09/13/22 [phone]

## 2022-09-13 NOTE — ED Notes (Signed)
Called Sheriff and left a voicemail for them to call me back about transporting patient to [facility].

## 2022-09-13 NOTE — ED Notes (Signed)
Patient black shoe is in the room with her RN notify of this

## 2022-09-13 NOTE — ED Notes (Signed)
Sheriff call back and they are sending someone to transport patient

## 2022-09-14 DIAGNOSIS — K219 Gastro-esophageal reflux disease without esophagitis: Secondary | ICD-10-CM | POA: Diagnosis not present

## 2022-09-14 DIAGNOSIS — D649 Anemia, unspecified: Secondary | ICD-10-CM | POA: Diagnosis not present

## 2022-09-14 DIAGNOSIS — Z5901 Sheltered homelessness: Secondary | ICD-10-CM | POA: Diagnosis not present

## 2022-09-14 DIAGNOSIS — F29 Unspecified psychosis not due to a substance or known physiological condition: Secondary | ICD-10-CM | POA: Diagnosis not present

## 2022-09-14 DIAGNOSIS — F1010 Alcohol abuse, uncomplicated: Secondary | ICD-10-CM | POA: Diagnosis not present

## 2022-09-14 DIAGNOSIS — Z818 Family history of other mental and behavioral disorders: Secondary | ICD-10-CM | POA: Diagnosis not present

## 2022-09-14 DIAGNOSIS — Z62810 Personal history of physical and sexual abuse in childhood: Secondary | ICD-10-CM | POA: Diagnosis not present

## 2022-09-14 DIAGNOSIS — Z62820 Parent-biological child conflict: Secondary | ICD-10-CM | POA: Diagnosis not present

## 2022-09-14 DIAGNOSIS — Z91148 Patient's other noncompliance with medication regimen for other reason: Secondary | ICD-10-CM | POA: Diagnosis not present

## 2022-09-14 DIAGNOSIS — F17210 Nicotine dependence, cigarettes, uncomplicated: Secondary | ICD-10-CM | POA: Diagnosis not present

## 2022-09-14 DIAGNOSIS — A630 Anogenital (venereal) warts: Secondary | ICD-10-CM | POA: Diagnosis not present

## 2022-09-14 DIAGNOSIS — F250 Schizoaffective disorder, bipolar type: Secondary | ICD-10-CM | POA: Diagnosis not present

## 2022-09-17 DIAGNOSIS — F250 Schizoaffective disorder, bipolar type: Secondary | ICD-10-CM | POA: Diagnosis not present

## 2022-09-19 DIAGNOSIS — F250 Schizoaffective disorder, bipolar type: Secondary | ICD-10-CM | POA: Diagnosis not present

## 2022-10-24 ENCOUNTER — Ambulatory Visit: Payer: 59 | Admitting: Physician Assistant

## 2022-10-24 ENCOUNTER — Other Ambulatory Visit (HOSPITAL_COMMUNITY)
Admission: RE | Admit: 2022-10-24 | Discharge: 2022-10-24 | Disposition: A | Discharge status: Home / Self Care | Payer: 59 | Source: Ambulatory Visit | Attending: Physician Assistant | Admitting: Physician Assistant

## 2022-10-24 ENCOUNTER — Encounter: Payer: Self-pay | Admitting: Physician Assistant

## 2022-10-24 VITALS — BP 111/73 | HR 93 | Ht 60.0 in | Wt 135.0 lb

## 2022-10-24 DIAGNOSIS — Z113 Encounter for screening for infections with a predominantly sexual mode of transmission: Secondary | ICD-10-CM | POA: Diagnosis not present

## 2022-10-24 DIAGNOSIS — F17210 Nicotine dependence, cigarettes, uncomplicated: Secondary | ICD-10-CM

## 2022-10-24 DIAGNOSIS — B3731 Acute candidiasis of vulva and vagina: Secondary | ICD-10-CM

## 2022-10-24 DIAGNOSIS — B9689 Other specified bacterial agents as the cause of diseases classified elsewhere: Secondary | ICD-10-CM

## 2022-10-24 DIAGNOSIS — N760 Acute vaginitis: Secondary | ICD-10-CM | POA: Diagnosis not present

## 2022-10-24 MED ORDER — FLUCONAZOLE 150 MG PO TABS
150.0000 mg | ORAL_TABLET | Freq: Once | ORAL | 0 refills | Status: AC
Start: 2022-10-24 — End: 2022-10-24

## 2022-10-24 NOTE — Progress Notes (Signed)
New Patient Office Visit  Subjective    Patient ID: Ashley Cole, female    DOB: [birth year]  Age: 37 y.o. MRN: [id number]  CC:  Chief Complaint  Patient presents with   Vaginal Itching    X4 days. tried OTC treatment, little relief     HPI Ashley Cole states that she has been experiencing vaginal itching and  white clumpy discharge   for the past 4 days,  States that she tried OTC yeast infection suppository with a little relief.    Denies known exposure to sexually transmitted diseases however does request screening at this time. Outpatient Encounter Medications as of 10/24/2022  Medication Sig   fluconazole (DIFLUCAN) 150 MG tablet Take 1 tablet (150 mg total) by mouth once for 1 dose.   IRON PO iron   Multiple Vitamins-Minerals (WOMENS MULTIVITAMIN PO) Take 1 tablet by mouth daily.   acetaminophen (TYLENOL) 500 MG tablet Take 1 tablet (500 mg total) by mouth every 6 (six) hours as needed. (Patient not taking: Reported on 10/24/2022)   albuterol (PROVENTIL HFA;VENTOLIN HFA) 108 (90 Base) MCG/ACT inhaler Inhale 2 puffs into the lungs every 6 (six) hours as needed for wheezing or shortness of breath. (Patient not taking: Reported on 10/24/2022)   lidocaine (XYLOCAINE) 2 % solution Use as directed 15 mLs in the mouth or throat as needed for mouth pain. (Patient not taking: Reported on 04/11/2022)   loperamide (IMODIUM) 2 MG capsule Take 1 capsule (2 mg total) by mouth 4 (four) times daily as needed for diarrhea or loose stools. (Patient not taking: Reported on 04/11/2022)   pantoprazole (PROTONIX) 20 MG tablet Take 1 tablet (20 mg total) by mouth daily. (Patient not taking: Reported on 10/24/2022)   [DISCONTINUED] FLUoxetine (PROZAC) 20 MG capsule  (Patient not taking: Reported on 12/17/2019)   [DISCONTINUED] fluticasone (FLONASE) 50 MCG/ACT nasal spray Place into both nostrils daily. (Patient not taking: Reported on 12/17/2019)   [DISCONTINUED] montelukast (SINGULAIR) 10 MG tablet Take  10 mg by mouth at bedtime. (Patient not taking: Reported on 12/17/2019)   No facility-administered encounter medications on file as of 10/24/2022.    Past Medical History:  Diagnosis Date   Abnormal Pap smear    Anemia    Anxiety    Chlamydia    COVID-19    GERD (gastroesophageal reflux disease)    Headache(784.0)    Human papilloma virus    cells removed x2   Spinal headache    Trichomonas    Urinary tract infection    Vaginal Pap smear, abnormal     Past Surgical History:  Procedure Laterality Date   DILATION AND CURETTAGE OF UTERUS     GYNECOLOGIC CRYOSURGERY     PILONIDAL CYST EXCISION     THERAPEUTIC ABORTION      Family History  Problem Relation Age of Onset   Hypertension Mother    Fibroids Mother    Cancer Mother        ovarian   Cancer Maternal Grandfather        cervical   Thyroid disease Brother     Social History   Socioeconomic History   Marital status: Legally Separated    Spouse name: Not on file   Number of children: Not on file   Years of education: Not on file   Highest education level: Not on file  Occupational History   Not on file  Tobacco Use   Smoking status: Every Day    Years: 47  Types: Cigarettes   Smokeless tobacco: Never  Vaping Use   Vaping Use: Never used  Substance and Sexual Activity   Alcohol use: Yes    Comment:  socially   Drug use: No   Sexual activity: Yes    Birth control/protection: None    Comment: husband is sterile  Other Topics Concern   Not on file  Social History Narrative   Not on file   Social Determinants of Health   Financial Resource Strain: Not on file  Food Insecurity: Not on file  Transportation Needs: Not on file  Physical Activity: Not on file  Stress: Not on file  Social Connections: Not on file  Intimate Partner Violence: Not on file    Review of Systems  Constitutional:  Negative for chills and fever.  HENT: Negative.    Eyes: Negative.   Respiratory:  Negative for shortness  of breath.   Cardiovascular:  Negative for chest pain.  Gastrointestinal:  Negative for nausea and vomiting.  Genitourinary:  Negative for dysuria, frequency and urgency.  Musculoskeletal:  Negative for back pain.  Skin: Negative.   Endo/Heme/Allergies: Negative.   Psychiatric/Behavioral: Negative.          Objective    BP 111/73 (BP Location: Left Arm, Patient Position: Sitting, Cuff Size: Large)   Pulse 93   Ht 5' (1.524 m)   Wt 135 lb (61.2 kg)   LMP 09/27/2022   SpO2 98%   BMI 26.37 kg/m   Physical Exam Vitals and nursing note reviewed.  Constitutional:      Appearance: Normal appearance.  HENT:     Head: Normocephalic and atraumatic.     Right Ear: External ear normal.     Left Ear: External ear normal.     Nose: Nose normal.     Mouth/Throat:     Mouth: Mucous membranes are moist.     Pharynx: Oropharynx is clear.  Eyes:     Extraocular Movements: Extraocular movements intact.     Conjunctiva/sclera: Conjunctivae normal.     Pupils: Pupils are equal, round, and reactive to light.  Cardiovascular:     Rate and Rhythm: Normal rate.     Pulses: Normal pulses.     Heart sounds: Normal heart sounds.  Pulmonary:     Effort: Pulmonary effort is normal.     Breath sounds: Normal breath sounds.  Musculoskeletal:        General: Normal range of motion.     Cervical back: Normal range of motion and neck supple.  Skin:    General: Skin is warm and dry.  Neurological:     General: No focal deficit present.     Mental Status: She is alert and oriented to person, place, and time.  Psychiatric:        Mood and Affect: Mood normal.        Behavior: Behavior normal.        Thought Content: Thought content normal.        Judgment: Judgment normal.         Assessment & Plan:   Problem List Items Addressed This Visit   None Visit Diagnoses     Vaginal yeast infection    -  Primary   Relevant Medications   fluconazole (DIFLUCAN) 150 MG tablet   Screen for  STD (sexually transmitted disease)       Relevant Orders   HIV antibody (with reflex)   RPR   Cervicovaginal ancillary only  1. Vaginal yeast infection Trial Diflucan.  Patient education given on supportive care.  Red flags given for prompt reevaluation - fluconazole (DIFLUCAN) 150 MG tablet; Take 1 tablet (150 mg total) by mouth once for 1 dose.  Dispense: 1 tablet; Refill: 0  2. Screen for STD (sexually transmitted disease)  - HIV antibody (with reflex) - RPR - Cervicovaginal ancillary only   I have reviewed the patient's medical history (PMH, PSH, Social History, Family History, Medications, and allergies) , and have been updated if relevant. I spent 21 minutes reviewing chart and  face to face time with patient.    Return if symptoms worsen or fail to improve.   Kasandra Knudsen Mayers, PA-C

## 2022-10-24 NOTE — Patient Instructions (Signed)
You are going to take Diflucan once.  We will call you with today's lab results.  Roney Jaffe, PA-C Physician Assistant [facility] [email or web address]   Vaginal Yeast Infection, Adult  Vaginal yeast infection is a condition that causes vaginal discharge as well as soreness, swelling, and redness (inflammation) of the vagina. This is a common condition. Some women get this infection frequently. What are the causes? This condition is caused by a change in the normal balance of the yeast (Candida) and normal bacteria that live in the vagina. This change causes an overgrowth of yeast, which causes the inflammation. What increases the risk? The condition is more likely to develop in women who: Take antibiotic medicines. Have diabetes. Take birth control pills. Are pregnant. Douche often. Have a weak body defense system (immune system). Have been taking steroid medicines for a long time. Frequently wear tight clothing. What are the signs or symptoms? Symptoms of this condition include: White, thick, creamy vaginal discharge. Swelling, itching, redness, and irritation of the vagina. The lips of the vagina (labia) may be affected as well. Pain or a burning feeling while urinating. Pain during sex. How is this diagnosed? This condition is diagnosed based on: Your medical history. A physical exam. A pelvic exam. Your health care provider will examine a sample of your vaginal discharge under a microscope. Your health care provider may send this sample for testing to confirm the diagnosis. How is this treated? This condition is treated with medicine. Medicines may be over-the-counter or prescription. You may be told to use one or more of the following: Medicine that is taken by mouth (orally). Medicine that is applied as a cream (topically). Medicine that is inserted directly into the vagina (suppository). Follow these  instructions at home: Take or apply over-the-counter and prescription medicines only as told by your health care provider. Do not use tampons until your health care provider approves. Do not have sex until your infection has cleared. Sex can prolong or worsen your symptoms of infection. Ask your health care provider when it is safe to resume sexual activity. Keep all follow-up visits. This is important. How is this prevented?  Do not wear tight clothes, such as pantyhose or tight pants. Wear breathable cotton underwear. Do not use douches, perfumed soap, creams, or powders. Wipe from front to back after using the toilet. If you have diabetes, keep your blood sugar levels under control. Ask your health care provider for other ways to prevent yeast infections. Contact a health care provider if: You have a fever. Your symptoms go away and then return. Your symptoms do not get better with treatment. Your symptoms get worse. You have new symptoms. You develop blisters in or around your vagina. You have blood coming from your vagina and it is not your menstrual period. You develop pain in your abdomen. Summary Vaginal yeast infection is a condition that causes discharge as well as soreness, swelling, and redness (inflammation) of the vagina. This condition is treated with medicine. Medicines may be over-the-counter or prescription. Take or apply over-the-counter and prescription medicines only as told by your health care provider. Do not douche. Resume sexual activity or use of tampons as instructed by your health care provider. Contact a health care provider if your symptoms do not get better with treatment or your symptoms go away and then return. This information is not intended to replace advice given to you by your health care provider. Make sure you discuss any questions you have  with your health care provider. Document Revised: 08/08/2020 Document Reviewed: 08/08/2020 Elsevier Patient  Education  2023 [employer].

## 2022-10-25 LAB — CERVICOVAGINAL ANCILLARY ONLY
Bacterial Vaginitis (gardnerella): POSITIVE — AB
Candida Glabrata: NEGATIVE
Candida Vaginitis: POSITIVE — AB
Chlamydia: NEGATIVE
Comment: NEGATIVE
Comment: NEGATIVE
Comment: NEGATIVE
Comment: NEGATIVE
Comment: NEGATIVE
Comment: NORMAL
Neisseria Gonorrhea: NEGATIVE
Trichomonas: NEGATIVE

## 2022-10-25 LAB — HIV ANTIBODY (ROUTINE TESTING W REFLEX): HIV Screen 4th Generation wRfx: NONREACTIVE

## 2022-10-25 LAB — RPR: RPR Ser Ql: NONREACTIVE

## 2022-10-26 ENCOUNTER — Telehealth: Payer: Self-pay | Admitting: Physician Assistant

## 2022-10-26 MED ORDER — METRONIDAZOLE 500 MG PO TABS
500.0000 mg | ORAL_TABLET | Freq: Two times a day (BID) | ORAL | 0 refills | Status: DC
Start: 2022-10-26 — End: 2022-10-30

## 2022-10-26 NOTE — Addendum Note (Signed)
Addended by: Roney Jaffe on: 10/26/2022 02:04 PM   Modules accepted: Orders

## 2022-10-26 NOTE — Telephone Encounter (Signed)
Routing to CMA 

## 2022-10-26 NOTE — Telephone Encounter (Signed)
Pt was seen at [facility] on May 22. She stated she has seen her lab results and is asking if treatment will be sent in for her. Pt mentioned will need medication for a yeast infection after antibiotics.  Please advise.  [id number] [place], [place] - [address]  [address] [place] [address]  Phone: [phone] Fax: [phone]  Hours: Not open 24 hours

## 2022-10-30 ENCOUNTER — Other Ambulatory Visit: Payer: Self-pay | Admitting: Physician Assistant

## 2022-10-30 DIAGNOSIS — B9689 Other specified bacterial agents as the cause of diseases classified elsewhere: Secondary | ICD-10-CM

## 2022-10-30 MED ORDER — METRONIDAZOLE 0.75 % VA GEL: VAGINAL | 0 refills | Status: DC

## 2022-10-30 NOTE — Progress Notes (Signed)
Unable to tolerate oral metronidazole

## 2023-01-14 ENCOUNTER — Encounter: Payer: Self-pay | Admitting: Physician Assistant

## 2023-01-14 ENCOUNTER — Ambulatory Visit: Payer: 59 | Admitting: Physician Assistant

## 2023-01-14 ENCOUNTER — Other Ambulatory Visit (HOSPITAL_COMMUNITY)
Admission: RE | Admit: 2023-01-14 | Discharge: 2023-01-14 | Disposition: A | Discharge status: Home / Self Care | Payer: 59 | Source: Ambulatory Visit | Attending: Physician Assistant | Admitting: Physician Assistant

## 2023-01-14 VITALS — BP 150/95 | HR 133 | Ht 61.0 in | Wt 133.0 lb

## 2023-01-14 DIAGNOSIS — R000 Tachycardia, unspecified: Secondary | ICD-10-CM | POA: Diagnosis not present

## 2023-01-14 DIAGNOSIS — Z113 Encounter for screening for infections with a predominantly sexual mode of transmission: Secondary | ICD-10-CM | POA: Diagnosis not present

## 2023-01-14 DIAGNOSIS — R030 Elevated blood-pressure reading, without diagnosis of hypertension: Secondary | ICD-10-CM | POA: Diagnosis not present

## 2023-01-14 DIAGNOSIS — R350 Frequency of micturition: Secondary | ICD-10-CM

## 2023-01-14 LAB — POCT URINALYSIS DIP (CLINITEK)
Bilirubin, UA: NEGATIVE
Glucose, UA: NEGATIVE mg/dL
Ketones, POC UA: NEGATIVE mg/dL
Leukocytes, UA: NEGATIVE
Nitrite, UA: NEGATIVE
POC PROTEIN,UA: NEGATIVE
Spec Grav, UA: 1.02 (ref 1.010–1.025)
Urobilinogen, UA: 0.2 E.U./dL
pH, UA: 7 (ref 5.0–8.0)

## 2023-01-14 NOTE — Progress Notes (Signed)
Established Patient Office Visit  Subjective   Patient ID: Ashley Cole, female    DOB: 11/10/1985  Age: 37 y.o. MRN: [id number]  Chief Complaint  Patient presents with   Urinary Frequency   Vaginal Discharge   std Screening     States that she has been experiencing urinary frequency and increased urgency for the past 2 weeks.  Denies dysuria, suprapubic or back pain.  Does endorse that she has increased her water intake.  States that she does not check her blood pressure or pulse at home, states that she did smoke a cigarette prior to today's office visit, states that her stress levels have been elevated.  Request STI screening, denies any known exposure.    Past Medical History:  Diagnosis Date   Abnormal Pap smear    Anemia    Anxiety    Chlamydia    COVID-19    GERD (gastroesophageal reflux disease)    Headache(784.0)    Human papilloma virus    cells removed x2   Spinal headache    Trichomonas    Urinary tract infection    Vaginal Pap smear, abnormal    Social History   Socioeconomic History   Marital status: Legally Separated    Spouse name: Not on file   Number of children: Not on file   Years of education: Not on file   Highest education level: Not on file  Occupational History   Not on file  Tobacco Use   Smoking status: Every Day    Types: Cigarettes   Smokeless tobacco: Never  Vaping Use   Vaping status: Never Used  Substance and Sexual Activity   Alcohol use: Yes    Comment:  socially   Drug use: No   Sexual activity: Yes    Birth control/protection: None    Comment: husband is sterile  Other Topics Concern   Not on file  Social History Narrative   Not on file   Social Determinants of Health   Financial Resource Strain: Not on file  Food Insecurity: Not on file  Transportation Needs: Not on file  Physical Activity: Not on file  Stress: Not on file  Social Connections: Not on file  Intimate Partner Violence: Not on file    Family History  Problem Relation Age of Onset   Hypertension Mother    Fibroids Mother    Cancer Mother        ovarian   Cancer Maternal Grandfather        cervical   Thyroid disease Brother    No Known Allergies  Review of Systems  Constitutional:  Negative for chills and fever.  HENT: Negative.    Eyes: Negative.   Respiratory:  Negative for shortness of breath.   Cardiovascular:  Negative for chest pain and palpitations.  Gastrointestinal:  Negative for nausea and vomiting.  Genitourinary:  Positive for frequency and urgency.  Musculoskeletal:  Negative for back pain.  Skin: Negative.   Neurological: Negative.   Endo/Heme/Allergies: Negative.       Objective:     BP (!) 150/95 (BP Location: Left Arm, Patient Position: Sitting, Cuff Size: Large)   Pulse (!) 133   Ht 5\' 1"  (1.549 m)   Wt 133 lb (60.3 kg)   LMP 12/24/2022   BMI 25.13 kg/m  BP Readings from Last 3 Encounters:  01/14/23 (!) 150/95  10/24/22 111/73  09/13/22 137/83   Wt Readings from Last 3 Encounters:  01/14/23 133 lb (60.3  kg)  10/24/22 135 lb (61.2 kg)  09/12/22 148 lb (67.1 kg)      Physical Exam Vitals and nursing note reviewed.  Constitutional:      Appearance: Normal appearance.  HENT:     Head: Normocephalic and atraumatic.     Right Ear: External ear normal.     Left Ear: External ear normal.     Nose: Nose normal.     Mouth/Throat:     Mouth: Mucous membranes are moist.     Pharynx: Oropharynx is clear.  Eyes:     Extraocular Movements: Extraocular movements intact.     Conjunctiva/sclera: Conjunctivae normal.     Pupils: Pupils are equal, round, and reactive to light.  Cardiovascular:     Rate and Rhythm: Regular rhythm. Tachycardia present.     Pulses: Normal pulses.     Heart sounds: Normal heart sounds.  Pulmonary:     Effort: Pulmonary effort is normal.     Breath sounds: Normal breath sounds.  Abdominal:     Tenderness: There is no abdominal tenderness. There  is no right CVA tenderness or left CVA tenderness.  Musculoskeletal:        General: Normal range of motion.     Cervical back: Normal range of motion and neck supple.  Skin:    General: Skin is warm and dry.  Neurological:     General: No focal deficit present.     Mental Status: She is alert and oriented to person, place, and time.  Psychiatric:        Mood and Affect: Mood normal.        Behavior: Behavior normal.        Thought Content: Thought content normal.        Judgment: Judgment normal.        Assessment & Plan:   Problem List Items Addressed This Visit   None Visit Diagnoses     Urinary frequency    -  Primary   Relevant Orders   Urine Culture   POCT URINALYSIS DIP (CLINITEK) (Completed)   Basic Metabolic Panel   Tachycardia       Relevant Orders   TSH   Elevated blood pressure reading in office without diagnosis of hypertension       Screen for STD (sexually transmitted disease)       Relevant Orders   HIV antibody (with reflex)   RPR   Cervicovaginal ancillary only     1. Urinary frequency UA negative for urinary tract infection.  Patient education given on supportive care - Urine Culture - POCT URINALYSIS DIP (CLINITEK) - Basic Metabolic Panel  2. Tachycardia Patient education given on supportive care, red flags for prompt reevaluation - TSH  3. Elevated blood pressure reading in office without diagnosis of hypertension Patient encouraged to check blood pressure and pulse at home, keep a written log and have available for all office visits.  Patient encouraged to follow-up with primary care provider or return to the mobile unit if pulse or blood pressure readings remain elevated.  4. Screen for STD (sexually transmitted disease)  - HIV antibody (with reflex) - RPR - Cervicovaginal ancillary only   I have reviewed the patient's medical history (PMH, PSH, Social History, Family History, Medications, and allergies) , and have been updated if  relevant. I spent 30 minutes reviewing chart and  face to face time with patient.     Return if symptoms worsen or fail to improve.  Kasandra Knudsen Mayers, PA-C

## 2023-01-14 NOTE — Patient Instructions (Addendum)
We will call you with today's lab results and treat as needed.  Your blood pressure and your pulse rate are elevated today.  I do encourage you to check your blood pressure and pulse at home once daily over the next 2 weeks, keep a written log and have available for all office visits.  If your blood pressure and or pulse continues to be elevated, I do encourage you to return to the mobile unit or follow-up with your primary care provider for further evaluation.  Roney Jaffe, PA-C Physician Assistant [facility] [email or web address]   How to Take Your Blood Pressure Blood pressure is a measurement of how strongly your blood is pressing against the walls of your arteries. Arteries are blood vessels that carry blood from your heart throughout your body. Your health care provider takes your blood pressure at each office visit. You can also take your own blood pressure at home with a blood pressure monitor. You may need to take your own blood pressure to: Confirm a diagnosis of high blood pressure (hypertension). Monitor your blood pressure over time. Make sure your blood pressure medicine is working. Supplies needed: Blood pressure monitor. A chair to sit in. This should be a chair where you can sit upright with your back supported. Do not sit on a soft couch or an armchair. Table or desk. Small notebook and pencil or pen. How to prepare To get the most accurate reading, avoid the following for 30 minutes before you check your blood pressure: Drinking caffeine. Drinking alcohol. Eating. Smoking. Exercising. Five minutes before you check your blood pressure: Use the bathroom and urinate so that you have an empty bladder. Sit quietly in a chair. Do not talk. How to take your blood pressure To check your blood pressure, follow the instructions in the manual that came with your blood pressure monitor. If you have a digital blood  pressure monitor, the instructions may be as follows: Sit up straight in a chair. Place your feet on the floor. Do not cross your ankles or legs. Rest your left arm at the level of your heart on a table or desk or on the arm of a chair. Pull up your shirt sleeve. Wrap the blood pressure cuff around the upper part of your left arm, 1 inch (2.5 cm) above your elbow. It is best to wrap the cuff around bare skin. Fit the cuff snugly, but not too tightly, around your arm. You should be able to place only one finger between the cuff and your arm. Position the cord so that it rests in the bend of your elbow. Press the power button. Sit quietly while the cuff inflates and deflates. Read the digital reading on the monitor screen and write the numbers down (record them) in a notebook. Wait 2-3 minutes, then repeat the steps, starting at step 1. What does my blood pressure reading mean? A blood pressure reading consists of a higher number over a lower number. Ideally, your blood pressure should be below 120/80. The first ("top") number is called the systolic pressure. It is a measure of the pressure in your arteries as your heart beats. The second ("bottom") number is called the diastolic pressure. It is a measure of the pressure in your arteries as the heart relaxes. Blood pressure is classified into four stages. The following are the stages for adults who do not have a short-term serious illness or a chronic condition. Systolic pressure and diastolic pressure are measured in a  unit called mm Hg (millimeters of mercury).  Normal Systolic pressure: below 120. Diastolic pressure: below 80. Elevated Systolic pressure: 120-129. Diastolic pressure: below 80. Hypertension stage 1 Systolic pressure: 130-139. Diastolic pressure: 80-89. Hypertension stage 2 Systolic pressure: 140 or above. Diastolic pressure: 90 or above. You can have elevated blood pressure or hypertension even if only the systolic or only  the diastolic number in your reading is higher than normal. Follow these instructions at home: Medicines Take over-the-counter and prescription medicines only as told by your health care provider. Tell your health care provider if you are having any side effects from blood pressure medicine. General instructions Check your blood pressure as often as recommended by your health care provider. Check your blood pressure at the same time every day. Take your monitor to the next appointment with your health care provider to make sure that: You are using it correctly. It provides accurate readings. Understand what your goal blood pressure numbers are. Keep all follow-up visits. This is important. General tips Your health care provider can suggest a reliable monitor that will meet your needs. There are several types of home blood pressure monitors. Choose a monitor that has an arm cuff. Do not choose a monitor that measures your blood pressure from your wrist or finger. Choose a cuff that wraps snugly, not too tight or too loose, around your upper arm. You should be able to fit only one finger between your arm and the cuff. You can buy a blood pressure monitor at most drugstores or online. Where to find more information American Heart Association: www.heart.org Contact a health care provider if: Your blood pressure is consistently high. Your blood pressure is suddenly low. Get help right away if: Your systolic blood pressure is higher than 180. Your diastolic blood pressure is higher than 120. These symptoms may be an emergency. Get help right away. Call 911. Do not wait to see if the symptoms will go away. Do not drive yourself to the hospital. Summary Blood pressure is a measurement of how strongly your blood is pressing against the walls of your arteries. A blood pressure reading consists of a higher number over a lower number. Ideally, your blood pressure should be below 120/80. Check  your blood pressure at the same time every day. Avoid caffeine, alcohol, smoking, and exercise for 30 minutes prior to checking your blood pressure. These agents can affect the accuracy of the blood pressure reading. This information is not intended to replace advice given to you by your health care provider. Make sure you discuss any questions you have with your health care provider. Document Revised: 02/02/2021 Document Reviewed: 02/02/2021 Elsevier Patient Education  2024 [employer]vier Inc.  Sinus Tachycardia  Sinus tachycardia is a fast heartbeat. In sinus tachycardia, the heart beats more than 100 times a minute. Sinus tachycardia starts in the part of the heart called the sinoatrial (SA) node. Sinus tachycardia may be harmless, or it may be a sign of a serious condition. What are the causes? This condition may be caused by: Exercise or exertion. A fever. Pain. Loss of body fluids (dehydration). Severe bleeding (hemorrhage). Anxiety and stress. Certain substances, including: Alcohol. Caffeine. Tobacco and nicotine products. Cold medicines. Illegal drugs. Medical conditions including: Heart disease. An infection. An overactive thyroid (hyperthyroidism). A lack of red blood cells (anemia). What are the signs or symptoms? Symptoms of this condition include: A feeling that the heart is beating fast or unevenly (palpitations). Suddenly noticing your heartbeat (cardiac awareness). Lightheadedness. Tiredness (  fatigue). Shortness of breath. Chest pain. Nausea. Fainting. How is this diagnosed? This condition is diagnosed with: A physical exam. Tests or monitoring, such as: Blood tests. An electrocardiogram (ECG). This test measures the electrical activity of the heart. Ambulatory cardiac monitor. This records your heartbeats for 24 hours or more. You may be referred to a heart specialist (cardiologist). How is this treated? Treatment for this condition depends on the cause.  Treatment may involve: Treating the underlying condition. Taking new medicines or changing your current medicines as told by your health care provider. Making changes to your diet or lifestyle. Follow these instructions at home: Lifestyle  Do not use any products that contain nicotine or tobacco. These products include cigarettes, chewing tobacco, and vaping devices, such as e-cigarettes. If you need help quitting, ask your health care provider. Do not use illegal drugs, such as cocaine. Learn relaxation methods to help you when you get stressed or anxious. These include deep breathing. Avoid caffeine or other stimulants, including herbal stimulants that are found in energy drinks. Alcohol use  Do not drink alcohol if: Your health care provider tells you not to drink. You are pregnant, may be pregnant, or are planning to become pregnant. If you drink alcohol: Limit how much you have to: 0-1 drink a day for women. 0-2 drinks a day for men. Know how much alcohol is in your drink. In the [place] one drink equals one 12 oz bottle of beer (355 mL), one 5 oz glass of wine (148 mL), or one 1 oz glass of hard liquor (44 mL). General instructions Drink enough fluids to keep your urine pale yellow. Take over-the-counter and prescription medicines only as told by your health care provider. Ask your health care provider about taking vitamins, herbs, and supplements. Contact a health care provider if: You have vomiting or diarrhea that does not go away. You have a fever. You have weakness or dizziness. You feel faint. Get help right away if: You have pain in your chest, upper arms, jaw, or neck. You have palpitations that do not go away. Summary In sinus tachycardia, the heart beats more than 100 times a minute. Sinus tachycardia may be harmless, or it may be a sign of a serious condition. Treatment for this condition depends on the cause or the underlying condition. Get help right away if you  have pain in your chest, upper arms, jaw, or neck. This information is not intended to replace advice given to you by your health care provider. Make sure you discuss any questions you have with your health care provider. Document Revised: 09/19/2021 Document Reviewed: 09/19/2021 Elsevier Patient Education  2024 [employer].

## 2023-01-17 ENCOUNTER — Encounter (HOSPITAL_COMMUNITY): Payer: Self-pay

## 2023-01-17 ENCOUNTER — Emergency Department (HOSPITAL_COMMUNITY): Payer: 59

## 2023-01-17 ENCOUNTER — Emergency Department (HOSPITAL_COMMUNITY)
Admission: EM | Admit: 2023-01-17 | Discharge: 2023-01-17 | Disposition: A | Discharge status: Home / Self Care | Payer: 59 | Attending: Emergency Medicine | Admitting: Emergency Medicine

## 2023-01-17 ENCOUNTER — Other Ambulatory Visit: Payer: Self-pay

## 2023-01-17 DIAGNOSIS — R140 Abdominal distension (gaseous): Secondary | ICD-10-CM | POA: Diagnosis not present

## 2023-01-17 DIAGNOSIS — Z79899 Other long term (current) drug therapy: Secondary | ICD-10-CM | POA: Diagnosis not present

## 2023-01-17 DIAGNOSIS — R1011 Right upper quadrant pain: Secondary | ICD-10-CM | POA: Diagnosis not present

## 2023-01-17 DIAGNOSIS — I10 Essential (primary) hypertension: Secondary | ICD-10-CM | POA: Diagnosis not present

## 2023-01-17 DIAGNOSIS — R000 Tachycardia, unspecified: Secondary | ICD-10-CM | POA: Insufficient documentation

## 2023-01-17 LAB — CBC
HCT: 37.7 % (ref 36.0–46.0)
Hemoglobin: 12.7 g/dL (ref 12.0–15.0)
MCH: 30.5 pg (ref 26.0–34.0)
MCHC: 33.7 g/dL (ref 30.0–36.0)
MCV: 90.6 fL (ref 80.0–100.0)
Platelets: 357 10*3/uL (ref 150–400)
RBC: 4.16 MIL/uL (ref 3.87–5.11)
RDW: 13.2 % (ref 11.5–15.5)
WBC: 7.4 10*3/uL (ref 4.0–10.5)
nRBC: 0 % (ref 0.0–0.2)

## 2023-01-17 LAB — BASIC METABOLIC PANEL
Anion gap: 13 (ref 5–15)
BUN: <5 mg/dL — ABNORMAL LOW (ref 6–20)
CO2: 23 mmol/L (ref 22–32)
Calcium: 9.7 mg/dL (ref 8.9–10.3)
Chloride: 102 mmol/L (ref 98–111)
Creatinine, Ser: 0.77 mg/dL (ref 0.44–1.00)
GFR, Estimated: >60 mL/min (ref >60)
Glucose, Bld: 134 mg/dL — ABNORMAL HIGH (ref 70–99)
Potassium: 3.5 mmol/L (ref 3.5–5.1)
Sodium: 138 mmol/L (ref 135–145)

## 2023-01-17 LAB — TROPONIN I (HIGH SENSITIVITY)
Troponin I (High Sensitivity): 2 ng/L (ref <18)
Troponin I (High Sensitivity): 3 ng/L (ref <18)

## 2023-01-17 LAB — HCG, SERUM, QUALITATIVE: Preg, Serum: NEGATIVE

## 2023-01-17 MED ORDER — FENTANYL CITRATE PF 50 MCG/ML IJ SOSY: 50.0000 ug | PREFILLED_SYRINGE | Freq: Once | INTRAMUSCULAR | Status: DC

## 2023-01-17 MED ORDER — SODIUM CHLORIDE 0.9 % IV BOLUS: 1000.0000 mL | Freq: Once | INTRAVENOUS | Status: DC

## 2023-01-17 MED ORDER — ONDANSETRON HCL 4 MG/2ML IJ SOLN: 4.0000 mg | Freq: Once | INTRAMUSCULAR | Status: DC

## 2023-01-17 NOTE — ED Provider Notes (Signed)
[place] EMERGENCY DEPARTMENT AT [facility] Provider Note   CSN: [id number] Arrival date & time: 01/17/23  1223     History  Chief Complaint  Patient presents with   Abdominal Pain    Ashley Cole is a 37 y.o. female.  The history is provided by the patient and a parent.  Abdominal Pain Pain location:  RUQ Pain quality: aching, bloating and sharp   Pain radiates to:  R flank, L flank and back Pain severity:  Severe Onset quality:  Sudden Timing:  Intermittent Progression:  Waxing and waning Chronicity:  New (x 2 weeks) Context: not alcohol use, not diet changes, not eating, not laxative use, not recent illness, not recent travel, not retching, not sick contacts and not suspicious food intake   Relieved by:  Nothing Worsened by:  Deep breathing Associated symptoms: anorexia   Associated symptoms: no nausea and no vomiting   Risk factors: has not had multiple surgeries        Home Medications Prior to Admission medications   Medication Sig Start Date End Date Taking? Authorizing Provider  acetaminophen (TYLENOL) 500 MG tablet Take 1 tablet (500 mg total) by mouth every 6 (six) hours as needed. Patient not taking: Reported on 10/24/2022 08/13/19   Nche, Bonna Gains, NP  albuterol (PROVENTIL HFA;VENTOLIN HFA) 108 (90 Base) MCG/ACT inhaler Inhale 2 puffs into the lungs every 6 (six) hours as needed for wheezing or shortness of breath. Patient not taking: Reported on 10/24/2022    [provider]  escitalopram (LEXAPRO) 10 MG tablet Take 10 mg by mouth daily. Patient not taking: Reported on 01/14/2023 09/19/22   [provider]  IRON PO iron Patient not taking: Reported on 01/14/2023    [provider]  lidocaine (XYLOCAINE) 2 % solution Use as directed 15 mLs in the mouth or throat as needed for mouth pain. Patient not taking: Reported on 04/11/2022 01/30/22   Al Decant, PA-C  loperamide (IMODIUM) 2 MG capsule Take 1 capsule  (2 mg total) by mouth 4 (four) times daily as needed for diarrhea or loose stools. Patient not taking: Reported on 04/11/2022 03/21/22   Roemhildt, Lorin T, PA-C  metroNIDAZOLE (METROGEL) 0.75 % vaginal gel One applicatorful (5 g containing ~37.5 mg metronidazole) once daily at bedtime for 5 days Patient not taking: Reported on 01/14/2023 10/30/22   Mayers, Cari S, PA-C  Multiple Vitamins-Minerals (WOMENS MULTIVITAMIN PO) Take 1 tablet by mouth daily. Patient not taking: Reported on 01/14/2023    [provider]  pantoprazole (PROTONIX) 20 MG tablet Take 1 tablet (20 mg total) by mouth daily. Patient not taking: Reported on 10/24/2022 03/17/19   Anne Ng, NP  FLUoxetine (PROZAC) 20 MG capsule  09/08/18 03/06/20  [provider]  fluticasone (FLONASE) 50 MCG/ACT nasal spray Place into both nostrils daily. Patient not taking: Reported on 12/17/2019  03/06/20  [provider]  montelukast (SINGULAIR) 10 MG tablet Take 10 mg by mouth at bedtime. Patient not taking: Reported on 12/17/2019  03/06/20  [provider]      Allergies    Patient has no known allergies.    Review of Systems   Review of Systems  Gastrointestinal:  Positive for abdominal pain and anorexia. Negative for nausea and vomiting.    Physical Exam Updated Vital Signs BP (!) 151/90   Pulse (!) 110   Temp 98.9 F (37.2 C) (Oral)   Resp (!) 22   Ht 5\' 1"  (1.549 m)  Wt 61.2 kg   LMP 01/14/2023   SpO2 100%   BMI 25.51 kg/m  Physical Exam Vitals and nursing note reviewed.  Constitutional:      General: She is not in acute distress.    Appearance: She is well-developed. She is not diaphoretic.  HENT:     Head: Normocephalic and atraumatic.     Right Ear: External ear normal.     Left Ear: External ear normal.     Nose: Nose normal.     Mouth/Throat:     Mouth: Mucous membranes are moist.  Eyes:     General: No scleral icterus.    Conjunctiva/sclera: Conjunctivae normal.   Cardiovascular:     Rate and Rhythm: Normal rate and regular rhythm.     Heart sounds: Normal heart sounds. No murmur heard.    No friction rub. No gallop.  Pulmonary:     Effort: Pulmonary effort is normal. No respiratory distress.     Breath sounds: Normal breath sounds.  Abdominal:     General: Bowel sounds are normal. There is no distension.     Palpations: Abdomen is soft. There is no mass.     Tenderness: There is abdominal tenderness in the right upper quadrant. There is guarding. Positive signs include Murphy's sign.  Musculoskeletal:     Cervical back: Normal range of motion.  Skin:    General: Skin is warm and dry.  Neurological:     Mental Status: She is alert and oriented to person, place, and time.  Psychiatric:        Behavior: Behavior normal.     ED Results / Procedures / Treatments   Labs (all labs ordered are listed, but only abnormal results are displayed) Labs Reviewed  BASIC METABOLIC PANEL - Abnormal; Notable for the following components:      Result Value   Glucose, Bld 134 (*)    BUN <5 (*)    All other components within normal limits  CBC  HCG, SERUM, QUALITATIVE  TROPONIN I (HIGH SENSITIVITY)  TROPONIN I (HIGH SENSITIVITY)    EKG EKG Interpretation Date/Time:  Thursday January 17 2023 12:30:19 EDT Ventricular Rate:  129 PR Interval:  136 QRS Duration:  78 QT Interval:  306 QTC Calculation: 448 R Axis:   81  Text Interpretation: Sinus tachycardia Nonspecific ST abnormality Abnormal ECG When compared with ECG of 12-Sep-2022 16:34, increased rate and nonspecific STs now Confirmed by Meridee Score [phone]) on 01/17/2023 3:41:26 PM  Radiology No results found.  Procedures Procedures    Medications Ordered in ED Medications - No data to display  ED Course/ Medical Decision Making/ A&P Clinical Course as of 01/19/23 0958  Thu Jan 17, 2023  1657 Pulse Rate(!): 130 [AH]  1801 US ABDOMEN LIMITED RUQ (LIVER/GB) [AH]    Clinical Course  User Index [AH] Arthor Captain, PA-C                                 Medical Decision Making Patient here with RUQ pain. The emergent DDX for RUQ pain includes but is not limited to Glabladder disease, PUD, Acute Hepatitis, Pancreatitis, pyelonephritis, Pneumonia, Lower lobe PE/Infarct, Kidney stone, GERD, retrocecal appendicitis, Fitz-Hugh-Curtis syndrome, AAA, MI, Zoster.  Labs reassuring- mild hyperglycemia  I personally visualized and interpreted the images using our PACS system. Acute findings include:  RUQ [place] negative  Given tachycardia, Hypertension- I considered PE, RLL peumonia, ETOH withdrawal -ingestion (patient  denies- serotonin syndrome, thyrotoxicosis.  I discussed the need for further work up, however patient eloped pt completing evaluation and could not be reached to return.   Amount and/or Complexity of Data Reviewed Labs: ordered. Radiology: ordered. Decision-making details documented in ED Course.           Final Clinical Impression(s) / ED Diagnoses Final diagnoses:  None    Rx / DC Orders ED Discharge Orders     None         Arthor Captain, PA-C 01/19/23 1002    Terrilee Files, MD 01/19/23 1002

## 2023-01-17 NOTE — ED Notes (Signed)
Pt denies n/v/d. Pt was complaining of lower back pain (right side) and lower abdominal pain

## 2023-01-17 NOTE — ED Triage Notes (Addendum)
Pt presents with RUQ abd pain with radiation to the back x 1-2 weeks. Denies N/V/D. Pt has tried taking APAP without relief. Pt states she has had pain like this before and was told she had problems with her kidney and liver.  Pt also concerned about elevated B/P. Pt denies hx of HTN and states it has only been too low in the past.

## 2023-01-18 ENCOUNTER — Emergency Department (HOSPITAL_COMMUNITY)
Admission: EM | Admit: 2023-01-18 | Discharge: 2023-01-19 | Disposition: A | Discharge status: Other Acute Inpatient Hospital | Payer: 59 | Attending: Emergency Medicine | Admitting: Emergency Medicine

## 2023-01-18 ENCOUNTER — Emergency Department (HOSPITAL_COMMUNITY): Payer: 59

## 2023-01-18 ENCOUNTER — Ambulatory Visit (HOSPITAL_COMMUNITY)
Admission: EM | Admit: 2023-01-18 | Discharge: 2023-01-18 | Disposition: A | Discharge status: Other Acute Inpatient Hospital | Payer: 59 | Attending: Family | Admitting: Family

## 2023-01-18 DIAGNOSIS — D649 Anemia, unspecified: Secondary | ICD-10-CM | POA: Insufficient documentation

## 2023-01-18 DIAGNOSIS — F5105 Insomnia due to other mental disorder: Secondary | ICD-10-CM

## 2023-01-18 DIAGNOSIS — R000 Tachycardia, unspecified: Secondary | ICD-10-CM | POA: Diagnosis not present

## 2023-01-18 DIAGNOSIS — E876 Hypokalemia: Secondary | ICD-10-CM | POA: Diagnosis not present

## 2023-01-18 DIAGNOSIS — Z79899 Other long term (current) drug therapy: Secondary | ICD-10-CM | POA: Insufficient documentation

## 2023-01-18 DIAGNOSIS — R441 Visual hallucinations: Secondary | ICD-10-CM | POA: Diagnosis not present

## 2023-01-18 DIAGNOSIS — G4700 Insomnia, unspecified: Secondary | ICD-10-CM | POA: Diagnosis not present

## 2023-01-18 DIAGNOSIS — R030 Elevated blood-pressure reading, without diagnosis of hypertension: Secondary | ICD-10-CM | POA: Insufficient documentation

## 2023-01-18 DIAGNOSIS — R440 Auditory hallucinations: Secondary | ICD-10-CM | POA: Insufficient documentation

## 2023-01-18 DIAGNOSIS — F1090 Alcohol use, unspecified, uncomplicated: Secondary | ICD-10-CM | POA: Diagnosis not present

## 2023-01-18 DIAGNOSIS — Z008 Encounter for other general examination: Secondary | ICD-10-CM

## 2023-01-18 DIAGNOSIS — F419 Anxiety disorder, unspecified: Secondary | ICD-10-CM | POA: Insufficient documentation

## 2023-01-18 DIAGNOSIS — F332 Major depressive disorder, recurrent severe without psychotic features: Secondary | ICD-10-CM | POA: Diagnosis not present

## 2023-01-18 DIAGNOSIS — R079 Chest pain, unspecified: Secondary | ICD-10-CM | POA: Diagnosis not present

## 2023-01-18 DIAGNOSIS — R1011 Right upper quadrant pain: Secondary | ICD-10-CM | POA: Diagnosis not present

## 2023-01-18 DIAGNOSIS — R1084 Generalized abdominal pain: Secondary | ICD-10-CM | POA: Diagnosis not present

## 2023-01-18 LAB — COMPREHENSIVE METABOLIC PANEL
ALT: 18 U/L (ref 0–44)
ALT: 16 U/L (ref 0–44)
AST: 20 U/L (ref 15–41)
AST: 16 U/L (ref 15–41)
Albumin: 4.8 g/dL (ref 3.5–5.0)
Albumin: 4.5 g/dL (ref 3.5–5.0)
Alkaline Phosphatase: 63 U/L (ref 38–126)
Alkaline Phosphatase: 55 U/L (ref 38–126)
Anion gap: 17 — ABNORMAL HIGH (ref 5–15)
Anion gap: 13 (ref 5–15)
BUN: <5 mg/dL — ABNORMAL LOW (ref 6–20)
BUN: <5 mg/dL — ABNORMAL LOW (ref 6–20)
CO2: 24 mmol/L (ref 22–32)
CO2: 21 mmol/L — ABNORMAL LOW (ref 22–32)
Calcium: 10.2 mg/dL (ref 8.9–10.3)
Calcium: 10 mg/dL (ref 8.9–10.3)
Chloride: 93 mmol/L — ABNORMAL LOW (ref 98–111)
Chloride: 102 mmol/L (ref 98–111)
Creatinine, Ser: 0.79 mg/dL (ref 0.44–1.00)
Creatinine, Ser: 0.54 mg/dL (ref 0.44–1.00)
GFR, Estimated: >60 mL/min (ref >60)
GFR, Estimated: >60 mL/min (ref >60)
Glucose, Bld: 172 mg/dL — ABNORMAL HIGH (ref 70–99)
Glucose, Bld: 110 mg/dL — ABNORMAL HIGH (ref 70–99)
Potassium: 3.2 mmol/L — ABNORMAL LOW (ref 3.5–5.1)
Potassium: 3.2 mmol/L — ABNORMAL LOW (ref 3.5–5.1)
Sodium: 134 mmol/L — ABNORMAL LOW (ref 135–145)
Sodium: 136 mmol/L (ref 135–145)
Total Bilirubin: 0.5 mg/dL (ref 0.3–1.2)
Total Bilirubin: 0.6 mg/dL (ref 0.3–1.2)
Total Protein: 8.3 g/dL — ABNORMAL HIGH (ref 6.5–8.1)
Total Protein: 8.1 g/dL (ref 6.5–8.1)

## 2023-01-18 LAB — CBC WITH DIFFERENTIAL/PLATELET
Abs Immature Granulocytes: 0.02 10*3/uL (ref 0.00–0.07)
Abs Immature Granulocytes: 0.03 10*3/uL (ref 0.00–0.07)
Basophils Absolute: 0 10*3/uL (ref 0.0–0.1)
Basophils Absolute: 0 10*3/uL (ref 0.0–0.1)
Basophils Relative: 0 %
Basophils Relative: 0 %
Eosinophils Absolute: 0 10*3/uL (ref 0.0–0.5)
Eosinophils Absolute: 0 10*3/uL (ref 0.0–0.5)
Eosinophils Relative: 0 %
Eosinophils Relative: 0 %
HCT: 36.7 % (ref 36.0–46.0)
HCT: 35.3 % — ABNORMAL LOW (ref 36.0–46.0)
Hemoglobin: 12.4 g/dL (ref 12.0–15.0)
Hemoglobin: 11.8 g/dL — ABNORMAL LOW (ref 12.0–15.0)
Immature Granulocytes: 0 %
Immature Granulocytes: 0 %
Lymphocytes Relative: 11 %
Lymphocytes Relative: 18 %
Lymphs Abs: 1 10*3/uL (ref 0.7–4.0)
Lymphs Abs: 1.7 10*3/uL (ref 0.7–4.0)
MCH: 29.8 pg (ref 26.0–34.0)
MCH: 29 pg (ref 26.0–34.0)
MCHC: 33.8 g/dL (ref 30.0–36.0)
MCHC: 33.4 g/dL (ref 30.0–36.0)
MCV: 88.2 fL (ref 80.0–100.0)
MCV: 86.7 fL (ref 80.0–100.0)
Monocytes Absolute: 0.3 10*3/uL (ref 0.1–1.0)
Monocytes Absolute: 0.7 10*3/uL (ref 0.1–1.0)
Monocytes Relative: 4 %
Monocytes Relative: 8 %
Neutro Abs: 7.1 10*3/uL (ref 1.7–7.7)
Neutro Abs: 6.7 10*3/uL (ref 1.7–7.7)
Neutrophils Relative %: 85 %
Neutrophils Relative %: 74 %
Platelets: 370 10*3/uL (ref 150–400)
Platelets: 342 10*3/uL (ref 150–400)
RBC: 4.16 MIL/uL (ref 3.87–5.11)
RBC: 4.07 MIL/uL (ref 3.87–5.11)
RDW: 13.2 % (ref 11.5–15.5)
RDW: 13 % (ref 11.5–15.5)
WBC: 8.5 10*3/uL (ref 4.0–10.5)
WBC: 9.2 10*3/uL (ref 4.0–10.5)
nRBC: 0 % (ref 0.0–0.2)
nRBC: 0 % (ref 0.0–0.2)

## 2023-01-18 LAB — ETHANOL
Alcohol, Ethyl (B): <10 mg/dL (ref <10)
Alcohol, Ethyl (B): <10 mg/dL (ref <10)

## 2023-01-18 LAB — TSH: TSH: 0.765 u[IU]/mL (ref 0.350–4.500)

## 2023-01-18 LAB — MAGNESIUM: Magnesium: 2.2 mg/dL (ref 1.7–2.4)

## 2023-01-18 LAB — ACETAMINOPHEN LEVEL: Acetaminophen (Tylenol), Serum: <10 ug/mL — ABNORMAL LOW (ref 10–30)

## 2023-01-18 LAB — SALICYLATE LEVEL: Salicylate Lvl: <7 mg/dL — ABNORMAL LOW (ref 7.0–30.0)

## 2023-01-18 LAB — HCG, SERUM, QUALITATIVE: Preg, Serum: NEGATIVE

## 2023-01-18 MED ORDER — SODIUM CHLORIDE 0.9 % IV BOLUS: 1000.0000 mL | Freq: Once | INTRAVENOUS | Status: DC

## 2023-01-18 MED ORDER — TRAZODONE HCL 50 MG PO TABS: 50.0000 mg | ORAL_TABLET | Freq: Every evening | ORAL | Status: DC | PRN

## 2023-01-18 MED ORDER — ACETAMINOPHEN 325 MG PO TABS: 650.0000 mg | ORAL_TABLET | Freq: Four times a day (QID) | ORAL | Status: DC | PRN

## 2023-01-18 MED ORDER — QUETIAPINE FUMARATE 25 MG PO TABS: 25.0000 mg | ORAL_TABLET | Freq: Two times a day (BID) | ORAL | Status: DC

## 2023-01-18 MED ORDER — POTASSIUM CHLORIDE CRYS ER 20 MEQ PO TBCR
40.0000 meq | EXTENDED_RELEASE_TABLET | Freq: Once | ORAL | Status: AC
  Administered 2023-01-18: 40 meq via ORAL
  Filled 2023-01-18: qty 2

## 2023-01-18 MED ORDER — ALUM & MAG HYDROXIDE-SIMETH 200-200-20 MG/5ML PO SUSP: 30.0000 mL | ORAL | Status: DC | PRN

## 2023-01-18 MED ORDER — HYDROXYZINE HCL 25 MG PO TABS
25.0000 mg | ORAL_TABLET | Freq: Three times a day (TID) | ORAL | Status: DC | PRN
  Administered 2023-01-18: 25 mg via ORAL
  Filled 2023-01-18: qty 1

## 2023-01-18 MED ORDER — MAGNESIUM HYDROXIDE 400 MG/5ML PO SUSP: 30.0000 mL | Freq: Every day | ORAL | Status: DC | PRN

## 2023-01-18 NOTE — ED Notes (Signed)
ivc'ed

## 2023-01-18 NOTE — ED Notes (Addendum)
Patient was transferred to [facility] via EMS and [employer] due to patient being IVC'd. Patient was anxious prior to leaving. Patient was given prn Vistaril which helped to calm her down. Patient has been easily re-directed  when pacing and asking to leave the facility.

## 2023-01-18 NOTE — ED Notes (Signed)
Pt's family was let back by security.  Pt's family was agitated that Paramedic would not give vitals to the security officer over the phone.  Paramedic explained she was unable to get to the Pt's room immediately and had another nurse draw labs on the Pt. Pt's family threw a fit at the nurses station and requested the paramedics name and the charge nurses name.  Both were given.  Family was escorted out and made contact with [employer] on the way out.  [employer] reiterated that family was not allowed with the Pt.

## 2023-01-18 NOTE — ED Notes (Signed)
PT vitals not assessed as Pt was sleeping and due to Pt behavior, it was not deemed neccessary to wake her up.

## 2023-01-18 NOTE — Discharge Instructions (Signed)
It was a pleasure caring for you today. Lab workup was reassuring. Tachycardia resolved with PO intake. Seek emergency care if experiencing any new or worsening symptoms.

## 2023-01-18 NOTE — ED Notes (Signed)
Ashley Cole (who pulled labs) stated that the Pt refused an IV, but stated she would drink water.  [name] talked to the PA who stated that was acceptable since the Pt agreed to drink water.

## 2023-01-18 NOTE — ED Notes (Signed)
Pt took off blood pressure cuff, stood up from wheelchair and stated she had to go outside. This RN attempted to redirect pt back to wheelchair, pt walked towards ambulance bay outside, [employer] present. [employer] assisted pt back to hallway.

## 2023-01-18 NOTE — ED Triage Notes (Signed)
Pt BIB [facility] from [facility] for medical clearance, pt seen there for auditory hallucinations, Pt is IVC, [employer] at bedside. Pt was found tachycardic 136 and hypertensive sent here for medical clearance  BP 154/94 HR 136 92 room air  CBG 169

## 2023-01-18 NOTE — ED Notes (Signed)
Attempted to call [facility] to confirm they were willing to accept.

## 2023-01-18 NOTE — ED Notes (Signed)
Pt refused to pee in the cup.  No urine sample collected.

## 2023-01-18 NOTE — ED Notes (Signed)
[employer]iffs department called and short voicemail regarding Pt and status left. Called [facility] and report given.  EMTALA and MED NEC need to be completed prior to departure.

## 2023-01-18 NOTE — ED Notes (Signed)
Ambulated to the bathroom with sitter.

## 2023-01-18 NOTE — ED Notes (Signed)
Pt's family called security to let them back.  Paramedic Roseanne Reno said no after discussing with [employer] officer. He stated that while he was here family was not allowed back.

## 2023-01-18 NOTE — ED Provider Notes (Signed)
[facility] AT [facility] Provider Note   CSN: [id number] Arrival date & time: 01/18/23  1535     History  No chief complaint on file.   Ashley Cole is a 37 y.o. female with PMHx GERD, headache, anxiety who presents to the ED sent from [facility] for medical clearance. [facility] concerned for patient's tachycardia and HTN. Patient currently IVC'd at [facility] for insomnia and auditory hallucinations. Patient currently denying SI/HI. Endorses auditory hallucinations.   Of note, patient seen in ED yesterday for RUQ abdominal pain. Patient received RUQ [place] which was negative.   Patient Denies fever, chest pain, dyspnea, cough, nausea, vomiting, diarrhea, dysuria, hematuria, hematochezia.   HPI     Home Medications Prior to Admission medications   Medication Sig Start Date End Date Taking? Authorizing Provider  escitalopram (LEXAPRO) 10 MG tablet Take 10 mg by mouth daily. Patient not taking: Reported on 01/18/2023 09/19/22   [provider]  FLUoxetine (PROZAC) 20 MG capsule  09/08/18 03/06/20  [provider]  fluticasone (FLONASE) 50 MCG/ACT nasal spray Place into both nostrils daily. Patient not taking: Reported on 12/17/2019  03/06/20  [provider]  montelukast (SINGULAIR) 10 MG tablet Take 10 mg by mouth at bedtime. Patient not taking: Reported on 12/17/2019  03/06/20  [provider]      Allergies    Patient has no known allergies.    Review of Systems   Review of Systems  Cardiovascular:        Tachycardia    Physical Exam Updated Vital Signs BP 105/73   Pulse 91   Temp 98.1 F (36.7 C) (Oral)   Resp 18   LMP 01/14/2023   SpO2 99%  Physical Exam Vitals and nursing note reviewed.  Constitutional:      General: She is not in acute distress.    Appearance: She is not ill-appearing or toxic-appearing.  HENT:     Head: Normocephalic and atraumatic.     Mouth/Throat:     Mouth: Mucous membranes are moist.   Eyes:     General: No scleral icterus.       Right eye: No discharge.        Left eye: No discharge.     Conjunctiva/sclera: Conjunctivae normal.  Cardiovascular:     Rate and Rhythm: Tachycardia present.     Pulses: Normal pulses.     Heart sounds: Normal heart sounds. No murmur heard. Pulmonary:     Effort: Pulmonary effort is normal. No respiratory distress.     Breath sounds: Normal breath sounds. No wheezing, rhonchi or rales.  Abdominal:     General: Abdomen is flat. Bowel sounds are normal. There is no distension.     Palpations: Abdomen is soft. There is no mass.     Comments: Mild tenderness to palpation of RUQ  Musculoskeletal:     Right lower leg: No edema.     Left lower leg: No edema.  Skin:    General: Skin is warm and dry.     Findings: No rash.  Neurological:     General: No focal deficit present.     Mental Status: She is alert. Mental status is at baseline.  Psychiatric:        Mood and Affect: Mood normal.     Comments: Tearful on exam     ED Results / Procedures / Treatments   Labs (all labs ordered are listed, but only abnormal results are displayed) Labs Reviewed  COMPREHENSIVE METABOLIC PANEL - Abnormal; Notable for the following components:      Result Value   Potassium 3.2 (*)    CO2 21 (*)    Glucose, Bld 110 (*)    BUN <5 (*)    All other components within normal limits  CBC WITH DIFFERENTIAL/PLATELET - Abnormal; Notable for the following components:   Hemoglobin 11.8 (*)    HCT 35.3 (*)    All other components within normal limits  SALICYLATE LEVEL - Abnormal; Notable for the following components:   Salicylate Lvl <7.0 (*)    All other components within normal limits  ACETAMINOPHEN LEVEL - Abnormal; Notable for the following components:   Acetaminophen (Tylenol), Serum <10 (*)    All other components within normal limits  ETHANOL  HCG, SERUM, QUALITATIVE  RAPID URINE DRUG SCREEN, HOSP PERFORMED    EKG EKG  Interpretation Date/Time:  Friday January 18 2023 16:11:59 EDT Ventricular Rate:  127 PR Interval:  119 QRS Duration:  88 QT Interval:  362 QTC Calculation: 527 R Axis:   80  Text Interpretation: Sinus tachycardia Ventricular premature complex Aberrant complex Prolonged QT interval Since last tracing of earlier today No significant change was found Confirmed by Elayne Snare ([id number]) on 01/18/2023 4:48:17 PM  Radiology DG Chest 1 View  Result Date: 01/18/2023 CLINICAL DATA:  Chest pain EXAM: CHEST  1 VIEW COMPARISON:  12/17/2019 FINDINGS: The heart size and mediastinal contours are within normal limits. Both lungs are clear. The visualized skeletal structures are unremarkable. IMPRESSION: No acute abnormality of the lungs. Electronically Signed   By: Jearld Lesch M.D.   On: 01/18/2023 16:13   US ABDOMEN LIMITED RUQ (LIVER/GB)  Result Date: 01/17/2023 CLINICAL DATA:  Right upper quadrant pain EXAM: ULTRASOUND ABDOMEN LIMITED RIGHT UPPER QUADRANT COMPARISON:  None Available. FINDINGS: Gallbladder: No gallstones or wall thickening visualized. No sonographic Murphy sign noted by sonographer. Common bile duct: Diameter: 2 mm Liver: No focal lesion identified. Within normal limits in parenchymal echogenicity. Portal vein is patent on color Doppler imaging with normal direction of blood flow towards the liver. Other: None. IMPRESSION: No gallstones or ductal dilatation. Electronically Signed   By: Karen Kays M.D.   On: 01/17/2023 17:45    Procedures Procedures    Medications Ordered in ED Medications  potassium chloride SA (KLOR-CON M) CR tablet 40 mEq (has no administration in time range)    ED Course/ Medical Decision Making/ A&P                                 Medical Decision Making Amount and/or Complexity of Data Reviewed Labs: ordered. Radiology: ordered.   This patient presents to the ED for psych evaluation, this involves an extensive number of treatment options, and is a  complaint that carries with it a high risk of complications and morbidity.  The differential diagnosis includes primary psychosis, substance-induced psychosis, mood disturbance, SI/HI.   Co morbidities that complicate the patient evaluation  GERD, headache, anxiety   Additional history obtained:  Additional history obtained from behavioral health note 01/18/23: patient IVC'd and sent to ED for further workup on tachycardia and HTN    Lab Tests:  I Ordered, and personally interpreted labs.  The pertinent results include:   UDS: Acetaminophen level: within normal limits Salicylate level: within normal limits CBC: mild anemia (11.8); no leukocytosis CMP: mild hypokalemia (3.2) hCG: negative ETOH: within normal limits  Imaging Studies ordered:  I ordered imaging studies including  -Chest xray: to assess for process contributing to patient's symptoms  I independently visualized and interpreted imaging I agree with the radiologist interpretation   Cardiac Monitoring: / EKG:  The patient was maintained on a cardiac monitor.  I personally viewed and interpreted the cardiac monitored which showed an underlying rhythm of: tachycardia   Problem List / ED Course / Critical interventions / Medication management  Patient presented for medical clearance from [facility] given tachycardia and HTN. Patient is endorsing auditory hallucinations. Denies SI/HI.  On my initial exam, the pt was linear in thought, appropriate in affect, and overall well-appearing. IVC placed by [facility] this morning. Patient immediately placed into ED psychiatric hold protocol including elopement precautions and vital sign monitoring. Patient afebrile. Patient initially with tachycardia in the 100's. No HTN. Per chart review patient is chronically tachycardic ranging from 90-110's. TSH obtained earlier today was within normal limits. Patient initially refusing IV fluids and requests to try oral hydration first.   Tachycardia resolved with oral rehydration. CBC with mild anemia, no leukocytosis. CMP with mild hypokalemia - provided patient with oral potassium supplement in ED. hCG negative. Acetaminophen, ETOH, and salicylate levels all reassuring. Chest xray without acute cardiopulmonary disease. EKG showing tachycardia without acute changes. Patient refusing to provide UDS. Overall patient appears healthy and ready for disposition of behavioral health. It seems her tachycardia was d/t mild dehydration. I have reviewed the patients home medicines and have made adjustments as needed Patient afebrile with stable vitals. Provided with return precautions. Patient stable for transfer back to behavioral health.    Social Determinants of Health:  none           Final Clinical Impression(s) / ED Diagnoses Final diagnoses:  Auditory hallucinations  Insomnia due to other mental disorder  Medical clearance for psychiatric admission    Rx / DC Orders ED Discharge Orders     None         Dorthy Cooler, [place] 01/18/23 2018    Elayne Snare K, DO 01/18/23 [id number]

## 2023-01-18 NOTE — ED Notes (Signed)
Ivc paperwork done download in epic  by me sent to court of clerk by faye documentation  noted in the binder in orange zone paperwork attached to the clip board in green original copy in the red folder

## 2023-01-18 NOTE — ED Notes (Signed)
Patient admitted into obs unit. Patient is independent with ADLs, steady gait, A&Ox4. Patient denies any current SI,HI, and A/V/H with no plan or intent but reports she was experiecncing non command auditory hallucinations yesterday. Patient oriented to unit and offered a meal. Patient denies any current pain or discomfort and remains cooperative on unit.

## 2023-01-18 NOTE — ED Notes (Signed)
Pt keeps taking off vitals equipment.

## 2023-01-18 NOTE — ED Notes (Signed)
Pt continues to remove bp cuff and pulse ox despite multiple attempts to put her on a monitor.

## 2023-01-18 NOTE — ED Notes (Addendum)
[facility] stated that they would pass her name along and they would call me back. They advised they were overrun with Pts and were not sure they could take her back.

## 2023-01-18 NOTE — ED Notes (Signed)
Provider called and gave report to provider at [facility]. Called to give report to the nurse.

## 2023-01-18 NOTE — ED Provider Notes (Signed)
[facility] Continuous Assessment Admission H&P  Date: 01/18/23 Patient Name: [facility] MRN: [id number] Chief Complaint: insomnia  Diagnoses:  Final diagnoses:  Severe auditory hallucinations    HPI: [facility] to [facility] for walk-in assessment.  Patient is accompanied by her mother, [name].  Patient prefers that her mother remain present during assessment.  Patient's mother shares that she [name]- patient's mother) insisted that patient present for crisis assessment after increasingly bizarre behavior for several days.  [name] is assessed by this nurse practitioner face-to-face.  She is seated in assessment area, no apparent distress.  She is alert and oriented, guarded but cooperative during assessment.  She presents with depressed and anxious mood, tearful affect.   Patient endorses insomnia worsening x 1 week. Sleeping less than 3 hours per night.  Patient's mother believes she may be sleeping less than 1 hour each day.  Behavior becoming difficult for her mother to manage. Patient presents with yelling and tearfulness, initially unable to participate in triage assessment.   [name] evaluated in emergency department on last night related to abdominal pain.  Per medical record patient left prior to completion of evaluation.  Patient is a limited historian at this time.  She denies physical complaints currently.  She has refused collection of her temperature also refuses to provide urine.  Tachycardic, current pulse rate 114.  Patient reports elevated blood pressure times several days.  Denies headache, denies shortness of breath, denies dizziness.  Patient has documented history of anxiety and depression.  She has been intermittently compliant with Lexapro 10 mg daily since discharge from [facility].  She is not linked with outpatient psychiatry currently, seeking new providers.  She was discharged [facility] in  April 2024.  She denies previous inpatient psychiatric hospitalization aside from [facility] in April 2024.  Family mental health history includes patient's great aunts and uncles several of whom have been diagnosed with bipolar disorder and schizophrenia.  Sharmaine denies suicidal and homicidal ideations.  She denies history of suicide attempts, denies history of nonsuicidal self-harm behavior.  She makes vague passive suicidal statements during assessment. Patient makes statements including "I cannot go on like this, I cannot do this anymore."   She endorses auditory hallucinations including "I hear a lot of different voices." Patient observed holding her hands over her ears, potentially responding to internal stimuli.  Patient reports auditory hallucinations worsening times more than 1 week.  She denies visual hallucinations.  There is no evidence of delusional thought content currently.   Ashley Cole resides in [place] with her 28 year old son.  61 year old son is currently with his father.  She denies access to weapons.  She is employed in [employer] has been unable to work for some undetermined amount of time.  She endorses alcohol use, an average of 1 drink an average of 3 times per week.  Most recent alcohol use several days ago, patient is unable to recall.  She denies substance use aside from alcohol.  Patient offered support and encouragement. Reviewed treatment plan to include involuntary commitment and inpatient psychiatric treatment, patient verbalizes understanding. Concern for potential underlying bipolar disorder, reviewed discontinue Lexapro with patient who agrees.  Discussed medications including hydroxyzine, trazodone and quetiapine, reviewed potential side effects and offered patient opportunity to ask questions. Patient verbalizes desire to remain full CODE STATUS.  Total Time spent with patient: 1 hour  Musculoskeletal  Strength & Muscle Tone:  within normal limits  Gait & Station: normal Patient leans: N/A  Psychiatric Specialty Exam  Presentation General Appearance:  Appropriate for Environment; Casual  Eye Contact: Fair  Speech: Clear and Coherent; Normal Rate  Speech Volume: Normal  Handedness: Right   Mood and Affect  Mood: Anxious; Depressed  Affect: Tearful   Thought Process  Thought Processes: Coherent; Goal Directed; Linear  Descriptions of Associations:Intact  Orientation:Full (Time, Place and Person)  Thought Content:Logical; WDL  Diagnosis of Schizophrenia or Schizoaffective disorder in past: No  Duration of Psychotic Symptoms: Less than six months  Hallucinations:Hallucinations: Auditory Description of Auditory Hallucinations: "lots of different voices"  Ideas of Reference:None  Suicidal Thoughts:Suicidal Thoughts: No  Homicidal Thoughts:Homicidal Thoughts: No   Sensorium  Memory: Immediate Fair  Judgment: Intact  Insight: Shallow   Executive Functions  Concentration: Good  Attention Span: Good  Recall: Good  Fund of Knowledge: Good  Language: Good   Psychomotor Activity  Psychomotor Activity: Psychomotor Activity: Normal   Assets  Assets: Communication Skills; Desire for Improvement; Financial Resources/Insurance; Housing; Physical Health; Resilience; Social Support   Sleep  Sleep: Sleep: Poor Number of Hours of Sleep: 3   Nutritional Assessment (For OBS and FBC admissions only) Has the patient had a weight loss or gain of 10 pounds or more in the last 3 months?: No Has the patient had a decrease in food intake/or appetite?: No Does the patient have dental problems?: No Does the patient have eating habits or behaviors that may be indicators of an eating disorder including binging or inducing vomiting?: No Has the patient recently lost weight without trying?: 0 Has the patient been eating poorly because of a decreased appetite?: 0 Malnutrition  Screening Tool Score: 0    Physical Exam Vitals and nursing note reviewed.  Constitutional:      Appearance: Normal appearance. She is well-developed.  HENT:     Head: Normocephalic and atraumatic.     Nose: Nose normal.  Cardiovascular:     Rate and Rhythm: Normal rate.  Pulmonary:     Effort: Pulmonary effort is normal.  Musculoskeletal:        General: Normal range of motion.     Cervical back: Normal range of motion.  Neurological:     Mental Status: She is alert and oriented to person, place, and time.  Psychiatric:        Attention and Perception: Attention normal. She perceives auditory hallucinations.        Mood and Affect: Mood is anxious and depressed. Affect is tearful.        Speech: Speech normal.        Behavior: Behavior is cooperative.        Cognition and Memory: Cognition normal.    Review of Systems  Constitutional: Negative.   HENT: Negative.    Eyes: Negative.   Respiratory: Negative.    Cardiovascular: Negative.   Gastrointestinal: Negative.   Genitourinary: Negative.   Musculoskeletal: Negative.   Skin: Negative.   Neurological: Negative.   Psychiatric/Behavioral:  The patient is nervous/anxious and has insomnia.     Blood pressure (!) 163/84, pulse (!) 114, resp. rate 20, last menstrual period 01/14/2023, SpO2 98%. There is no height or weight on file to calculate BMI.  Past Psychiatric History: anxiety, brief psychotic disorder, anxious mood as adjustment reaction  Is the patient at risk to self? No  Has the patient been a risk to self in the past 6 months? No .    Has the patient been a  risk to self within the distant past? No   Is the patient a risk to others? No   Has the patient been a risk to others in the past 6 months? No   Has the patient been a risk to others within the distant past? No   Past Medical History: Anemia, abdominal pain, GERD, chronic idiopathic constipation, asymptomatic microscopic hematuria, shortness of  breath  Family History: Distant relatives including mother's aunts and uncles have been diagnosed with bipolar disorder and schizophrenia  Social History: Employed, resides with family, endorses alcohol use approximately 3 times per month  Last Labs:  Admission on 01/18/2023  Component Date Value Ref Range Status   WBC 01/18/2023 8.5  4.0 - 10.5 K/uL Final   RBC 01/18/2023 4.16  3.87 - 5.11 MIL/uL Final   Hemoglobin 01/18/2023 12.4  12.0 - 15.0 g/dL Final   HCT 28/41/3244 36.7  36.0 - 46.0 % Final   MCV 01/18/2023 88.2  80.0 - 100.0 fL Final   MCH 01/18/2023 29.8  26.0 - 34.0 pg Final   MCHC 01/18/2023 33.8  30.0 - 36.0 g/dL Final   RDW 06/06/7251 13.2  11.5 - 15.5 % Final   Platelets 01/18/2023 370  150 - 400 K/uL Final   nRBC 01/18/2023 0.0  0.0 - 0.2 % Final   Neutrophils Relative % 01/18/2023 85  % Final   Neutro Abs 01/18/2023 7.1  1.7 - 7.7 K/uL Final   Lymphocytes Relative 01/18/2023 11  % Final   Lymphs Abs 01/18/2023 1.0  0.7 - 4.0 K/uL Final   Monocytes Relative 01/18/2023 4  % Final   Monocytes Absolute 01/18/2023 0.3  0.1 - 1.0 K/uL Final   Eosinophils Relative 01/18/2023 0  % Final   Eosinophils Absolute 01/18/2023 0.0  0.0 - 0.5 K/uL Final   Basophils Relative 01/18/2023 0  % Final   Basophils Absolute 01/18/2023 0.0  0.0 - 0.1 K/uL Final   Immature Granulocytes 01/18/2023 0  % Final   Abs Immature Granulocytes 01/18/2023 0.02  0.00 - 0.07 K/uL Final   Performed at [facility], [address], [place], [place] [address]   Sodium 01/18/2023 134 (L)  135 - 145 mmol/L Final   Potassium 01/18/2023 3.2 (L)  3.5 - 5.1 mmol/L Final   Chloride 01/18/2023 93 (L)  98 - 111 mmol/L Final   CO2 01/18/2023 24  22 - 32 mmol/L Final   Glucose, Bld 01/18/2023 172 (H)  70 - 99 mg/dL Final   Glucose reference range applies only to samples taken after fasting for at least 8 hours.   BUN 01/18/2023 <5 (L)  6 - 20 mg/dL Final   Creatinine, Ser 01/18/2023 0.79  0.44 - 1.00 mg/dL  Final   Calcium 34/74/2595 10.2  8.9 - 10.3 mg/dL Final   Total Protein 63/87/5643 8.3 (H)  6.5 - 8.1 g/dL Final   Albumin 32/95/1884 4.8  3.5 - 5.0 g/dL Final   AST 16/60/6301 20  15 - 41 U/L Final   ALT 01/18/2023 18  0 - 44 U/L Final   Alkaline Phosphatase 01/18/2023 63  38 - 126 U/L Final   Total Bilirubin 01/18/2023 0.5  0.3 - 1.2 mg/dL Final   GFR, Estimated 01/18/2023 >60  >60 mL/min Final   Comment: (NOTE) Calculated using the CKD-EPI Creatinine Equation (2021)    Anion gap 01/18/2023 17 (H)  5 - 15 Final   Performed at [facility], [address], [place], [place] [address]  Magnesium 01/18/2023 2.2  1.7 - 2.4 mg/dL Final   Performed at [facility], [address] [place], [place] [address]   Alcohol, Ethyl (B) 01/18/2023 <10  <10 mg/dL Final   Comment: (NOTE) Lowest detectable limit for serum alcohol is 10 mg/dL.  For medical purposes only. Performed at [facility], [address], [place], [place] [address]    TSH 01/18/2023 0.765  0.350 - 4.500 uIU/mL Final   Comment: Performed by a 3rd Generation assay with a functional sensitivity of <=0.01 uIU/mL. Performed at [facility], [address] [place], [place] [address]   Admission on 01/17/2023, Discharged on 01/17/2023  Component Date Value Ref Range Status   Sodium 01/17/2023 138  135 - 145 mmol/L Final   Potassium 01/17/2023 3.5  3.5 - 5.1 mmol/L Final   Chloride 01/17/2023 102  98 - 111 mmol/L Final   CO2 01/17/2023 23  22 - 32 mmol/L Final   Glucose, Bld 01/17/2023 134 (H)  70 - 99 mg/dL Final   Glucose reference range applies only to samples taken after fasting for at least 8 hours.   BUN 01/17/2023 <5 (L)  6 - 20 mg/dL Final   Creatinine, Ser 01/17/2023 0.77  0.44 - 1.00 mg/dL Final   Calcium 18/84/1660 9.7  8.9 - 10.3 mg/dL Final   GFR, Estimated 01/17/2023 >60  >60 mL/min Final   Comment: (NOTE) Calculated using the CKD-EPI Creatinine Equation (2021)    Anion gap 01/17/2023  13  5 - 15 Final   Performed at [facility], [address] [place], [place] [address]   WBC 01/17/2023 7.4  4.0 - 10.5 K/uL Final   RBC 01/17/2023 4.16  3.87 - 5.11 MIL/uL Final   Hemoglobin 01/17/2023 12.7  12.0 - 15.0 g/dL Final   HCT 06/12/3233 37.7  36.0 - 46.0 % Final   MCV 01/17/2023 90.6  80.0 - 100.0 fL Final   MCH 01/17/2023 30.5  26.0 - 34.0 pg Final   MCHC 01/17/2023 33.7  30.0 - 36.0 g/dL Final   RDW 57/32/2025 13.2  11.5 - 15.5 % Final   Platelets 01/17/2023 357  150 - 400 K/uL Final   nRBC 01/17/2023 0.0  0.0 - 0.2 % Final   Performed at [facility], [address], [place], [place] [address]   Troponin I (High Sensitivity) 01/17/2023 2  <18 ng/L Final   Comment: (NOTE) Elevated high sensitivity troponin I (hsTnI) values and significant  changes across serial measurements may suggest ACS but many other  chronic and acute conditions are known to elevate hsTnI results.  Refer to the "Links" section for chest pain algorithms and additional  guidance. Performed at [facility], [address] [place], [place] [address]    Preg, Serum 01/17/2023 NEGATIVE  NEGATIVE Final   Comment:        THE SENSITIVITY OF THIS METHODOLOGY IS >10 mIU/mL. Performed at [facility], [address], [place], [place] [address]    Troponin I (High Sensitivity) 01/17/2023 3  <18 ng/L Final   Comment: (NOTE) Elevated high sensitivity troponin I (hsTnI) values and significant  changes across serial measurements may suggest ACS but many other  chronic and acute conditions are known to elevate hsTnI results.  Refer to the "Links" section for chest pain algorithms and additional  guidance. Performed at [facility], [address], [place], [place] [address]   Office Visit on  01/14/2023  Component Date Value Ref Range Status   Urine Culture, Routine 01/14/2023 Final report   Final   Organism ID, Bacteria 01/14/2023 Lactobacillus species   Final   Comment:  25,000-50,000 colony forming units per mL Susceptibility not normally performed on this organism.    Color, UA 01/14/2023 yellow  yellow Final   Clarity, UA 01/14/2023 clear  clear Final   Glucose, UA 01/14/2023 negative  negative mg/dL Final   Bilirubin, UA 02/72/5366 negative  negative Final   Ketones, POC UA 01/14/2023 negative  negative mg/dL Final   Spec Grav, UA 44/08/4740 1.020  1.010 - 1.025 Final   Blood, UA 01/14/2023 moderate (A)  negative Final   pH, UA 01/14/2023 7.0  5.0 - 8.0 Final   POC PROTEIN,UA 01/14/2023 negative  negative, trace Final   Urobilinogen, UA 01/14/2023 0.2  0.2 or 1.0 E.U./dL Final   Nitrite, UA 59/56/3875 Negative  Negative Final   Leukocytes, UA 01/14/2023 Negative  Negative Final   TSH 01/14/2023 1.240  0.450 - 4.500 uIU/mL Final   HIV Screen 4th Generation wRfx 01/14/2023 Non Reactive  Non Reactive Final   Comment: HIV-1/HIV-2 antibodies and HIV-1 p24 antigen were NOT detected. There is no laboratory evidence of HIV infection. HIV Negative    RPR Ser Ql 01/14/2023 Non Reactive  Non Reactive Final   Neisseria Gonorrhea 01/14/2023 Negative   Final   Chlamydia 01/14/2023 Negative   Final   Trichomonas 01/14/2023 Negative   Final   Bacterial Vaginitis (gardnerella) 01/14/2023 Negative   Final   Candida Vaginitis 01/14/2023 Negative   Final   Candida Glabrata 01/14/2023 Negative   Final   Comment 01/14/2023 Normal Reference Range Bacterial Vaginosis - Negative   Final   Comment 01/14/2023 Normal Reference Range Candida Species - Negative   Final   Comment 01/14/2023 Normal Reference Range Candida Galbrata - Negative   Final   Comment 01/14/2023 Normal Reference Range Trichomonas - Negative   Final   Comment 01/14/2023 Normal Reference Ranger Chlamydia - Negative   Final   Comment 01/14/2023 Normal Reference Range Neisseria Gonorrhea - Negative   Final   Glucose 01/14/2023 114 (H)  70 - 99 mg/dL Final   BUN 64/33/2951 4 (L)  6 - 20 mg/dL Final    Creatinine, Ser 01/14/2023 0.70  0.57 - 1.00 mg/dL Final   eGFR 88/41/6606 114  >59 mL/min/1.73 Final   BUN/Creatinine Ratio 01/14/2023 6 (L)  9 - 23 Final   Sodium 01/14/2023 135  134 - 144 mmol/L Final   Potassium 01/14/2023 3.7  3.5 - 5.2 mmol/L Final   Chloride 01/14/2023 100  96 - 106 mmol/L Final   CO2 01/14/2023 19 (L)  20 - 29 mmol/L Final   Calcium 01/14/2023 9.9  8.7 - 10.2 mg/dL Final  Office Visit on 10/24/2022  Component Date Value Ref Range Status   HIV Screen 4th Generation wRfx 10/24/2022 Non Reactive  Non Reactive Final   Comment: HIV-1/HIV-2 antibodies and HIV-1 p24 antigen were NOT detected. There is no laboratory evidence of HIV infection. HIV Negative    RPR Ser Ql 10/24/2022 Non Reactive  Non Reactive Final   Neisseria Gonorrhea 10/24/2022 Negative   Final   Chlamydia 10/24/2022 Negative   Final   Trichomonas 10/24/2022 Negative   Final   Bacterial Vaginitis (gardnerella) 10/24/2022 Positive (A)   Final   Candida Vaginitis 10/24/2022 Positive (A)   Final   Candida Glabrata 10/24/2022 Negative   Final   Comment 10/24/2022 Normal Reference  Range Bacterial Vaginosis - Negative   Final   Comment 10/24/2022 Normal Reference Range Candida Species - Negative   Final   Comment 10/24/2022 Normal Reference Range Candida Galbrata - Negative   Final   Comment 10/24/2022 Normal Reference Range Trichomonas - Negative   Final   Comment 10/24/2022 Normal Reference Ranger Chlamydia - Negative   Final   Comment 10/24/2022 Normal Reference Range Neisseria Gonorrhea - Negative   Final  Admission on 09/12/2022, Discharged on 09/13/2022  Component Date Value Ref Range Status   Sodium 09/12/2022 136  135 - 145 mmol/L Final   Potassium 09/12/2022 3.1 (L)  3.5 - 5.1 mmol/L Final   Chloride 09/12/2022 101  98 - 111 mmol/L Final   CO2 09/12/2022 25  22 - 32 mmol/L Final   Glucose, Bld 09/12/2022 102 (H)  70 - 99 mg/dL Final   Glucose reference range applies only to samples taken after  fasting for at least 8 hours.   BUN 09/12/2022 6  6 - 20 mg/dL Final   Creatinine, Ser 09/12/2022 0.74  0.44 - 1.00 mg/dL Final   Calcium 87/56/4332 9.4  8.9 - 10.3 mg/dL Final   Total Protein 95/18/8416 7.8  6.5 - 8.1 g/dL Final   Albumin 60/63/0160 4.5  3.5 - 5.0 g/dL Final   AST 10/93/2355 18  15 - 41 U/L Final   ALT 09/12/2022 20  0 - 44 U/L Final   Alkaline Phosphatase 09/12/2022 46  38 - 126 U/L Final   Total Bilirubin 09/12/2022 0.9  0.3 - 1.2 mg/dL Final   GFR, Estimated 09/12/2022 >60  >60 mL/min Final   Comment: (NOTE) Calculated using the CKD-EPI Creatinine Equation (2021)    Anion gap 09/12/2022 10  5 - 15 Final   Performed at [facility], [address] [place], [place] [address]   Alcohol, Ethyl (B) 09/12/2022 <10  <10 mg/dL Final   Comment: (NOTE) Lowest detectable limit for serum alcohol is 10 mg/dL.  For medical purposes only. Performed at [facility], [address] [place], [place] [address]    WBC 09/12/2022 6.4  4.0 - 10.5 K/uL Final   RBC 09/12/2022 4.13  3.87 - 5.11 MIL/uL Final   Hemoglobin 09/12/2022 12.4  12.0 - 15.0 g/dL Final   HCT 11/25/7626 36.9  36.0 - 46.0 % Final   MCV 09/12/2022 89.3  80.0 - 100.0 fL Final   MCH 09/12/2022 30.0  26.0 - 34.0 pg Final   MCHC 09/12/2022 33.6  30.0 - 36.0 g/dL Final   RDW 31/51/7616 12.8  11.5 - 15.5 % Final   Platelets 09/12/2022 306  150 - 400 K/uL Final   nRBC 09/12/2022 0.0  0.0 - 0.2 % Final   Neutrophils Relative % 09/12/2022 52  % Final   Neutro Abs 09/12/2022 3.3  1.7 - 7.7 K/uL Final   Lymphocytes Relative 09/12/2022 37  % Final   Lymphs Abs 09/12/2022 2.3  0.7 - 4.0 K/uL Final   Monocytes Relative 09/12/2022 10  % Final   Monocytes Absolute 09/12/2022 0.6  0.1 - 1.0 K/uL Final   Eosinophils Relative 09/12/2022 1  % Final   Eosinophils Absolute 09/12/2022 0.0  0.0 - 0.5 K/uL Final   Basophils Relative 09/12/2022 0  % Final   Basophils Absolute 09/12/2022 0.0   0.0 - 0.1 K/uL Final   Immature Granulocytes 09/12/2022 0  % Final   Abs Immature Granulocytes 09/12/2022 0.01  0.00 - 0.07 K/uL Final  Performed at [facility], [address], [place], [place] [address]   I-stat hCG, quantitative 09/12/2022 <5.0  <5 mIU/mL Final   Comment 3 09/12/2022          Final   Comment:   GEST. AGE      CONC.  (mIU/mL)   <=1 WEEK        5 - 50     2 WEEKS       50 - 500     3 WEEKS       100 - 10,000     4 WEEKS     1,000 - 30,000        FEMALE AND NON-PREGNANT FEMALE:     LESS THAN 5 mIU/mL    Acetaminophen (Tylenol), Serum 09/12/2022 <10 (L)  10 - 30 ug/mL Final   Comment: (NOTE) Therapeutic concentrations vary significantly. A range of 10-30 ug/mL  may be an effective concentration for many patients. However, some  are best treated at concentrations outside of this range. Acetaminophen concentrations >150 ug/mL at 4 hours after ingestion  and >50 ug/mL at 12 hours after ingestion are often associated with  toxic reactions.  Performed at [facility], [address], [place], [place] [address]    Salicylate Lvl 09/12/2022 <7.0 (L)  7.0 - 30.0 mg/dL Final   Performed at [facility], [address], [place], [place] [address]   SARS Coronavirus 2 by RT PCR 09/13/2022 NEGATIVE  NEGATIVE Final   Comment: (NOTE) SARS-CoV-2 target nucleic acids are NOT DETECTED.  The SARS-CoV-2 RNA is generally detectable in upper and lower respiratory specimens during the acute phase of infection. The lowest concentration of SARS-CoV-2 viral copies this assay can detect is 250 copies / mL. A negative result does not preclude SARS-CoV-2 infection and should not be used as the sole basis for treatment or other patient management decisions.  A negative result may occur with improper specimen collection / handling, submission of specimen other than nasopharyngeal swab, presence of viral mutation(s) within the areas  targeted by this assay, and inadequate number of viral copies (<250 copies / mL). A negative result must be combined with clinical observations, patient history, and epidemiological information.  Fact Sheet for Patients:   RoadLapTop.co.za  Fact Sheet for Healthcare Providers: http://kim-miller.com/  This test is not yet approved or                           cleared by the [place] FDA and has been authorized for detection and/or diagnosis of SARS-CoV-2 by FDA under an Emergency Use Authorization (EUA).  This EUA will remain in effect (meaning this test can be used) for the duration of the COVID-19 declaration under Section 564(b)(1) of the Act, 21 U.S.C. section 360bbb-3(b)(1), unless the authorization is terminated or revoked sooner.  Performed at [facility], [address], [place], [place] [address]     Allergies: Patient has no known allergies.  Medications:  Facility Ordered Medications  Medication   acetaminophen (TYLENOL) tablet 650 mg   alum & mag hydroxide-simeth (MAALOX/MYLANTA) 200-200-20 MG/5ML suspension 30 mL   magnesium hydroxide (MILK OF MAGNESIA) suspension 30 mL   hydrOXYzine (ATARAX) tablet 25 mg   traZODone (DESYREL) tablet 50 mg   PTA Medications  Medication Sig   escitalopram (LEXAPRO) 10 MG tablet Take 10 mg by mouth daily. (Patient not taking: Reported on 01/18/2023)  Medical Decision Making  Patient reviewed with Dr. Clovis Riley.  Involuntary commitment petition initiated by this Clinical research associate.  Inpatient psychiatric hospitalization recommended  Laboratory studies ordered including CBC, CMP, ethanol, magnesium and TSH.  Urine pregnancy, UA and urine drug screen ordered.  EKG order initiated.  Current medications: -Acetaminophen 650 mg every 6 as needed/mild pain -Maalox 30 mL oral every 4 as needed/digestion -Hydroxyzine 25 mg 3 times daily as needed/anxiety -Magnesium hydroxide  30 mL daily as needed/mild constipation -Trazodone 50 mg nightly as needed/sleep  -Quetiapine 25 mg twice daily/mood       Recommendations  Based on my evaluation the patient appears to have an emergency medical condition for which I recommend the patient be transferred to the emergency department for further evaluation.  Patient accepted to [facility] ED by Dr Wallace Cullens for medical clearance. Patient left ED AMA yesterday c/o abdominal pain. Refuses temperature and UDS currently.  She is limited historian at this time. She is appropriate to return to [facility] once medically cleared. Will recommend inpatient admission once medically cleared, under review at [facility].   Lenard Lance, FNP 01/18/23  12:11 PM

## 2023-01-18 NOTE — ED Notes (Signed)
Assumed care of patient. Sitter is at bedside. Patient is asleep.
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
## 2023-01-18 NOTE — ED Notes (Signed)
Patient came in ivc from [facility] case number [id number] no IVC order placed yet so I place it in the chart pt is here for  medical clearnce

## 2023-01-18 NOTE — Progress Notes (Signed)
   01/18/23 0758  [facility] Triage Screening (Walk-ins at [facility] only)  How Did You Hear About [place]? Family/Friend  What Is the Reason for Your Visit/Call Today? Pt presents to [facility] voluntarily accompanied by her mom due to paranoia and hallucinations. Pt unable to verbalize her symptoms, Pt is screaming, shaking, crying and jumping up and down. Pt only states "Help me" but cannot elaborate on what he needs help with. Pts mother states the pt has been staying with her for the past week due to her symptoms and has not slept the whole week that she has been at her home. Pts mother states "she's been up all night walking around". Pts mother also reports the pt called the [facility] department yesterday stating that she doesn't feel safe. She reports she took her to the hospital yesterday but she did not stay. Unable to assess further due to presentation. NP notified.  How Long Has This Been Causing You Problems? 1-6 months  Have You Recently Had Any Thoughts About Hurting Yourself?  (UTA)  Are You Planning to Commit Suicide/Harm Yourself At This time?  (UTA)  Have you Recently Had Thoughts About Hurting Someone Else?  (UTA)  Are You Planning To Harm Someone At This Time?  (UTA)  Are you currently experiencing any auditory, visual or other hallucinations?  (UTA)  Have You Used Any Alcohol or Drugs in the Past 24 Hours?  (UTA)  Do you have any current medical co-morbidities that require immediate attention?  (UTA)  Clinician description of patient physical appearance/behavior: screaming, jumping up and down, crying, shaking  What Do You Feel Would Help You the Most Today? Treatment for Depression or other mood problem  If access to [facility] was not available, would you have sought care in the Emergency Department? No  Determination of Need Emergent (2 hours)  Options For Referral Inpatient Hospitalization

## 2023-01-19 ENCOUNTER — Other Ambulatory Visit: Payer: Self-pay

## 2023-01-19 ENCOUNTER — Encounter (HOSPITAL_COMMUNITY): Payer: Self-pay | Admitting: Family

## 2023-01-19 ENCOUNTER — Inpatient Hospital Stay (HOSPITAL_COMMUNITY)
Admission: RE | Admit: 2023-01-19 | Discharge: 2023-01-24 | DRG: 885 | Disposition: A | Discharge status: Home / Self Care | Payer: 59 | Attending: Psychiatry | Admitting: Psychiatry

## 2023-01-19 DIAGNOSIS — F411 Generalized anxiety disorder: Secondary | ICD-10-CM | POA: Diagnosis present

## 2023-01-19 DIAGNOSIS — F23 Brief psychotic disorder: Secondary | ICD-10-CM | POA: Diagnosis not present

## 2023-01-19 DIAGNOSIS — F3164 Bipolar disorder, current episode mixed, severe, with psychotic features: Principal | ICD-10-CM | POA: Diagnosis present

## 2023-01-19 DIAGNOSIS — F17210 Nicotine dependence, cigarettes, uncomplicated: Secondary | ICD-10-CM | POA: Diagnosis not present

## 2023-01-19 DIAGNOSIS — Z91414 Personal history of adult intimate partner abuse: Secondary | ICD-10-CM

## 2023-01-19 DIAGNOSIS — Z8349 Family history of other endocrine, nutritional and metabolic diseases: Secondary | ICD-10-CM | POA: Diagnosis not present

## 2023-01-19 DIAGNOSIS — Z91128 Patient's intentional underdosing of medication regimen for other reason: Secondary | ICD-10-CM

## 2023-01-19 DIAGNOSIS — Z635 Disruption of family by separation and divorce: Secondary | ICD-10-CM | POA: Diagnosis not present

## 2023-01-19 DIAGNOSIS — K5900 Constipation, unspecified: Secondary | ICD-10-CM | POA: Diagnosis not present

## 2023-01-19 DIAGNOSIS — Z79899 Other long term (current) drug therapy: Secondary | ICD-10-CM

## 2023-01-19 DIAGNOSIS — Z8249 Family history of ischemic heart disease and other diseases of the circulatory system: Secondary | ICD-10-CM

## 2023-01-19 DIAGNOSIS — Z818 Family history of other mental and behavioral disorders: Secondary | ICD-10-CM | POA: Diagnosis not present

## 2023-01-19 DIAGNOSIS — G4700 Insomnia, unspecified: Secondary | ICD-10-CM | POA: Diagnosis not present

## 2023-01-19 DIAGNOSIS — Z91199 Patient's noncompliance with other medical treatment and regimen due to unspecified reason: Secondary | ICD-10-CM

## 2023-01-19 MED ORDER — HALOPERIDOL LACTATE 5 MG/ML IJ SOLN: 5.0000 mg | Freq: Three times a day (TID) | INTRAMUSCULAR | Status: DC | PRN

## 2023-01-19 MED ORDER — MAGNESIUM HYDROXIDE 400 MG/5ML PO SUSP: 30.0000 mL | Freq: Every day | ORAL | Status: DC | PRN

## 2023-01-19 MED ORDER — TRAZODONE HCL 50 MG PO TABS
50.0000 mg | ORAL_TABLET | Freq: Every day | ORAL | Status: DC
  Administered 2023-01-19: 50 mg via ORAL
  Filled 2023-01-19 (×4): qty 1

## 2023-01-19 MED ORDER — ALUM & MAG HYDROXIDE-SIMETH 200-200-20 MG/5ML PO SUSP: 30.0000 mL | ORAL | Status: DC | PRN

## 2023-01-19 MED ORDER — TRAZODONE HCL 50 MG PO TABS: 50.0000 mg | ORAL_TABLET | Freq: Every evening | ORAL | Status: DC | PRN

## 2023-01-19 MED ORDER — POTASSIUM CHLORIDE CRYS ER 20 MEQ PO TBCR
40.0000 meq | EXTENDED_RELEASE_TABLET | Freq: Once | ORAL | Status: AC
  Administered 2023-01-19: 40 meq via ORAL
  Filled 2023-01-19: qty 2

## 2023-01-19 MED ORDER — LORAZEPAM 2 MG/ML IJ SOLN: 2.0000 mg | Freq: Three times a day (TID) | INTRAMUSCULAR | Status: DC | PRN

## 2023-01-19 MED ORDER — ACETAMINOPHEN 325 MG PO TABS: 650.0000 mg | ORAL_TABLET | Freq: Four times a day (QID) | ORAL | Status: DC | PRN

## 2023-01-19 MED ORDER — DIPHENHYDRAMINE HCL 50 MG/ML IJ SOLN: 50.0000 mg | Freq: Three times a day (TID) | INTRAMUSCULAR | Status: DC | PRN

## 2023-01-19 MED ORDER — HALOPERIDOL 5 MG PO TABS: 5.0000 mg | ORAL_TABLET | Freq: Three times a day (TID) | ORAL | Status: DC | PRN

## 2023-01-19 MED ORDER — DIPHENHYDRAMINE HCL 25 MG PO CAPS: 50.0000 mg | ORAL_CAPSULE | Freq: Three times a day (TID) | ORAL | Status: DC | PRN

## 2023-01-19 MED ORDER — HYDROXYZINE HCL 25 MG PO TABS: 25.0000 mg | ORAL_TABLET | Freq: Three times a day (TID) | ORAL | Status: DC | PRN

## 2023-01-19 MED ORDER — ARIPIPRAZOLE 5 MG PO TABS
5.0000 mg | ORAL_TABLET | Freq: Every day | ORAL | Status: DC
  Administered 2023-01-19 – 2023-01-20 (×2): 5 mg via ORAL
  Filled 2023-01-19 (×5): qty 1

## 2023-01-19 MED ORDER — LORAZEPAM 1 MG PO TABS: 2.0000 mg | ORAL_TABLET | Freq: Three times a day (TID) | ORAL | Status: DC | PRN

## 2023-01-19 NOTE — ED Notes (Signed)
Patient is now stating she is not leaving. Stated she wanted to wait for her mother to get here. This nurse called her mother twice with no answer. [employer] is at bedside speaking with patient.

## 2023-01-19 NOTE — ED Notes (Signed)
Patient accepted to [id number] by Doran Heater NP. Attending [place] MD. Dx bipolar depression; severe A/H

## 2023-01-19 NOTE — [facility] Suicide Risk Assessment (Signed)
[facility] INPATIENT:  Family/Significant Other Suicide Prevention Education  Suicide Prevention Education:  Patient Refusal for Family/Significant Other Suicide Prevention Education: The patient Ashley Cole has refused to provide written consent for family/significant other to be provided Family/Significant Other Suicide Prevention Education during admission and/or prior to discharge.  Physician notified.  CSW has discussed with Pt, Patient was able to contract for safety upon discharge, Pt is possibly planning to go back home to her house instead of going back with her mom.   Steffanie Dunn LCSWA 01/19/2023, 10:23 AM
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
## 2023-01-19 NOTE — ED Notes (Signed)
Spoke with the patient. I allowed the patient to call her mom. Still no answer. Patient is leaving with [facility].

## 2023-01-19 NOTE — Progress Notes (Signed)
EKG complete copy placed in pts chart. Dorris NP made aware

## 2023-01-19 NOTE — ED Notes (Signed)
[facility] called for transport 

## 2023-01-19 NOTE — [facility] Group Notes (Signed)
[facility] Group Notes:  (Nursing/MHT/Case Management/Adjunct)  Date:  01/19/2023  Time:  10:40 PM  Type of Therapy:  Psychoeducational Skills  Participation Level:  Active  Participation Quality:  Appropriate  Affect:  Flat  Cognitive:  Appropriate  Insight:  Improving  Engagement in Group:  Engaged  Modes of Intervention:  Support  Summary of Progress/Problems: The patient rated her day as a 5 out of a possible 10. She states that she had a good visit with her mother this evening. Her goal for tomorrow is to get better and make some progress.   Ashley Cole 01/19/2023, 10:40 PM
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
## 2023-01-19 NOTE — Progress Notes (Signed)
   01/19/23 0926  Psych Admission Type (Psych Patients Only)  Admission Status Involuntary  Psychosocial Assessment  Patient Complaints Depression;Anxiety  Eye Contact Fair  Facial Expression Flat  Affect Sad;Flat  Speech Logical/coherent  Interaction Minimal  Motor Activity Slow  Appearance/Hygiene Unremarkable  Behavior Characteristics Cooperative  Mood Depressed  Thought Process  Coherency WDL  Content WDL  Delusions None reported or observed  Perception WDL  Hallucination None reported or observed  Judgment WDL  Confusion None  Danger to Self  Current suicidal ideation? Denies  Agreement Not to Harm Self Yes  Description of Agreement verbal  Danger to Others  Danger to Others None reported or observed

## 2023-01-19 NOTE — Progress Notes (Signed)
Pts urine placed in fridge

## 2023-01-19 NOTE — Progress Notes (Signed)
Pt was accepted to [facility] TODAY 01/19/2023; Bed Assignment 402-1  DX: bipolar depression; severe A/H.  Pt meets inpatient criteria per Lissa Hoard  Attending Physician will be Dr. Phineas Inches, MD   Report can be called to: -Adult unit: [phone]  Pt can arrive after: [facility] will coordinate care team.   Care Team notified: Night [facility] AC Kim Brooks,RN, Treana Forester, [name] [place], Shamiah [place], Kathryne Hitch [place], Night [facility] [place], Cindy Greene, Ene [place],NP, [place] Coker,LPN, [place], Tina Allen,FNP,Bethany Hendra,LCSW, [place], [place]vannah [place], PA-C, Shalon Bobbitt,NP, Afternoon [facility] AC Antoinette [place], Day [facility] AC Brook Wel[place], LCSWA 01/19/2023 @ 12:27 AM
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
## 2023-01-19 NOTE — ED Notes (Signed)
[facility] is here for transport.

## 2023-01-20 DIAGNOSIS — F3164 Bipolar disorder, current episode mixed, severe, with psychotic features: Secondary | ICD-10-CM | POA: Diagnosis not present

## 2023-01-20 LAB — LIPID PANEL
Cholesterol: 108 mg/dL (ref 0–200)
HDL: 33 mg/dL — ABNORMAL LOW (ref >40)
LDL Cholesterol: 57 mg/dL (ref 0–99)
Total CHOL/HDL Ratio: 3.3 RATIO
Triglycerides: 90 mg/dL (ref <150)
VLDL: 18 mg/dL (ref 0–40)

## 2023-01-20 LAB — BASIC METABOLIC PANEL
Anion gap: 9 (ref 5–15)
BUN: 9 mg/dL (ref 6–20)
CO2: 26 mmol/L (ref 22–32)
Calcium: 9.2 mg/dL (ref 8.9–10.3)
Chloride: 100 mmol/L (ref 98–111)
Creatinine, Ser: 0.73 mg/dL (ref 0.44–1.00)
GFR, Estimated: >60 mL/min (ref >60)
Glucose, Bld: 100 mg/dL — ABNORMAL HIGH (ref 70–99)
Potassium: 3.9 mmol/L (ref 3.5–5.1)
Sodium: 135 mmol/L (ref 135–145)

## 2023-01-20 LAB — URINALYSIS, ROUTINE W REFLEX MICROSCOPIC
Bilirubin Urine: NEGATIVE
Glucose, UA: NEGATIVE mg/dL
Ketones, ur: NEGATIVE mg/dL
Leukocytes,Ua: NEGATIVE
Nitrite: NEGATIVE
Protein, ur: NEGATIVE mg/dL
Specific Gravity, Urine: 1.009 (ref 1.005–1.030)
pH: 6 (ref 5.0–8.0)

## 2023-01-20 LAB — RAPID URINE DRUG SCREEN, HOSP PERFORMED
Amphetamines: NOT DETECTED
Barbiturates: NOT DETECTED
Benzodiazepines: NOT DETECTED
Cocaine: NOT DETECTED
Opiates: NOT DETECTED
Tetrahydrocannabinol: NOT DETECTED

## 2023-01-20 LAB — HEMOGLOBIN A1C
Hgb A1c MFr Bld: 5.8 % — ABNORMAL HIGH (ref 4.8–5.6)
Mean Plasma Glucose: 119.76 mg/dL

## 2023-01-20 LAB — VITAMIN D 25 HYDROXY (VIT D DEFICIENCY, FRACTURES): Vit D, 25-Hydroxy: 32.23 ng/mL (ref 30–100)

## 2023-01-20 MED ORDER — POLYETHYLENE GLYCOL 3350 17 G PO PACK
17.0000 g | PACK | Freq: Every day | ORAL | Status: DC
  Administered 2023-01-20 – 2023-01-22 (×3): 17 g via ORAL
  Filled 2023-01-20 (×6): qty 1

## 2023-01-20 MED ORDER — TRAZODONE HCL 100 MG PO TABS
100.0000 mg | ORAL_TABLET | Freq: Every day | ORAL | Status: DC
  Administered 2023-01-20: 100 mg via ORAL
  Filled 2023-01-20 (×3): qty 1

## 2023-01-20 MED ORDER — RISPERIDONE 1 MG PO TABS
1.0000 mg | ORAL_TABLET | Freq: Two times a day (BID) | ORAL | Status: DC
  Administered 2023-01-20 – 2023-01-21 (×2): 1 mg via ORAL
  Filled 2023-01-20 (×6): qty 1

## 2023-01-20 MED ORDER — HYDROXYZINE HCL 50 MG PO TABS
50.0000 mg | ORAL_TABLET | Freq: Every day | ORAL | Status: DC
  Administered 2023-01-20: 50 mg via ORAL
  Filled 2023-01-20 (×3): qty 1

## 2023-01-20 NOTE — Progress Notes (Signed)
   01/20/23 0602  15 Minute Checks  Location Bedroom  Visual Appearance Calm  Behavior Composed  Sleep (Behavioral Health Patients Only)  Calculate sleep? (Click Yes once per 24 hr at 0600 safety check) Yes  Documented sleep last 24 hours 7.5

## 2023-01-20 NOTE — [facility] Group Notes (Signed)
LCSW Group Therapy Note  01/20/2023   10:00-11:00am   Type of Therapy and Topic:  Group Therapy: Anger Cues and Responses  Participation Level:  Minimal   Description of Group:   In this group, patients learned how to recognize the physical, cognitive, emotional, and behavioral responses they have to anger-provoking situations.  They identified a recent time they became angry and how they reacted.  They analyzed how their reaction was possibly beneficial and how it was possibly unhelpful.  The group discussed a variety of healthier coping skills that could help with such a situation in the future.  They also learned that anger is a second emotion fueled by other feelings and explored their own emotions that may frequently fuel their anger.  Focus was placed on how helpful it is to recognize the underlying emotions to our anger, because working on those can lead to a more permanent solution as well as our ability to focus on the important rather than the urgent.  Therapeutic Goals: Patients will remember their last incident of anger and how they felt emotionally and physically, what their thoughts were at the time, and how they behaved. Patients will identify how their behavior at that time worked for them, as well as how it worked against them. Patients will explore possible new behaviors to use in future anger situations. Patients will learn that anger itself is normal and cannot be eliminated, and that healthier reactions can assist with resolving conflict rather than worsening situations. Patients will learn that anger is a secondary emotion and worked to identify some of the underlying feelings that may lead to anger.  Summary of Patient Progress:   Patient came into group late and did not engage in discussion but did appear to be actively listening.   Therapeutic Modalities:   Cognitive Behavioral Therapy  Ashley Cole

## 2023-01-20 NOTE — [facility] Group Notes (Signed)
[facility] Group Notes:  (Nursing/MHT/Case Management/Adjunct)  Date:  01/20/2023  Time:  10:33 PM  Type of Therapy:   Group Wrap up  Participation Level:  Active  Participation Quality:  Sharing and Supportive  Affect:  Appropriate  Cognitive:  Alert and Appropriate  Insight:  Appropriate and Good  Engagement in Group:  Engaged, Improving, and Supportive  Modes of Intervention:  Discussion, Socialization, and Support  Summary of Progress/Problems: Pt attended group   Ashley Cole 01/20/2023, 10:33 PM

## 2023-01-20 NOTE — [facility] Group Notes (Signed)
Pt did not attend goals group. 
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
## 2023-01-20 NOTE — Progress Notes (Signed)
   01/20/23 0740  Psych Admission Type (Psych Patients Only)  Admission Status Involuntary  Psychosocial Assessment  Patient Complaints Depression  Eye Contact Fair  Facial Expression Flat  Affect Flat  Speech Logical/coherent  Interaction Assertive  Motor Activity Other (Comment) (WDL)  Appearance/Hygiene Unremarkable  Behavior Characteristics Cooperative  Mood Depressed  Thought Process  Coherency WDL  Content WDL  Delusions None reported or observed  Perception WDL  Hallucination None reported or observed  Judgment Poor  Confusion None  Danger to Self  Current suicidal ideation? Denies  Agreement Not to Harm Self Yes  Description of Agreement verbal  Danger to Others  Danger to Others None reported or observed

## 2023-01-20 NOTE — Progress Notes (Signed)
Pt reports that the last couple of days she wasn't able to sleep, so today she was able to catch up on her sleep. She reports that her appetite is improving. She does endorse having AH of a voice telling her that they're going to harm her earlier during the day, but denies any AH tonight. Endorses paranoia due to her AH. Her goals for this admission is to "get on the right medications and get a therapist." Pt was reluctant to take her trazodone, but after this Clinical research associate provided pt education about the medication she took it. She did attend and participate in wrap-up group. Pt denies SI/HI and AVH. Active listening, reassurance, and support provided. Q 15 min safety checks continue. Pt's safety has been maintained.   01/19/23 2106  Psych Admission Type (Psych Patients Only)  Admission Status Involuntary  Psychosocial Assessment  Patient Complaints Depression;Worrying;Insomnia  Eye Contact Fair  Facial Expression Flat  Affect Flat;Depressed  Speech Logical/coherent  Interaction Assertive  Motor Activity Other (Comment) (WDL)  Appearance/Hygiene Unremarkable  Behavior Characteristics Cooperative;Calm;Anxious  Mood Depressed;Pleasant  Thought Process  Coherency WDL  Content Paranoia  Delusions Paranoid  Perception Hallucinations  Hallucination Auditory  Judgment Limited  Confusion None  Danger to Self  Current suicidal ideation? Denies  Agreement Not to Harm Self Yes  Description of Agreement verbally contracts for safety  Danger to Others  Danger to Others None reported or observed

## 2023-01-20 NOTE — Progress Notes (Signed)
Urine collected and placed in lab fridge for pickup

## 2023-01-21 ENCOUNTER — Encounter (HOSPITAL_COMMUNITY): Payer: Self-pay

## 2023-01-21 DIAGNOSIS — F3164 Bipolar disorder, current episode mixed, severe, with psychotic features: Secondary | ICD-10-CM | POA: Diagnosis not present

## 2023-01-21 MED ORDER — RISPERIDONE 2 MG PO TABS
2.0000 mg | ORAL_TABLET | Freq: Every day | ORAL | Status: DC
  Administered 2023-01-22 – 2023-01-23 (×2): 2 mg via ORAL
  Filled 2023-01-21 (×3): qty 1

## 2023-01-21 MED ORDER — HYDROXYZINE HCL 50 MG PO TABS
50.0000 mg | ORAL_TABLET | Freq: Three times a day (TID) | ORAL | Status: DC | PRN
  Administered 2023-01-23: 50 mg via ORAL
  Filled 2023-01-21: qty 1

## 2023-01-21 MED ORDER — RISPERIDONE 1 MG PO TABS
1.0000 mg | ORAL_TABLET | Freq: Every day | ORAL | Status: AC
  Administered 2023-01-21: 1 mg via ORAL
  Filled 2023-01-21: qty 1

## 2023-01-21 NOTE — Progress Notes (Signed)
   01/20/23 2130  Psych Admission Type (Psych Patients Only)  Admission Status Involuntary  Psychosocial Assessment  Patient Complaints Depression  Eye Contact Fair  Facial Expression Flat  Affect Depressed  Speech Logical/coherent  Interaction Assertive  Motor Activity Other (Comment) (WDL)  Appearance/Hygiene Unremarkable  Behavior Characteristics Cooperative;Appropriate to situation  Mood Depressed;Pleasant  Thought Process  Coherency WDL  Content WDL  Delusions None reported or observed  Perception WDL  Hallucination None reported or observed  Judgment Impaired  Confusion None  Danger to Self  Current suicidal ideation? Denies  Agreement Not to Harm Self Yes  Description of Agreement verbal  Danger to Others  Danger to Others None reported or observed

## 2023-01-21 NOTE — Progress Notes (Signed)
   01/21/23 0600  15 Minute Checks  Location Bedroom  Visual Appearance Calm  Behavior Sleeping  Sleep (Behavioral Health Patients Only)  Calculate sleep? (Click Yes once per 24 hr at 0600 safety check) Yes  Documented sleep last 24 hours 8

## 2023-01-21 NOTE — Group Note (Signed)
Recreation Therapy Group Note   Group Topic:Stress Management  Group Date: 01/21/2023 Start Time: 0930 End Time: 1000 Facilitators: Cheron Coryell-McCall, LRT,CTRS Location: [address] Dayroom   Goal Area(s) Addresses:  Patient will actively participate in stress management techniques presented during session.  Patient will successfully identify benefit of practicing stress management post d/c.   Group Description:  Guided Imagery. LRT provided education, instruction, and demonstration on practice of visualization via guided imagery. Patient was asked to participate in the technique introduced during session. LRT debriefed including topics of mindfulness, stress management and specific scenarios each patient could use these techniques. Patients were given suggestions of ways to access scripts post d/c and encouraged to explore Youtube and other apps available on smartphones, tablets, and computers.   Clinical Observations/Individualized Feedback: Unable to conduct group session due to maintenance being completed in dayroom.     Plan: Continue to engage patient in RT group sessions 2-3x/week.   Prajwal Fellner-McCall, LRT,CTRS 01/21/2023 12:09 PM

## 2023-01-21 NOTE — Progress Notes (Signed)
   01/21/23 0620  Vitals  Temp 98.5 F (36.9 C)  Temp Source Oral  BP (!) 88/47  MAP (mmHg) (!) 59  BP Method Automatic  Pulse Rate (!) 115  Pulse Rate Source Monitor  Oxygen Therapy  SpO2 100 %   Pt reported dizziness and change in vision. Provided pt with two cups of water. Rechecked vital signs. Vital signs within normal limits and pt reported improvement in dizziness.

## 2023-01-21 NOTE — Group Note (Signed)
Date:  01/21/2023 Time:  6:28 PM  Group Topic/Focus:  Goals Group:   The focus of this group is to help patients establish daily goals to achieve during treatment and discuss how the patient can incorporate goal setting into their daily lives to aide in recovery.    Participation Level:  Active  Participation Quality:  Appropriate  Affect:  Appropriate  Cognitive:  Appropriate  Insight: Appropriate  Engagement in Group:  Engaged  Modes of Intervention:  Discussion  Additional Comments:  Pt goal: To talk to the Child psychotherapist about making a plan.   Donell Beers 01/21/2023, 6:28 PM

## 2023-01-21 NOTE — Plan of Care (Signed)
  Problem: Education: Goal: Knowledge of [facility] Education information/materials will improve 01/21/2023 1734 by Melvenia Needles, RN Outcome: Progressing 01/21/2023 1734 by Melvenia Needles, RN Outcome: Progressing Goal: Emotional status will improve 01/21/2023 1734 by Melvenia Needles, RN Outcome: Progressing 01/21/2023 1734 by Melvenia Needles, RN Outcome: Progressing Goal: Mental status will improve 01/21/2023 1734 by Melvenia Needles, RN Outcome: Progressing 01/21/2023 1734 by Melvenia Needles, RN Outcome: Progressing Goal: Verbalization of understanding the information provided will improve 01/21/2023 1734 by Melvenia Needles, RN Outcome: Progressing 01/21/2023 1734 by Melvenia Needles, RN Outcome: Progressing   Problem: Education: Goal: Mental status will improve 01/21/2023 1734 by Melvenia Needles, RN Outcome: Progressing 01/21/2023 1734 by Melvenia Needles, RN Outcome: Progressing

## 2023-01-21 NOTE — Progress Notes (Signed)
   01/21/23 0800  Psych Admission Type (Psych Patients Only)  Admission Status Involuntary  Psychosocial Assessment  Patient Complaints Depression;Crying spells;Irritability  Eye Contact Fair  Facial Expression Flat  Affect Depressed  Speech Logical/coherent  Interaction Assertive;Other (Comment) (WDL)  Motor Activity Other (Comment) (WDL)  Appearance/Hygiene Unremarkable  Behavior Characteristics Cooperative;Appropriate to situation  Mood Depressed;Pleasant  Thought Process  Coherency WDL  Content WDL  Delusions None reported or observed  Perception WDL  Hallucination None reported or observed  Judgment Impaired  Confusion None  Danger to Self  Current suicidal ideation? Denies  Description of Suicide Plan n/a  Agreement Not to Harm Self Yes  Description of Agreement verbal  Danger to Others  Danger to Others None reported or observed

## 2023-01-21 NOTE — BH IP Treatment Plan (Signed)
Interdisciplinary Treatment and Diagnostic Plan Update  01/21/2023 Time of Session: 1030 STEPANIE MASSIAH MRN: [id number]  Principal Diagnosis: Bipolar disorder, curr episode mixed, severe, with psychotic features (HCC)  Secondary Diagnoses: Principal Problem:   Bipolar disorder, curr episode mixed, severe, with psychotic features (HCC) Active Problems:   GAD (generalized anxiety disorder)   Insomnia   Current Medications:  Current Facility-Administered Medications  Medication Dose Route Frequency Provider Last Rate Last Admin   acetaminophen (TYLENOL) tablet 650 mg  650 mg Oral Q6H PRN Ajibola, Ene A, NP       alum & mag hydroxide-simeth (MAALOX/MYLANTA) 200-200-20 MG/5ML suspension 30 mL  30 mL Oral Q4H PRN Ajibola, Ene A, NP       diphenhydrAMINE (BENADRYL) capsule 50 mg  50 mg Oral TID PRN Ajibola, Ene A, NP       Or   diphenhydrAMINE (BENADRYL) injection 50 mg  50 mg Intramuscular TID PRN Ajibola, Ene A, NP       haloperidol (HALDOL) tablet 5 mg  5 mg Oral TID PRN Ajibola, Ene A, NP       Or   haloperidol lactate (HALDOL) injection 5 mg  5 mg Intramuscular TID PRN Ajibola, Ene A, NP       hydrOXYzine (ATARAX) tablet 25 mg  25 mg Oral TID PRN Ajibola, Ene A, NP       hydrOXYzine (ATARAX) tablet 50 mg  50 mg Oral QHS Nkwenti, Doris, NP   50 mg at 01/20/23 2108   LORazepam (ATIVAN) tablet 2 mg  2 mg Oral TID PRN Ajibola, Ene A, NP       Or   LORazepam (ATIVAN) injection 2 mg  2 mg Intramuscular TID PRN Ajibola, Ene A, NP       magnesium hydroxide (MILK OF MAGNESIA) suspension 30 mL  30 mL Oral Daily PRN Ajibola, Ene A, NP       polyethylene glycol (MIRALAX / GLYCOLAX) packet 17 g  17 g Oral Daily Nkwenti, Doris, NP   17 g at 01/21/23 8657   risperiDONE (RISPERDAL) tablet 1 mg  1 mg Oral BID Starleen Blue, NP   1 mg at 01/21/23 8469   traZODone (DESYREL) tablet 100 mg  100 mg Oral QHS Nkwenti, Doris, NP   100 mg at 01/20/23 2108   PTA Medications: Medications Prior to Admission   Medication Sig Dispense Refill Last Dose   escitalopram (LEXAPRO) 10 MG tablet Take 10 mg by mouth daily. (Patient not taking: Reported on 01/18/2023)       Patient Stressors:    Patient Strengths:    Treatment Modalities: Medication Management, Group therapy, Case management,  1 to 1 session with clinician, Psychoeducation, Recreational therapy.   Physician Treatment Plan for Primary Diagnosis: Bipolar disorder, curr episode mixed, severe, with psychotic features (HCC) Long Term Goal(s): Improvement in symptoms so as ready for discharge   Short Term Goals: Ability to identify changes in lifestyle to reduce recurrence of condition will improve Ability to verbalize feelings will improve Ability to disclose and discuss suicidal ideas Ability to demonstrate self-control will improve Ability to identify and develop effective coping behaviors will improve Ability to maintain clinical measurements within normal limits will improve Compliance with prescribed medications will improve  Medication Management: Evaluate patient's response, side effects, and tolerance of medication regimen.  Therapeutic Interventions: 1 to 1 sessions, Unit Group sessions and Medication administration.  Evaluation of Outcomes: Progressing  Physician Treatment Plan for Secondary Diagnosis: Principal Problem:   Bipolar disorder,  curr episode mixed, severe, with psychotic features (HCC) Active Problems:   GAD (generalized anxiety disorder)   Insomnia  Long Term Goal(s): Improvement in symptoms so as ready for discharge   Short Term Goals: Ability to identify changes in lifestyle to reduce recurrence of condition will improve Ability to verbalize feelings will improve Ability to disclose and discuss suicidal ideas Ability to demonstrate self-control will improve Ability to identify and develop effective coping behaviors will improve Ability to maintain clinical measurements within normal limits will  improve Compliance with prescribed medications will improve     Medication Management: Evaluate patient's response, side effects, and tolerance of medication regimen.  Therapeutic Interventions: 1 to 1 sessions, Unit Group sessions and Medication administration.  Evaluation of Outcomes: Progressing   RN Treatment Plan for Primary Diagnosis: Bipolar disorder, curr episode mixed, severe, with psychotic features (HCC) Long Term Goal(s): Knowledge of disease and therapeutic regimen to maintain health will improve  Short Term Goals: Ability to remain free from injury will improve, Ability to verbalize frustration and anger appropriately will improve, Ability to demonstrate self-control, Ability to participate in decision making will improve, Ability to verbalize feelings will improve, Ability to disclose and discuss suicidal ideas, Ability to identify and develop effective coping behaviors will improve, and Compliance with prescribed medications will improve  Medication Management: RN will administer medications as ordered by provider, will assess and evaluate patient's response and provide education to patient for prescribed medication. RN will report any adverse and/or side effects to prescribing provider.  Therapeutic Interventions: 1 on 1 counseling sessions, Psychoeducation, Medication administration, Evaluate responses to treatment, Monitor vital signs and CBGs as ordered, Perform/monitor CIWA, COWS, AIMS and Fall Risk screenings as ordered, Perform wound care treatments as ordered.  Evaluation of Outcomes: Progressing   LCSW Treatment Plan for Primary Diagnosis: Bipolar disorder, curr episode mixed, severe, with psychotic features (HCC) Long Term Goal(s): Safe transition to appropriate next level of care at discharge, Engage patient in therapeutic group addressing interpersonal concerns.  Short Term Goals: Engage patient in aftercare planning with referrals and resources, Increase social  support, Increase ability to appropriately verbalize feelings, Increase emotional regulation, Facilitate acceptance of mental health diagnosis and concerns, Facilitate patient progression through stages of change regarding substance use diagnoses and concerns, Identify triggers associated with mental health/substance abuse issues, and Increase skills for wellness and recovery  Therapeutic Interventions: Assess for all discharge needs, 1 to 1 time with Social worker, Explore available resources and support systems, Assess for adequacy in community support network, Educate family and significant other(s) on suicide prevention, Complete Psychosocial Assessment, Interpersonal group therapy.  Evaluation of Outcomes: Progressing   Progress in Treatment: Attending groups: Yes. Participating in groups: Yes. Taking medication as prescribed: Yes. Toleration medication: Yes. Family/Significant other contact made: No, will contact:  declined consents Patient understands diagnosis: Yes. Discussing patient identified problems/goals with staff: Yes. Medical problems stabilized or resolved: Yes. Denies suicidal/homicidal ideation: Yes. Issues/concerns per patient self-inventory: No. Other:  New problem(s) identified: No, Describe:  none reported  New Short Term/Long Term Goal(s):medication management for mood stabilization;  development of comprehensive mental wellness/sobriety plan  Patient Goals:  grief therapy, medication management  Discharge Plan or Barriers: Patient recently admitted. CSW will continue to follow and assess for appropriate referrals and possible discharge planning.  Reason for Continuation of Hospitalization: Depression Medication stabilization  Estimated Length of Stay:3-5 days  Last 3 [place] Suicide Severity Risk Score: Flowsheet Row [facility] (Current[facility]  ED from [facility]18/2023 in [facility] ED from [facility]17/2023 in [facility]  C-SSRS RISK CATEGORY No Risk Low Risk No Risk       Last [facility] 2/9 Scores:    01/14/2023   11:33 AM 10/24/2022   10:00 AM 04/11/2022    2:17 PM  Depression screen PHQ 2/9  Decreased Interest 0 0 0  Down, Depressed, Hopeless 0 0 0  PHQ - 2 Score 0 0 0  Altered sleeping 0 0 0  Tired, decreased energy 0 0 0  Change in appetite 0 0 0  Feeling bad or failure about yourself  0 0 0  Trouble concentrating 0 0 0  Moving slowly or fidgety/restless 0 0 0  Suicidal thoughts 0 0 0  PHQ-9 Score 0 0 0  Difficult doing work/chores Not difficult at all Not difficult at all     Scribe for Treatment Team: Starleen Arms, [place] 01/21/2023 1:49 PM

## 2023-01-21 NOTE — [facility] Group Notes (Signed)
Adult Psychoeducational Group Note  Date:  01/21/2023 Time:  9:04 PM  Group Topic/Focus:  Wrap-Up Group:   The focus of this group is to help patients review their daily goal of treatment and discuss progress on daily workbooks.  Participation Level:  Active  Participation Quality:  Appropriate  Affect:  Appropriate  Cognitive:  Appropriate  Insight: Appropriate  Engagement in Group:  Engaged  Modes of Intervention:  Discussion and Support  Additional Comments:  Pt told that today was a good day on the unit, the highlight of which was going to the courtyard for fresh air. On the subject of goals for the week, Pt mentioned wanting to discharge as soon as possible. "I think I just need to see a therapist on the outside." Pt rated her day a 5 out of 10.  Christ Kick 01/21/2023, 9:04 PM

## 2023-01-21 NOTE — Progress Notes (Addendum)
[facility] MD Progress Note  01/21/2023 3:17 PM [name]  MRN:  [id number] Principal Problem: Bipolar disorder, curr episode mixed, severe, with psychotic features (HCC) Diagnosis: Principal Problem:   Bipolar disorder, curr episode mixed, severe, with psychotic features (HCC) Active Problems:   GAD (generalized anxiety disorder)   Insomnia  Reason for Admission: Ashley Cole is a 37 year old African-American female with prior mental health history of MDD and GAD who presented to the [facility] urgent care [facility]) on 8/16 with complaints of worsening psychosis; reported +AH & lack of sleep for a week prior to presentation to [facility]. Exhibiting behaviors at the [facility] such as "screaming, shaking, crying and jumping up and down", and stating "help me". Mother also reported that pt had called the Sheriff's dept stating that she felt unsafe at home. Pt was transferred to this [facility] via IVC for treatment and stabilization of her mental status.   24 hr chart review: Sleep Hours last night were 8 hrs as per nursing documentation.  Nursing Concerns:  Behavioral episodes in the past 24 hrs: Paranoia Medication Compliance: compliant  Vital Signs in the past 24 hrs: BP low earlier today  morning at 88/47. Fluids being encouraged. HR 115. Rechecked vitals were better (see flow sheets).  Will ask nursing to recheck vitals PRN Medications in the past 24 hrs: none   Patient assessment note:  On assessment today, the pt reports that their mood is depressed, but improving. Reports that anxiety is moderate. Sleep is poor, but better than night prior. Pt  reports that she had trouble staying asleep, and had frequent periods of wakefulness through entire night. Appetite is fair, and improving. Concentration is fair  Energy level is low Denies suicidal thoughts. Denies suicidal intent and plan.  Denies having any HI.  Denies having psychotic symptoms. States that the last time that she had AH and heard voices was on Saturday night, which is 1.5 days ago. States that paranoia is resolving and she no longer feels like people are out to harm her, and no longer feels like people know what she is thinking.   Reports tiredness related to current medication regimen, states that she prefers for medication to be given at night. Reports not liking the Trazodone, reports lightheadedness from it, and also states that it is causing her to have blurry vision, wants this medication discontinued. States she wants the nightly Hydroxyzine to be changed to PRN. Patient also requesting for the Risperdal to be combined to 2 mg given nightly to avoid day time grogginess.  We discussed changes to current medication regimen, including implementing the changes per her request as above.   Patient inquired about getting discharged soon, educated on the fact that symptoms will continue to be assessed on a daily basis and we will make a determination to discharge her when symptoms have resolved to the point where she can be managed on an outpatient basis. Pt also educated that it is important for medication side effects to be resolved, and that outpatient appointments will have to be completed for med management and  therapy. Tentative discharge date is  01/24/23.          Total Time spent with patient: 45 minutes  Past Psychiatric History: See H & P  Past Medical History:  Past Medical History:  Diagnosis Date   Abnormal Pap smear    Anemia    Anxiety    Chlamydia    COVID-19    GERD (gastroesophageal reflux disease)    Headache(784.0)    Human papilloma virus    cells removed x2   Spinal headache    Trichomonas    Urinary tract infection    Vaginal Pap smear, abnormal     Past Surgical History:  Procedure Laterality Date   DILATION AND CURETTAGE OF UTERUS     GYNECOLOGIC CRYOSURGERY     PILONIDAL CYST EXCISION     THERAPEUTIC ABORTION     Family History:  Family History  Problem Relation Age of Onset   Hypertension Mother    Fibroids Mother    Cancer Mother        ovarian   Cancer Maternal Grandfather        cervical   Thyroid disease Brother    Family Psychiatric  History: See H & P Social History:  Social History   Substance and Sexual Activity  Alcohol Use Yes   Comment:  socially     Social History   Substance and Sexual Activity  Drug Use  No    Social History   Socioeconomic History   Marital status: Legally Separated    Spouse name: Not on file   Number of children: Not on file   Years of education: Not on file   Highest education level: Not on file  Occupational History   Not on file  Tobacco Use   Smoking status: Every Day    Types: Cigarettes   Smokeless tobacco: Never  Vaping Use   Vaping status: Never Used  Substance and Sexual Activity   Alcohol use: Yes    Comment:  socially   Drug use: No   Sexual activity: Yes    Birth control/protection: None    Comment: husband is sterile  Other Topics Concern   Not on file  Social History Narrative   Not on file   Social Determinants of Health   Financial Resource Strain: Not on file  Food Insecurity: No Food Insecurity (01/19/2023)   Hunger Vital Sign    Worried About Running Out of Food in  the Last Year: Never true    Ran Out of Food in the Last Year: Never true  Transportation Needs: No Transportation Needs (01/19/2023)   PRAPARE - Administrator, Civil Service (Medical): No    Lack of Transportation (Non-Medical): No  Physical Activity: Not on file  Stress: Not on file  Social Connections: Not on file   Sleep: Poor  Appetite:  Fair  Current Medications: Current Facility-Administered Medications  Medication Dose Route Frequency Provider Last Rate Last Admin   acetaminophen (TYLENOL) tablet 650 mg  650 mg Oral Q6H PRN Ajibola, Ene A, NP       alum & mag hydroxide-simeth (MAALOX/MYLANTA) 200-200-20 MG/5ML suspension 30 mL  30 mL Oral Q4H PRN Ajibola, Ene A, NP       diphenhydrAMINE (BENADRYL) capsule 50 mg  50 mg Oral TID PRN Ajibola, Ene A, NP       Or   diphenhydrAMINE (BENADRYL) injection 50 mg  50 mg Intramuscular TID PRN Ajibola, Ene A, NP       haloperidol (HALDOL) tablet 5 mg  5 mg Oral TID PRN Ajibola, Ene A, NP       Or   haloperidol lactate (HALDOL) injection 5 mg  5 mg Intramuscular TID PRN Ajibola, Ene A, NP       hydrOXYzine (ATARAX) tablet 50 mg  50 mg Oral TID PRN Starleen Blue, NP       LORazepam (ATIVAN) tablet 2 mg  2 mg Oral TID PRN Ajibola, Ene A, NP       Or   LORazepam (ATIVAN) injection 2 mg  2 mg Intramuscular TID PRN Ajibola, Ene A, NP       magnesium hydroxide (MILK OF MAGNESIA) suspension 30 mL  30 mL Oral Daily PRN Ajibola, Ene A, NP       polyethylene glycol (MIRALAX / GLYCOLAX) packet 17 g  17 g Oral Daily Jesiel Garate, NP   17 g at 01/21/23 2956   risperiDONE (RISPERDAL) tablet 1 mg  1 mg Oral QHS Dennis Hegeman, NP       [START ON 01/22/2023] risperiDONE (RISPERDAL) tablet 2 mg  2 mg Oral QHS Xerxes Agrusa, NP        Lab Results:  Results for orders placed or performed during the hospital encounter of 01/19/23 (from the past 48 hour(s))  Urinalysis, Routine w reflex microscopic -Urine, Clean Catch     Status: Abnormal    Collection  Time: 01/20/23  2:01 PM  Result Value Ref Range   Color, Urine YELLOW YELLOW   APPearance HAZY (A) CLEAR   Specific Gravity, Urine 1.009 1.005 - 1.030   pH 6.0 5.0 - 8.0   Glucose, UA NEGATIVE NEGATIVE mg/dL   Hgb urine dipstick MODERATE (A) NEGATIVE   Bilirubin Urine NEGATIVE NEGATIVE   Ketones, ur NEGATIVE NEGATIVE mg/dL   Protein, ur NEGATIVE NEGATIVE mg/dL   Nitrite NEGATIVE NEGATIVE   Leukocytes,Ua NEGATIVE NEGATIVE   RBC / HPF 6-10 0 - 5 RBC/hpf   WBC, UA 0-5 0 - 5 WBC/hpf   Bacteria, UA RARE (A) NONE SEEN   Squamous Epithelial / HPF 6-10 0 - 5 /HPF   Mucus PRESENT     Comment: Performed at [facility], [address] [place], [place] [address]  Rapid urine drug screen (hospital performed)     Status: None   Collection Time: 01/20/23  2:01 PM  Result Value Ref Range   Opiates NONE DETECTED NONE DETECTED   Cocaine NONE DETECTED NONE DETECTED   Benzodiazepines NONE DETECTED NONE DETECTED   Amphetamines NONE DETECTED NONE DETECTED   Tetrahydrocannabinol NONE DETECTED NONE DETECTED   Barbiturates NONE DETECTED NONE DETECTED    Comment: (NOTE) DRUG SCREEN FOR MEDICAL PURPOSES ONLY.  IF CONFIRMATION IS NEEDED FOR ANY PURPOSE, NOTIFY LAB WITHIN 5 DAYS.  LOWEST DETECTABLE LIMITS FOR URINE DRUG SCREEN Drug Class                     Cutoff (ng/mL) Amphetamine and metabolites    1000 Barbiturate and metabolites    200 Benzodiazepine                 200 Opiates and metabolites        300 Cocaine and metabolites        300 THC                            50 Performed at [facility], [address], [place], [place] [address]   Hemoglobin A1c     Status: Abnormal   Collection Time: 01/20/23  6:34 PM  Result Value Ref Range   Hgb A1c MFr Bld 5.8 (H) 4.8 - 5.6 %    Comment: (NOTE) Pre diabetes:          5.7%-6.4%  Diabetes:              >6.4%  Glycemic control for   <7.0% adults with diabetes    Mean Plasma Glucose  119.76 mg/dL    Comment: Performed at [facility], [address], [place], [place] [address]  Lipid panel     Status: Abnormal   Collection Time: 01/20/23  6:34 PM  Result Value Ref Range   Cholesterol 108 0 - 200 mg/dL   Triglycerides 90 <272 mg/dL   HDL 33 (L) >53 mg/dL   Total CHOL/HDL Ratio 3.3 RATIO   VLDL 18 0 - 40 mg/dL   LDL Cholesterol 57 0 - 99 mg/dL    Comment:        Total Cholesterol/HDL:CHD Risk Coronary Heart Disease Risk Table                     Men   Women  1/2 Average Risk   3.4   3.3  Average Risk       5.0   4.4  2 X Average Risk   9.6   7.1  3 X Average Risk  23.4   11.0        Use the calculated Patient Ratio above and the CHD Risk Table to determine the patient's CHD Risk.        ATP III CLASSIFICATION (LDL):  <100     mg/dL   Optimal  025-427  mg/dL   Near or Above                    Optimal  130-159  mg/dL   Borderline  062-376  mg/dL   High  >283     mg/dL   Very High Performed at [facility], [address], [place], [place] [address]   Basic metabolic panel     Status: Abnormal   Collection Time: 01/20/23  6:34 PM  Result Value Ref Range   Sodium 135 135 - 145 mmol/L   Potassium 3.9 3.5 - 5.1 mmol/L   Chloride 100 98 - 111 mmol/L   CO2 26 22 - 32 mmol/L   Glucose, Bld 100 (H) 70 - 99 mg/dL    Comment: Glucose reference range applies only to samples taken after fasting for at least 8 hours.   BUN 9 6 - 20 mg/dL   Creatinine, Ser 1.60 0.44 - 1.00 mg/dL   Calcium 9.2 8.9 - 73.7 mg/dL   GFR, Estimated >10 >62 mL/min    Comment: (NOTE) Calculated using the CKD-EPI Creatinine Equation (2021)    Anion gap 9 5 - 15    Comment: Performed at [facility], [address], [place], [place] [address]  VITAMIN D 25 Hydroxy (Vit-D Deficiency, Fractures)     Status: None   Collection Time: 01/20/23  6:34 PM  Result Value Ref Range   Vit D, 25-Hydroxy 32.23 30 - 100 ng/mL    Comment: (NOTE) Vitamin D  deficiency has been defined by the Institute of Medicine  and an Endocrine Society practice guideline as a level of serum 25-OH  vitamin D less than 20 ng/mL (1,2). The Endocrine Society went on to  further define vitamin D insufficiency as a level between 21 and 29  ng/mL (2).  1. IOM (Institute of Medicine). 2010. Dietary reference intakes for  calcium and D. [place]: [employer]. 2. Holick MF, Binkley Dundee, Bischoff-Ferrari HA, et al. Evaluation,  treatment, and prevention of vitamin D deficiency: an Endocrine  Society clinical practice guideline, JCEM. 2011 Jul; 96(7): 1911-30.  Performed at [facility], [address], [place], [place] [address]     Blood Alcohol level:  Lab Results  Component Value Date   [facility] <10 01/18/2023   ETH <10 01/18/2023    Metabolic Disorder Labs: Lab Results  Component Value Date   HGBA1C 5.8 (H) 01/20/2023   MPG 119.76 01/20/2023   No results found for: "PROLACTIN" Lab Results  Component Value Date   CHOL 108 01/20/2023   TRIG 90 01/20/2023   HDL 33 (L) 01/20/2023   CHOLHDL 3.3 01/20/2023   VLDL 18 01/20/2023   LDLCALC 57 01/20/2023   LDLCALC 89 05/03/2016    Physical Findings: AIMS:  , ,  ,  ,    CIWA:    COWS:     Musculoskeletal: Strength & Muscle Tone: within normal limits Gait & Station: normal Patient leans: N/A  Psychiatric Specialty Exam:  Presentation  General Appearance:  Appropriate for Environment; Fairly Groomed  Eye Contact: Fair  Speech: Clear and Coherent  Speech Volume: Normal  Handedness: Right   Mood and Affect  Mood: Depressed; Anxious  Affect: Congruent   Thought Process  Thought Processes: Coherent  Descriptions of Associations:Intact  Orientation:Full (Time, Place and Person)  Thought Content:Logical  History of Schizophrenia/Schizoaffective disorder:No  Duration of Psychotic Symptoms:Less than six months  Hallucinations:Hallucinations:  None Description of Auditory Hallucinations: voices threatening to kill her  Ideas of Reference:None  Suicidal Thoughts:Suicidal Thoughts: No  Homicidal Thoughts:Homicidal Thoughts: No  Sensorium  Memory: Immediate Good  Judgment: Fair  Insight: Fair  Art therapist  Concentration: Fair  Attention Span: Fair  Recall: [employer] of Knowledge: Fair  Language: Fair  Psychomotor Activity  Psychomotor Activity: Psychomotor Activity: Normal  Assets  Assets: Communication Skills; Social Support  Sleep  Sleep: Sleep: Poor  Physical Exam: Physical Exam Constitutional:      Appearance: Normal appearance.  HENT:     Head: Normocephalic.     Nose: Nose normal.  Eyes:     Pupils: Pupils are equal, round, and reactive to light.  Pulmonary:     Effort: Pulmonary effort is normal.  Musculoskeletal:     Cervical back: Normal range of motion.  Neurological:     Mental Status: She is alert and oriented to person, place, and time.    Review of Systems  Constitutional: Negative.   HENT: Negative.    Eyes: Negative.   Respiratory: Negative.    Cardiovascular: Negative.   Gastrointestinal:  Negative for heartburn.  Genitourinary:  Negative for dysuria.  Musculoskeletal: Negative.   Skin: Negative.   Neurological:  Negative for dizziness.  Psychiatric/Behavioral:  Positive for depression and substance abuse. Negative for hallucinations, memory loss and suicidal ideas. The patient is nervous/anxious and has insomnia.    Blood pressure 125/79, pulse 89, temperature 98.5 F (36.9 C), temperature source Oral, resp. rate 12, height 5' (1.524 m), weight 59.9 kg, last menstrual period 01/14/2023, SpO2 100%. Body mass index is 25.78 kg/m.  Treatment Plan Summary: Daily contact with patient to assess and evaluate symptoms and progress in treatment and Medication management   Safety and Monitoring: Voluntary admission to inpatient psychiatric unit for safety,  stabilization and treatment Daily contact with patient to assess and evaluate symptoms and progress in treatment Patient's case to be discussed in multi-disciplinary team meeting Observation Level : q15 minute checks Vital signs: q12 hours Precautions: Safety   Long Term Goal(s): Improvement in symptoms so as ready for discharge   Short Term Goals: Ability to identify changes in lifestyle to reduce recurrence of condition will improve, Ability to verbalize feelings will improve, Ability to disclose and discuss suicidal ideas, Ability to demonstrate self-control will improve, Ability to identify and develop effective coping behaviors will improve, Ability to maintain clinical measurements within normal limits will improve, and Compliance with prescribed medications will improve   Diagnoses Principal Problem:   Bipolar disorder, curr episode mixed, severe, with psychotic features (HCC) Active Problems:   GAD (generalized anxiety disorder)   Insomnia   Medications -Discontinued Abilify 5 mg after 2 doses on 8/18 as intense nature of psychosis required more potent antipsychotic med -Continue to hold Lexapro due to risk of triggering mania -Discontinue trazodone 100 mg nightly due to c/o dizziness & med not helping with sleep -Change Hydroxyzine 50 mg from at bedtime to PRN nightly  -Change Risperdal to 2 mg nightly for psychosis starting 8/20 -Continue Miralax daily for constipation    PRNS -Discontinue hydroxyzine  25 mg 3 times daily as needed for anxiety -Continue agitation protocol: Ativan/Benadryl/Haldol 3 times daily as needed for agitation as per the MAR -Continue Tylenol 650 mg every 6 hours PRN for mild pain -Continue Maalox 30 mg every 4 hrs PRN for indigestion -Continue Milk of Magnesia as needed every 6 hrs for constipation   ANTIPSYCHOTIC CONSENT : We discussed the risks, benefits, side effects, and alternatives to THE PRESCRIBED ANTIPSYCHOTIC, including but not limited to,  the risk of fatigue, sedation, metabolic syndrome, weight gain, movement abnormalities such as tremor & cogwheeling & tardive dyskinesia, temperature sensitivity, photosensitivity, blood pressure changes, heart rhythm effects, potential for medication interactions, and to not take these medications with alcohol or illicit drugs; informed consent was obtained. We discussed the necessity for routine monitoring including rating scales of abnormal movements, blood work, and ekgs, while the patient is prescribed antipsychotic medication.    Labs reviewed: Potassium level now WNL, other labs reviewed.   Discharge Planning: Social work and case management to assist with discharge planning and identification of hospital follow-up needs prior to discharge Estimated LOS: 5-7 days Discharge Concerns: Need to establish a safety plan; Medication compliance and effectiveness Discharge Goals: Return home with outpatient referrals for mental health follow-up including medication management/psychotherapy   I certify that inpatient services furnished can reasonably be expected to improve the patient's condition.    Starleen Blue, NP 01/21/2023, 3:17 PM Patient ID: Ashley Cole, female   DOB: 20-Sep-1985, 37 y.o.   MRN: [id number]

## 2023-01-21 NOTE — Plan of Care (Signed)
  Problem: Activity: Goal: Interest or engagement in activities will improve Outcome: Progressing   Problem: Coping: Goal: Ability to verbalize frustrations and anger appropriately will improve Outcome: Progressing   Problem: Safety: Goal: Periods of time without injury will increase Outcome: Progressing   

## 2023-01-22 DIAGNOSIS — F3164 Bipolar disorder, current episode mixed, severe, with psychotic features: Secondary | ICD-10-CM | POA: Diagnosis not present

## 2023-01-22 MED ORDER — ALBUTEROL SULFATE HFA 108 (90 BASE) MCG/ACT IN AERS: 2.0000 | INHALATION_SPRAY | RESPIRATORY_TRACT | Status: DC | PRN

## 2023-01-22 MED ORDER — MAGNESIUM CITRATE PO SOLN
1.0000 | Freq: Once | ORAL | Status: AC
  Administered 2023-01-22: 1 via ORAL
  Filled 2023-01-22 (×2): qty 296

## 2023-01-22 MED ORDER — LORATADINE 10 MG PO TABS
10.0000 mg | ORAL_TABLET | Freq: Every day | ORAL | Status: DC
  Administered 2023-01-22 – 2023-01-24 (×3): 10 mg via ORAL
  Filled 2023-01-22 (×4): qty 1

## 2023-01-22 NOTE — [facility] Group Notes (Signed)
[facility] Group Notes:  (Nursing/MHT/Case Management/Adjunct)  Date:  01/22/2023  Time:  10:06 PM  Type of Therapy:  Psychoeducational Skills  Participation Level:  Active  Participation Quality:  Appropriate  Affect:  Appropriate  Cognitive:  Appropriate  Insight:  Good  Engagement in Group:  Engaged  Modes of Intervention:  Support  Summary of Progress/Problems: The patient rated her day as an 8 out of 10 since she states that she did not see her doctor or Child psychotherapist. Her goal for tomorrow is to get discharged. She did state that she enjoyed going outside for fresh air.   Shawnita Krizek S 01/22/2023, 10:06 PM

## 2023-01-22 NOTE — Progress Notes (Signed)
[facility] MD Progress Note  01/22/2023 3:25 PM Ashley Cole  MRN:  [id number] Principal Problem: Bipolar disorder, curr episode mixed, severe, with psychotic features (HCC) Diagnosis: Principal Problem:   Bipolar disorder, curr episode mixed, severe, with psychotic features (HCC) Active Problems:   GAD (generalized anxiety disorder)   Insomnia  Reason for Admission: Ashley Cole is a 37 year old African-American female with prior mental health history of MDD and GAD who presented to the [facility] urgent care center [facility]) on 8/16 with complaints of worsening psychosis; reported +AH & lack of sleep for a week prior to presentation to [facility]. Exhibiting behaviors at the [facility] such as "screaming, shaking, crying and jumping up and down", and stating "help me". Mother also reported that pt had called the Sheriff's dept stating that she felt unsafe at home. Pt was transferred to this [facility] via IVC for treatment and stabilization of her mental status.   24 hr chart review: Sleep Hours last night were 8 hrs as per nursing documentation.  Nursing Concerns: none Behavioral episodes in the past 24 hrs: Paranoia Medication Compliance: compliant  Vital Signs in the past 24 hrs: WNL PRN Medications in  the past 24 hrs: none   Patient assessment note:  On assessment today, the pt reports that their mood has improved moderately since hospitalization. Reports that anxiety is lower than yesterday and continuing to resolve. Sleep last night was good.  Appetite is fair, and improving. Concentration is fair  Energy level is moderate, which is an improvement from yesterday. Denies suicidal thoughts. Denies suicidal intent and plan.  Denies having any HI.  Denies having psychotic symptoms; continues to state that last Saturday night was the last time that she had psychosis.  Denies medication related side effects. No TD/EPS type symptoms found on assessment, and pt denies any feelings of stiffness. AIMS: 0.  We are adding a one-time dose of mag citrate for constipation, and continuing other medications as listed below.  We talked about last night being the first night that patient had some good sleep, and the fact that we need to see good sleep for at least 3 days prior to discharge. Appointments will also have to be completed for med management and therapy. Tentative discharge date remains  01/24/23.          Total Time spent with patient: 45 minutes  Past Psychiatric History: See H & P  Past Medical History:  Past Medical History:  Diagnosis Date   Abnormal Pap smear    Anemia    Anxiety    Chlamydia    COVID-19    GERD (gastroesophageal reflux disease)    Headache(784.0)    Human papilloma virus    cells removed x2   Spinal headache    Trichomonas    Urinary tract infection    Vaginal Pap smear, abnormal     Past Surgical History:  Procedure Laterality Date   DILATION AND CURETTAGE OF UTERUS     GYNECOLOGIC CRYOSURGERY     PILONIDAL CYST EXCISION     THERAPEUTIC ABORTION     Family History:  Family History  Problem Relation Age of Onset   Hypertension Mother    Fibroids Mother    Cancer Mother        ovarian   Cancer Maternal Grandfather        cervical   Thyroid disease  Brother    Family Psychiatric  History: See H & P Social History:  Social History   Substance and Sexual Activity  Alcohol Use Yes   Comment:  socially     Social History   Substance and Sexual Activity  Drug Use No    Social History   Socioeconomic History   Marital status: Legally Separated    Spouse name: Not on file   Number of children: Not on file   Years of education: Not on file   Highest education level: Not on file  Occupational History   Not on file  Tobacco Use   Smoking status: Every Day    Types: Cigarettes   Smokeless tobacco: Never  Vaping Use   Vaping status: Never Used  Substance and Sexual Activity   Alcohol use: Yes    Comment:  socially   Drug use: No   Sexual activity: Yes    Birth control/protection: None    Comment: husband is sterile  Other Topics Concern   Not on file  Social History Narrative   Not on file   Social Determinants of Health  Financial Resource Strain: Not on file  Food Insecurity: No Food Insecurity (01/19/2023)   Hunger Vital Sign    Worried About Running Out of Food in the Last Year: Never true    Ran Out of Food in the Last Year: Never true  Transportation Needs: No Transportation Needs (01/19/2023)   PRAPARE - Administrator, Civil Service (Medical): No    Lack of Transportation (Non-Medical): No  Physical Activity: Not on file  Stress: Not on file  Social Connections: Not on file   Sleep: Poor  Appetite:  Fair  Current Medications: Current Facility-Administered Medications  Medication Dose Route Frequency Provider Last Rate Last Admin   acetaminophen (TYLENOL) tablet 650 mg  650 mg Oral Q6H PRN Ajibola, Ene A, NP       albuterol (VENTOLIN HFA) 108 (90 Base) MCG/ACT inhaler 2 puff  2 puff Inhalation Q4H PRN Noreta Kue, NP       alum & mag hydroxide-simeth (MAALOX/MYLANTA) 200-200-20 MG/5ML suspension 30 mL  30 mL Oral Q4H PRN Ajibola, Ene A, NP       diphenhydrAMINE (BENADRYL) capsule 50 mg   50 mg Oral TID PRN Ajibola, Ene A, NP       Or   diphenhydrAMINE (BENADRYL) injection 50 mg  50 mg Intramuscular TID PRN Ajibola, Ene A, NP       haloperidol (HALDOL) tablet 5 mg  5 mg Oral TID PRN Ajibola, Ene A, NP       Or   haloperidol lactate (HALDOL) injection 5 mg  5 mg Intramuscular TID PRN Ajibola, Ene A, NP       hydrOXYzine (ATARAX) tablet 50 mg  50 mg Oral TID PRN Starleen Blue, NP       loratadine (CLARITIN) tablet 10 mg  10 mg Oral Daily Starleen Blue, NP   10 mg at 01/22/23 1239   LORazepam (ATIVAN) tablet 2 mg  2 mg Oral TID PRN Ajibola, Ene A, NP       Or   LORazepam (ATIVAN) injection 2 mg  2 mg Intramuscular TID PRN Ajibola, Ene A, NP       magnesium hydroxide (MILK OF MAGNESIA) suspension 30 mL  30 mL Oral Daily PRN Ajibola, Ene A, NP       polyethylene glycol (MIRALAX / GLYCOLAX) packet 17 g  17 g Oral Daily Starleen Blue, NP   17 g at 01/22/23 1610   risperiDONE (RISPERDAL) tablet 2 mg  2 mg Oral QHS Branson Kranz, NP        Lab Results:  Results for orders placed or performed during the hospital encounter of 01/19/23 (from the past 48 hour(s))  Hemoglobin A1c     Status: Abnormal   Collection Time: 01/20/23  6:34 PM  Result Value Ref Range   Hgb A1c MFr Bld 5.8 (H) 4.8 - 5.6 %    Comment: (NOTE) Pre diabetes:          5.7%-6.4%  Diabetes:              >6.4%  Glycemic control for   <7.0% adults with diabetes    Mean Plasma Glucose 119.76 mg/dL    Comment: Performed at [facility], [address], [place], [place] [address]  Lipid panel     Status: Abnormal   Collection Time: 01/20/23  6:34 PM  Result Value Ref Range   Cholesterol 108 0 - 200 mg/dL   Triglycerides 90 <409 mg/dL   HDL 33 (L) >  40 mg/dL   Total CHOL/HDL Ratio 3.3 RATIO   VLDL 18 0 - 40 mg/dL   LDL Cholesterol 57 0 - 99 mg/dL    Comment:        Total Cholesterol/HDL:CHD Risk Coronary Heart Disease Risk Table                     Men   Women  1/2 Average Risk   3.4   3.3   Average Risk       5.0   4.4  2 X Average Risk   9.6   7.1  3 X Average Risk  23.4   11.0        Use the calculated Patient Ratio above and the CHD Risk Table to determine the patient's CHD Risk.        ATP III CLASSIFICATION (LDL):  <100     mg/dL   Optimal  403-474  mg/dL   Near or Above                    Optimal  130-159  mg/dL   Borderline  259-563  mg/dL   High  >875     mg/dL   Very High Performed at [facility], [address], [place], [place] [address]   Basic metabolic panel     Status: Abnormal   Collection Time: 01/20/23  6:34 PM  Result Value Ref Range   Sodium 135 135 - 145 mmol/L   Potassium 3.9 3.5 - 5.1 mmol/L   Chloride 100 98 - 111 mmol/L   CO2 26 22 - 32 mmol/L   Glucose, Bld 100 (H) 70 - 99 mg/dL    Comment: Glucose reference range applies only to samples taken after fasting for at least 8 hours.   BUN 9 6 - 20 mg/dL   Creatinine, Ser 9.51 0.44 - 1.00 mg/dL   Calcium 9.2 8.9 - 88.4 mg/dL   GFR, Estimated >16 >60 mL/min    Comment: (NOTE) Calculated using the CKD-EPI Creatinine Equation (2021)    Anion gap 9 5 - 15    Comment: Performed at [facility], [address], [place], [place] [address]  VITAMIN D 25 Hydroxy (Vit-D Deficiency, Fractures)     Status: None   Collection Time: 01/20/23  6:34 PM  Result Value Ref Range   Vit D, 25-Hydroxy 32.23 30 - 100 ng/mL    Comment: (NOTE) Vitamin D deficiency has been defined by the Institute of Medicine  and an Endocrine Society practice guideline as a level of serum 25-OH  vitamin D less than 20 ng/mL (1,2). The Endocrine Society went on to  further define vitamin D insufficiency as a level between 21 and 29  ng/mL (2).  1. IOM (Institute of Medicine). 2010. Dietary reference intakes for  calcium and D. [place]: [employer]. 2. Holick MF, Binkley [place], Bischoff-Ferrari HA, et al. Evaluation,  treatment, and prevention of vitamin D deficiency:  an Endocrine  Society clinical practice guideline, JCEM. 2011 Jul; 96(7): 1911-30.  Performed at [facility], [address] [place], [place] [address]     Blood Alcohol level:  Lab Results  Component Value Date   [facility] <10 01/18/2023   ETH <10 01/18/2023    Metabolic Disorder Labs: Lab Results  Component Value Date   HGBA1C 5.8 (H) 01/20/2023   MPG 119.76 01/20/2023   No results found for: "PROLACTIN" Lab Results  Component Value Date   CHOL 108 01/20/2023   TRIG 90 01/20/2023   HDL 33 (L) 01/20/2023   CHOLHDL 3.3 01/20/2023   VLDL 18 01/20/2023   LDLCALC 57 01/20/2023   LDLCALC 89 05/03/2016    Physical Findings: AIMS:  , ,  ,  ,    CIWA:    COWS:     Musculoskeletal: Strength & Muscle Tone: within normal limits Gait & Station: normal Patient leans: N/A  Psychiatric Specialty Exam:  Presentation  General Appearance:  Appropriate for Environment; Casual; Fairly Groomed  Eye Contact: Fair  Speech: Clear and Coherent  Speech Volume: Normal  Handedness: Right   Mood and Affect  Mood: Depressed; Anxious  Affect: Congruent   Thought Process  Thought Processes: Coherent  Descriptions of Associations:Intact  Orientation:Full (Time, Place and Person)  Thought Content:Logical  History of Schizophrenia/Schizoaffective disorder:No  Duration of Psychotic Symptoms:Less than six months  Hallucinations:Hallucinations: None  Ideas of Reference:None  Suicidal Thoughts:Suicidal Thoughts: No  Homicidal Thoughts:Homicidal Thoughts: No  Sensorium  Memory: Immediate Good  Judgment: Fair  Insight: Good  Executive Functions  Concentration: Good  Attention Span: Good  Recall: Good  Fund of Knowledge: Good  Language: Good  Psychomotor Activity  Psychomotor Activity: Psychomotor Activity: Normal  Assets  Assets: Communication Skills; Social Support  Sleep  Sleep: Sleep: Good  Physical Exam: Physical  Exam Constitutional:      Appearance: Normal appearance.  HENT:     Head: Normocephalic.     Nose: Nose normal.  Eyes:     Pupils: Pupils are equal, round, and reactive to light.  Pulmonary:     Effort: Pulmonary effort is normal.  Musculoskeletal:     Cervical back: Normal range of motion.  Neurological:     Mental Status: She is alert and oriented to person, place, and time.    Review of Systems  Constitutional: Negative.   HENT: Negative.    Eyes: Negative.   Respiratory: Negative.    Cardiovascular: Negative.   Gastrointestinal:  Negative for heartburn.  Genitourinary:  Negative for dysuria.  Musculoskeletal: Negative.   Skin: Negative.   Neurological:  Negative for dizziness.  Psychiatric/Behavioral:  Positive for depression and substance abuse. Negative for hallucinations, memory loss and suicidal ideas. The patient is nervous/anxious and has insomnia.    Blood pressure 102/64, pulse (!) 101, temperature 98.4 F (36.9 C), temperature source Oral, resp. rate 12, height 5' (1.524 m), weight 59.9 kg, last menstrual period 01/14/2023, SpO2 100%. Body mass index is 25.78 kg/m.  Treatment Plan Summary: Daily contact with patient to assess and evaluate symptoms and progress in treatment and Medication management   Safety and Monitoring: Voluntary admission to inpatient psychiatric unit for safety, stabilization and treatment Daily contact with patient to assess and evaluate symptoms and progress in treatment Patient's case to be discussed in multi-disciplinary team meeting Observation Level : q15 minute checks Vital signs: q12 hours Precautions: Safety   Long Term Goal(s): Improvement in symptoms so as ready for discharge   Short Term Goals: Ability to identify changes in lifestyle to reduce recurrence of condition will improve, Ability to verbalize feelings will improve, Ability to disclose and discuss suicidal ideas, Ability to demonstrate self-control will improve,  Ability to identify and develop effective coping behaviors will improve, Ability to maintain clinical measurements within normal limits will improve, and Compliance with prescribed medications will improve   Diagnoses Principal Problem:   Bipolar disorder, curr episode mixed, severe, with psychotic features (HCC) Active Problems:  GAD (generalized anxiety disorder)   Insomnia   Medications -Discontinued Abilify 5 mg after 2 doses on 8/18 as intense nature of psychosis required more potent antipsychotic med -Continue to hold Lexapro due to risk of triggering mania -Discontinued trazodone 100 mg nightly on 8/19 due to c/o dizziness & ineffectiveness -Continue Hydroxyzine 50 mg TID PRN   -Continue Risperdal 2 mg nightly for psychosis starting 8/20 @ 2100 -Continue Miralax daily for constipation -Give Mg Citrate x 1 dose on 8/20    PRNS -Discontinue hydroxyzine 25 mg 3 times daily as needed for anxiety -Continue agitation protocol: Ativan/Benadryl/Haldol 3 times daily as needed for agitation as per the MAR -Continue Tylenol 650 mg every 6 hours PRN for mild pain -Continue Maalox 30 mg every 4 hrs PRN for indigestion -Continue Milk of Magnesia as needed every 6 hrs for constipation   ANTIPSYCHOTIC CONSENT : We discussed the risks, benefits, side effects, and alternatives to THE PRESCRIBED ANTIPSYCHOTIC, including but not limited to, the risk of fatigue, sedation, metabolic syndrome, weight gain, movement abnormalities such as tremor & cogwheeling & tardive dyskinesia, temperature sensitivity, photosensitivity, blood pressure changes, heart rhythm effects, potential for medication interactions, and to not take these medications with alcohol or illicit drugs; informed consent was obtained. We discussed the necessity for routine monitoring including rating scales of abnormal movements, blood work, and ekgs, while the patient is prescribed antipsychotic medication.    Labs reviewed: Potassium  level now WNL, other labs reviewed.   Discharge Planning: Social work and case management to assist with discharge planning and identification of hospital follow-up needs prior to discharge Estimated LOS: 5-7 days Discharge Concerns: Need to establish a safety plan; Medication compliance and effectiveness Discharge Goals: Return home with outpatient referrals for mental health follow-up including medication management/psychotherapy   I certify that inpatient services furnished can reasonably be expected to improve the patient's condition.    Starleen Blue, NP 01/22/2023, 3:25 PM Patient ID: Ashley Cole, female   DOB: Sep 10, 1985, 37 y.o.   MRN: [id number]

## 2023-01-22 NOTE — Progress Notes (Signed)
Chaplain received a consult to provide support to Ashley Cole around her suicidal thoughts.  Chaplain spoke with Ashley Cole following the grief and loss group where Ashley Cole shared openly.  Ashley Cole declined to speak individually stating that she is doing fine.    [address] Bcc Pager, [phone]

## 2023-01-22 NOTE — BHH Group Notes (Signed)
Spiritual care group on grief and loss facilitated by Chaplain Dyanne Carrel, Bcc  Group Goal: Support / Education around grief and loss  Members engage in facilitated group support and psycho-social education.  Group Description:  Following introductions and group rules, group members engaged in facilitated group dialogue and support around topic of loss, with particular support around experiences of loss in their lives. Group Identified types of loss (relationships / self / things) and identified patterns, circumstances, and changes that precipitate losses. Reflected on thoughts / feelings around loss, normalized grief responses, and recognized variety in grief experience. Group encouraged individual reflection on safe space and on the coping skills that they are already utilizing.  Group drew on Adlerian / Rogerian and narrative framework  Patient Progress: Ashley Cole attended group and actively engaged and participated in group conversation and activities.  She shared about her own experience of grief with her relationship with her mother.  Her comments demonstrated good insight and she was supportive of other group members.

## 2023-01-22 NOTE — Progress Notes (Signed)
   01/22/23 0800  Psych Admission Type (Psych Patients Only)  Admission Status Involuntary  Psychosocial Assessment  Patient Complaints Depression  Eye Contact Fair  Facial Expression Flat  Affect Appropriate to circumstance  Speech Logical/coherent  Interaction Assertive  Motor Activity Other (Comment) (WDL)  Appearance/Hygiene Unremarkable  Behavior Characteristics Cooperative;Appropriate to situation  Mood Depressed;Pleasant  Thought Process  Coherency WDL  Content WDL  Delusions None reported or observed  Perception WDL  Hallucination None reported or observed  Judgment Impaired  Confusion None  Danger to Self  Current suicidal ideation? Denies  Description of Suicide Plan n/a  Agreement Not to Harm Self Yes  Description of Agreement vebal  Danger to Others  Danger to Others None reported or observed

## 2023-01-22 NOTE — Progress Notes (Signed)
   01/21/23 2126  Psych Admission Type (Psych Patients Only)  Admission Status Involuntary  Psychosocial Assessment  Patient Complaints Depression  Eye Contact Fair  Facial Expression Flat  Affect Appropriate to circumstance  Speech Logical/coherent  Interaction Assertive  Motor Activity Other (Comment) (WDL)  Appearance/Hygiene Unremarkable  Behavior Characteristics Cooperative;Appropriate to situation  Mood Depressed;Pleasant  Thought Process  Coherency WDL  Content WDL  Delusions None reported or observed  Perception WDL  Hallucination None reported or observed  Judgment Impaired  Confusion None  Danger to Self  Current suicidal ideation? Denies  Agreement Not to Harm Self Yes  Description of Agreement verbal  Danger to Others  Danger to Others None reported or observed

## 2023-01-22 NOTE — Progress Notes (Signed)
   01/22/23 0600  15 Minute Checks  Location Bedroom  Visual Appearance Calm  Behavior Sleeping  Sleep (Behavioral Health Patients Only)  Calculate sleep? (Click Yes once per 24 hr at 0600 safety check) Yes  Documented sleep last 24 hours 8.25

## 2023-01-22 NOTE — Group Note (Signed)
Recreation Therapy Group Note   Group Topic:Animal Assisted Therapy   Group Date: 01/22/2023 Start Time: 0945 End Time: 1030 Facilitators: Shela Esses-McCall, LRT,CTRS Location: [address] Day[address]   Animal-Assisted Activity (AAA) Program Checklist/Progress Notes Patient Eligibility Criteria Checklist & Daily Group note for Rec Tx Intervention  AAA/T Program Assumption of Risk Form signed by Patient/ or Parent Legal Guardian Yes  Patient is free of allergies or severe asthma Yes  Patient reports no fear of animals Yes  Patient reports no history of cruelty to animals Yes  Patient understands his/her participation is voluntary Yes  Patient washes hands before animal contact Yes  Patient washes hands after animal contact Yes   Affect/Mood: Appropriate   Participation Level: Engaged   Participation Quality: Independent   Behavior: Appropriate    Clinical Observations/Individualized Feedback: Patient attended session and interacted appropriately with therapy dog and peers. Patient asked appropriate questions about therapy dog and his training. Patient shared stories about their pets at home with group.     Plan: Continue to engage patient in RT group sessions 2-3x/week.   Ashley Cole, LRT,CTRS  01/22/2023 1:34 PM

## 2023-01-22 NOTE — Group Note (Signed)
[facility] LCSW Group Therapy Note   Group Date: 01/22/2023 Start Time: 1100 End Time: 1140   Type of Therapy/Topic:  Group Therapy:  Emotion Regulation  Participation Level:  Active   Mood:  Description of Group:    The purpose of this group is to assist patients in learning to regulate negative emotions and experience positive emotions. Patients will be guided to discuss ways in which they have been vulnerable to their negative emotions. These vulnerabilities will be juxtaposed with experiences of positive emotions or situations, and patients challenged to use positive emotions to combat negative ones. Special emphasis will be placed on coping with negative emotions in conflict situations, and patients will process healthy conflict resolution skills.  Therapeutic Goals: Patient will identify two positive emotions or experiences to reflect on in order to balance out negative emotions:  Patient will label two or more emotions that they find the most difficult to experience:  Patient will be able to demonstrate positive conflict resolution skills through discussion or role plays:   Summary of Patient Progress:   Patient was engaged and present for entire group    Therapeutic Modalities:   Cognitive Behavioral Therapy Feelings Identification Dialectical Behavioral Therapy   Starleen Arms, LCSW

## 2023-01-22 NOTE — Plan of Care (Signed)
  Problem: Activity: Goal: Interest or engagement in activities will improve Outcome: Progressing   Problem: Coping: Goal: Ability to verbalize frustrations and anger appropriately will improve Outcome: Progressing   Problem: Safety: Goal: Periods of time without injury will increase Outcome: Progressing   

## 2023-01-22 NOTE — Plan of Care (Signed)

## 2023-01-23 DIAGNOSIS — F3164 Bipolar disorder, current episode mixed, severe, with psychotic features: Secondary | ICD-10-CM

## 2023-01-23 MED ORDER — POLYETHYLENE GLYCOL 3350 17 G PO PACK: 17.0000 g | PACK | Freq: Every day | ORAL | Status: DC | PRN

## 2023-01-23 NOTE — Plan of Care (Signed)
  Problem: Education: Goal: Emotional status will improve Outcome: Progressing Goal: Mental status will improve Outcome: Progressing   Problem: Activity: Goal: Interest or engagement in activities will improve Outcome: Progressing Goal: Sleeping patterns will improve Outcome: Progressing

## 2023-01-23 NOTE — BHH Group Notes (Signed)
Spirituality group facilitated by Chaplain Katy Claussen, BCC.   Group Description: Group focused on topic of hope. Patients participated in facilitated discussion around topic, connecting with one another around experiences and definitions for hope. Group members engaged with visual explorer photos, reflecting on what hope looks like for them today. Group engaged in discussion around how their definitions of hope are present today in hospital.   Modalities: Psycho-social ed, Adlerian, Narrative, MI   Patient Progress: Did not attend.  

## 2023-01-23 NOTE — Progress Notes (Signed)
Pt denied SI/HI/AVH this morning. Pt rated her depression a 0/10, anxiety a 4/10, and feelings of hopelessness a 0/10. PRN Hydroxyzine administered for anxiety per MAR. Patient reports that she slept "fair" last night. Pt reports that she was able to have multiple bowel movements last night following mag citrate administration. Pt has been pleasant, calm, and cooperative throughout the shift. RN provided support and encouragement to patient. Pt given scheduled medications as prescribed. Q15 min checks verified for safety.  Patient verbally contracts for safety. Patient compliant with medications and treatment plan. Patient is interacting well on the unit. Pt is safe on the unit.   01/23/23 1000  Psych Admission Type (Psych Patients Only)  Admission Status Involuntary  Psychosocial Assessment  Patient Complaints Anxiety;Depression  Eye Contact Fair  Facial Expression Flat  Affect Depressed  Speech Logical/coherent  Interaction Assertive  Motor Activity Other (Comment) (WDL)  Appearance/Hygiene Unremarkable  Behavior Characteristics Cooperative;Appropriate to situation  Mood Depressed;Pleasant  Thought Process  Coherency WDL  Content WDL  Delusions None reported or observed  Perception WDL  Hallucination None reported or observed  Judgment Impaired  Confusion None  Danger to Self  Current suicidal ideation? Denies  Description of Suicide Plan No plan  Agreement Not to Harm Self Yes  Description of Agreement Verbal  Danger to Others  Danger to Others None reported or observed

## 2023-01-23 NOTE — Progress Notes (Signed)
   01/23/23 0000  Psych Admission Type (Psych Patients Only)  Admission Status Involuntary  Psychosocial Assessment  Patient Complaints Depression  Eye Contact Fair  Facial Expression Flat  Affect Appropriate to circumstance  Speech Logical/coherent  Interaction Assertive  Motor Activity Other (Comment) (WDL)  Appearance/Hygiene Unremarkable  Behavior Characteristics Cooperative;Appropriate to situation  Mood Depressed;Pleasant  Thought Process  Coherency WDL  Content WDL  Delusions None reported or observed  Perception WDL  Hallucination None reported or observed  Judgment Impaired  Confusion None  Danger to Self  Current suicidal ideation? Denies  Agreement Not to Harm Self Yes  Description of Agreement verbal  Danger to Others  Danger to Others None reported or observed

## 2023-01-23 NOTE — Group Note (Signed)
Recreation Therapy Group Note   Group Topic:Problem Solving  Group Date: 01/23/2023 Start Time: 0935 End Time: 1005 Facilitators: Frederich Montilla-McCall, LRT,CTRS Location: [address] Day[address]   Goal Area(s) Addresses:  Patient will effectively work with peer towards shared goal.  Patient will identify skills used to make activity successful.  Patient will share challenges and verbalize solution-driven approaches used. Patient will identify how skills used during activity can be used to reach post d/c goals.   Group Description: [employer]. Patients were provided the following materials: 4 drinking straws, 5 rubber bands, 5 paper clips, 2 index cards, 2 drinking cups, and 2 toilet paper rolls. Using the provided materials patients were asked to build a launching mechanism to launch a ping pong ball across the room, approximately 10 feet. Patients were divided into teams of 3-5. Instructions required all materials be incorporated into the device, functionality of items left to the peer group's discretion.   Affect/Mood: N/A   Participation Level: Did not attend    Clinical Observations/Individualized Feedback:     Plan: Continue to engage patient in RT group sessions 2-3x/week.   Kanton Kamel-McCall, LRT,CTRS  01/23/2023 1:02 PM

## 2023-01-23 NOTE — Plan of Care (Signed)

## 2023-01-23 NOTE — Progress Notes (Signed)
[facility] MD Progress Note  01/23/2023 12:53 PM Ashley Cole  MRN:  [id number] Principal Problem: Bipolar disorder, curr episode mixed, severe, with psychotic features (HCC) Diagnosis: Principal Problem:   Bipolar disorder, curr episode mixed, severe, with psychotic features (HCC) Active Problems:   GAD (generalized anxiety disorder)   Insomnia  Reason for Admission: Ashley Cole is a 37 year old African-American female with prior mental health history of MDD and GAD who presented to the [facility] urgent care [facility]) on 8/16 with complaints of worsening psychosis; reported +AH & lack of sleep for a week prior to presentation to [facility]. Exhibiting behaviors at the [facility] such as "screaming, shaking, crying and jumping up and down", and stating "help me". Mother also reported that pt had called the Sheriff's dept stating that she felt unsafe at home. Pt was transferred to this [facility] via IVC for treatment and stabilization of her mental status.   24 hr chart review: Sleep Hours last night were not documented, but pt states that she slept well during the hours that she was able to sleep, but was up a lot due to using the bathroom a lot since Mag Citrate was given to her too late into the night.  Nursing Concerns: none Behavioral episodes in the past 24 hrs: none Medication Compliance: compliant  Vital Signs in the past 24 hrs: WNL with exception of HR which was slightly elevated at 108 earlier today morning. Pt asymptomatic PRN Medications in the past 24 hrs: Hydroxyzine   Patient assessment note:  On assessment today, the pt reports that their mood is euthymic, improved since admission, and stable. Denies feeling down, depressed, or sad.  Reports that anxiety symptoms are at manageable level.  Sleep is stable. Appetite is stable.  Concentration is without complaint.  Energy level is adequate. Denies having any suicidal thoughts. Denies having any suicidal intent and plan.  Denies having any HI.  Denies having psychotic symptoms.   Denies having side effects to current psychiatric medications.  No TD/EPS type symptoms found on assessment, and pt denies any feelings of stiffness. AIMS: 0. Discussed discharge planning. Pt denies any current concerns, and plans to reside with her mother for sometime after discharge prior to going to her own place.  Appointments will also have to be completed for med management and therapy. Discharge date remains  01/24/23.          Total Time spent with patient: 45 minutes  Past Psychiatric History: See H & P  Past Medical History:  Past Medical History:  Diagnosis Date   Abnormal Pap smear    Anemia    Anxiety    Chlamydia    COVID-19    GERD (gastroesophageal reflux disease)    Headache(784.0)    Human papilloma virus    cells removed x2   Spinal headache    Trichomonas    Urinary tract infection    Vaginal Pap smear, abnormal     Past Surgical History:  Procedure Laterality Date   DILATION AND CURETTAGE OF UTERUS     GYNECOLOGIC CRYOSURGERY     PILONIDAL CYST EXCISION     THERAPEUTIC ABORTION     Family History:  Family History  Problem Relation Age of Onset   Hypertension Mother    Fibroids Mother    Cancer Mother         ovarian   Cancer Maternal Grandfather        cervical   Thyroid disease Brother    Family Psychiatric  History: See H & P Social History:  Social History   Substance and Sexual Activity  Alcohol Use Yes   Comment:  socially     Social History   Substance and Sexual Activity  Drug Use No    Social History   Socioeconomic History   Marital status: Legally Separated    Spouse name: Not on file   Number of children: Not on file   Years of education: Not on file   Highest education level: Not on file  Occupational History   Not on file  Tobacco Use   Smoking status: Every Day    Types: Cigarettes   Smokeless tobacco: Never  Vaping Use   Vaping status: Never Used  Substance and Sexual Activity   Alcohol use: Yes    Comment:  socially   Drug use: No   Sexual activity: Yes    Birth control/protection: None    Comment: husband is sterile  Other Topics Concern   Not on file  Social History Narrative   Not on file   Social Determinants of Health   Financial Resource Strain: Not on file  Food Insecurity: No Food Insecurity (01/19/2023)   Hunger Vital Sign  Worried About Programme researcher, broadcasting/film/video in the Last Year: Never true    Ran Out of Food in the Last Year: Never true  Transportation Needs: No Transportation Needs (01/19/2023)   PRAPARE - Administrator, Civil Service (Medical): No    Lack of Transportation (Non-Medical): No  Physical Activity: Not on file  Stress: Not on file  Social Connections: Not on file   Sleep: Poor  Appetite:  Fair  Current Medications: Current Facility-Administered Medications  Medication Dose Route Frequency Provider Last Rate Last Admin   acetaminophen (TYLENOL) tablet 650 mg  650 mg Oral Q6H PRN Ajibola, Ene A, NP       albuterol (VENTOLIN HFA) 108 (90 Base) MCG/ACT inhaler 2 puff  2 puff Inhalation Q4H PRN Paula Zietz, NP       alum & mag hydroxide-simeth (MAALOX/MYLANTA) 200-200-20 MG/5ML suspension 30 mL  30  mL Oral Q4H PRN Ajibola, Ene A, NP       diphenhydrAMINE (BENADRYL) capsule 50 mg  50 mg Oral TID PRN Ajibola, Ene A, NP       Or   diphenhydrAMINE (BENADRYL) injection 50 mg  50 mg Intramuscular TID PRN Ajibola, Ene A, NP       haloperidol (HALDOL) tablet 5 mg  5 mg Oral TID PRN Ajibola, Ene A, NP       Or   haloperidol lactate (HALDOL) injection 5 mg  5 mg Intramuscular TID PRN Ajibola, Ene A, NP       hydrOXYzine (ATARAX) tablet 50 mg  50 mg Oral TID PRN Starleen Blue, NP   50 mg at 01/23/23 0912   loratadine (CLARITIN) tablet 10 mg  10 mg Oral Daily Starleen Blue, NP   10 mg at 01/23/23 0912   LORazepam (ATIVAN) tablet 2 mg  2 mg Oral TID PRN Ajibola, Ene A, NP       Or   LORazepam (ATIVAN) injection 2 mg  2 mg Intramuscular TID PRN Ajibola, Ene A, NP       magnesium hydroxide (MILK OF MAGNESIA) suspension 30 mL  30 mL Oral Daily PRN Ajibola, Ene A, NP       polyethylene glycol (MIRALAX / GLYCOLAX) packet 17 g  17 g Oral Daily PRN Starleen Blue, NP       risperiDONE (RISPERDAL) tablet 2 mg  2 mg Oral QHS Milcah Dulany, NP   2 mg at 01/22/23 2119    Lab Results:  No results found for this or any previous visit (from the past 48 hour(s)).   Blood Alcohol level:  Lab Results  Component Value Date   ETH <10 01/18/2023   ETH <10 01/18/2023    Metabolic Disorder Labs: Lab Results  Component Value Date   HGBA1C 5.8 (H) 01/20/2023   MPG 119.76 01/20/2023   No results found for: "PROLACTIN" Lab Results  Component Value Date   CHOL 108 01/20/2023   TRIG 90 01/20/2023   HDL 33 (L) 01/20/2023   CHOLHDL 3.3 01/20/2023   VLDL 18 01/20/2023   LDLCALC 57 01/20/2023   LDLCALC 89 05/03/2016    Physical Findings: AIMS:  , ,  ,  ,    CIWA:    COWS:     Musculoskeletal: Strength & Muscle Tone: within normal limits Gait & Station: normal Patient leans: N/A  Psychiatric Specialty Exam:  Presentation  General Appearance:  Appropriate for Environment; Fairly Groomed  Eye  Contact: Fair  Speech: Clear and Coherent  Speech Volume: Normal  Handedness: Right   Mood and Affect  Mood: Depressed; Anxious  Affect: Appropriate   Thought Process  Thought Processes: Coherent  Descriptions of Associations:Intact  Orientation:Full (Time, Place and Person)  Thought Content:Logical  History of Schizophrenia/Schizoaffective disorder:No  Duration of Psychotic Symptoms:Less than six months  Hallucinations:Hallucinations: None  Ideas of Reference:None  Suicidal Thoughts:Suicidal Thoughts: No  Homicidal Thoughts:Homicidal Thoughts: No  Sensorium  Memory: Immediate Good  Judgment: Good  Insight: Good  Executive Functions  Concentration: Good  Attention Span: Good  Recall: Good  Fund of Knowledge: Good  Language: Good  Psychomotor Activity  Psychomotor Activity: Psychomotor Activity: Normal  Assets  Assets: Communication Skills  Sleep  Sleep: Sleep: Good  Physical Exam: Physical Exam Constitutional:      Appearance: Normal appearance.  HENT:     Head: Normocephalic.     Nose: Nose normal.  Eyes:     Pupils: Pupils are equal, round, and reactive to light.  Pulmonary:     Effort: Pulmonary effort is normal.  Musculoskeletal:     Cervical back: Normal range of motion.  Neurological:     Mental Status: She is alert and oriented to person, place, and time.    Review of Systems  Constitutional: Negative.   HENT: Negative.    Eyes: Negative.   Respiratory: Negative.    Cardiovascular: Negative.   Gastrointestinal:  Negative for heartburn.  Genitourinary:  Negative for dysuria.  Musculoskeletal: Negative.   Skin: Negative.   Neurological:  Negative for dizziness.  Psychiatric/Behavioral:  Positive for depression and substance abuse. Negative for hallucinations, memory loss and suicidal ideas. The patient is nervous/anxious and has insomnia.    Blood pressure 98/64, pulse (!) 108, temperature 98.4 F  (36.9 C), temperature source Oral, resp. rate 12, height 5' (1.524 m), weight 59.9 kg, last menstrual period 01/14/2023, SpO2 100%. Body mass index is 25.78 kg/m.  Treatment Plan Summary: Daily contact with patient to assess and evaluate symptoms and progress in treatment and Medication management   Safety and Monitoring: Voluntary admission to inpatient psychiatric unit for safety, stabilization and treatment Daily contact with patient to assess and evaluate symptoms and progress in treatment Patient's case to be discussed in multi-disciplinary team meeting Observation Level : q15 minute checks Vital signs: q12 hours Precautions: Safety   Long Term Goal(s): Improvement in symptoms so as ready for discharge   Short Term Goals: Ability to identify changes in lifestyle to reduce recurrence of condition will improve, Ability to verbalize feelings will improve, Ability to disclose and discuss suicidal ideas, Ability to demonstrate self-control will improve, Ability to identify and develop effective coping behaviors will improve, Ability to maintain clinical measurements within normal limits will improve, and Compliance with prescribed medications will improve   Diagnoses Principal Problem:   Bipolar disorder, curr episode mixed, severe, with psychotic features (HCC) Active Problems:   GAD (generalized anxiety disorder)   Insomnia   Medications -Continue Hydroxyzine 50 mg TID PRN   -Continue Risperdal 2 mg nightly for psychosis  -Change Miralax from daily for constipation to PRN -Continue Claritin 10 mg daily for seasonal allergies   Dc'd meds -Discontinued Abilify 5 mg after 2 doses on 8/18 as intense nature of psychosis required more potent antipsychotic med -Continue to hold Lexapro due to risk of triggering mania -Discontinued trazodone 100 mg nightly on 8/19 due to c/o dizziness & ineffectiveness -Given Mg Citrate x 1 dose on 8/20 with positive result of multiple loose bowel  movements   PRNS -Continue Albuterol  PRN for wheezing and SOB -Continue agitation protocol: Ativan/Benadryl/Haldol 3 times daily as needed for agitation as per the MAR -Continue Tylenol 650 mg every 6 hours PRN for mild pain -Continue Maalox 30 mg every 4 hrs PRN for indigestion -Continue Milk of Magnesia as needed every 6 hrs for constipation   ANTIPSYCHOTIC CONSENT : We discussed the risks, benefits, side effects, and alternatives to THE PRESCRIBED ANTIPSYCHOTIC, including but not limited to, the risk of fatigue, sedation, metabolic syndrome, weight gain, movement abnormalities such as tremor & cogwheeling & tardive dyskinesia, temperature sensitivity, photosensitivity, blood pressure changes, heart rhythm effects, potential for medication interactions, and to not take these medications with alcohol or illicit drugs; informed consent was obtained. We discussed the necessity for routine monitoring including rating scales of abnormal movements, blood work, and ekgs, while the patient is prescribed antipsychotic medication.    Labs reviewed: Potassium level now WNL, other labs reviewed.   Discharge Planning: Social work and case management to assist with discharge planning and identification of hospital follow-up needs prior to discharge Estimated LOS: 5-7 days Discharge Concerns: Need to establish a safety plan; Medication compliance and effectiveness Discharge Goals: Return home with outpatient referrals for mental health follow-up including medication management/psychotherapy   I certify that inpatient services furnished can reasonably be expected to improve the patient's condition.    Starleen Blue, NP 01/23/2023, 12:53 PM Patient ID: Ashley Cole, female   DOB: May 05, 1986, 37 y.o.   MRN: [id number]

## 2023-01-24 DIAGNOSIS — F3164 Bipolar disorder, current episode mixed, severe, with psychotic features: Secondary | ICD-10-CM | POA: Diagnosis not present

## 2023-01-24 MED ORDER — ALBUTEROL SULFATE HFA 108 (90 BASE) MCG/ACT IN AERS: 2.0000 | INHALATION_SPRAY | RESPIRATORY_TRACT | Status: DC | PRN

## 2023-01-24 MED ORDER — RISPERIDONE 2 MG PO TABS: 2.0000 mg | ORAL_TABLET | Freq: Every day | ORAL | 0 refills | Status: DC

## 2023-01-24 MED ORDER — LORATADINE 10 MG PO TABS: 10.0000 mg | ORAL_TABLET | Freq: Every day | ORAL | Status: DC

## 2023-01-24 MED ORDER — HYDROXYZINE HCL 50 MG PO TABS: 50.0000 mg | ORAL_TABLET | Freq: Three times a day (TID) | ORAL | 0 refills | Status: DC | PRN

## 2023-01-24 MED ORDER — POLYETHYLENE GLYCOL 3350 17 G PO PACK: 17.0000 g | PACK | Freq: Every day | ORAL | 0 refills | Status: DC | PRN

## 2023-01-24 NOTE — Progress Notes (Signed)
Patient discharged from [facility] on 01/24/23 at 1125. Patient denies SI, plan, and intention. Suicide safety plan completed, reviewed with this RN, given to the patient, and a copy in the chart. Patient denies HI/AVH upon discharge. Patient rates her depression a 0/10 and her anxiety a 0/10. Patient is alert, oriented, and cooperative. RN provided patient with discharge paperwork and reviewed information with patient. Patient expressed that she understood all of the discharge instructions. Pt was satisfied with belongings returned to her from the locker and at bedside. Discharged patient to [facility] waiting room. Pt's mother awaiting patient in the [facility] waiting room.

## 2023-01-24 NOTE — Progress Notes (Signed)
  [facility] Adult Case Management Discharge Plan :  Will you be returning to the same living situation after discharge:  No. At discharge, do you have transportation home?: Yes,  Mother  Do you have the ability to pay for your medications: Yes,  insurance  Release of information consent forms completed and in the chart;  Patient's signature needed at discharge.  Patient to Follow up at:  Follow-up Information     [facility] Go on 01/29/2023.   Why: You have an appointment for therapy services with your therapist on 01/29/23 at 2:00 pm, in person. Contact information: [address] [place] [address] [phone]         Mozingo, Thereasa Solo, NP. Schedule an appointment as soon as possible for a visit.   Specialty: Psychiatry Why: To establish care with this provider office, please call to schedule an appointment for medication management services, and pay the $100 deposit. Contact information: [address] [place] [address] [phone]         [facility] Follow up.   Why: You may also call this provider for medication management services. Contact information: [address] [place] [address] [phone]                 Next level of care provider has access to [facility] Link:no  Safety Planning and Suicide Prevention discussed: Valentino Hue,  Harrel Lemon [phone]     Has patient been referred to the Quitline?: Patient refused referral for treatment  Patient has been referred for addiction treatment: No known substance use disorder.  Starleen Arms, LCSW 01/24/2023, 9:20 AM

## 2023-01-24 NOTE — Progress Notes (Signed)
   01/24/23 0900  Psych Admission Type (Psych Patients Only)  Admission Status Involuntary  Psychosocial Assessment  Patient Complaints None  Eye Contact Fair  Affect Appropriate to circumstance  Speech Logical/coherent  Interaction Assertive  Motor Activity Other (Comment) (WDL)  Appearance/Hygiene Unremarkable  Behavior Characteristics Cooperative;Appropriate to situation  Mood Depressed;Pleasant  Thought Process  Coherency WDL  Content WDL  Delusions None reported or observed  Perception WDL  Hallucination None reported or observed  Judgment WDL  Confusion None  Danger to Self  Current suicidal ideation? Denies  Description of Suicide Plan No plan  Agreement Not to Harm Self Yes  Description of Agreement Verbal  Danger to Others  Danger to Others None reported or observed

## 2023-01-24 NOTE — [facility] Group Notes (Signed)
[facility] Group Notes:  (Nursing/MHT/Case Management/Adjunct)  Date:  01/24/2023  Time:  4:02 AM  Type of Therapy:   wrap-up group  Participation Level:  Active  Participation Quality:  Appropriate  Affect:  Appropriate  Cognitive:  Appropriate  Insight:  Appropriate  Engagement in Group:  Engaged  Modes of Intervention:  Education  Summary of Progress/Problems: Goal to work on d/c. Day 9/10.  Ashley Cole 01/24/2023, 4:02 AM

## 2023-01-24 NOTE — Discharge Summary (Signed)
Physician Discharge Summary Note  Patient:  Ashley Cole is an 37 y.o., female  MRN:  [id number]  DOB:  1986-02-01  Patient phone:  [phone] (home)   Patient address:   [address] [place] [address],   Total Time spent with patient:  Greater than 30 minutes  Date of Admission:  01/19/2023  Date of Discharge: 01-24-23.  Reason for Admission: Worsening psychosis (auditory hallucinations & worsening insomnia of one week.  Principal Problem: Bipolar disorder, curr episode mixed, severe, with psychotic features [facility])  Discharge Diagnoses: Principal Problem:   Bipolar disorder, curr episode mixed, severe, with psychotic features (HCC) Active Problems:   GAD (generalized anxiety disorder)   Insomnia  Past Psychiatric History: See H&P.  Past Medical History:  Past Medical History:  Diagnosis Date   Abnormal Pap smear    Anemia    Anxiety    Chlamydia    COVID-19    GERD (gastroesophageal reflux disease)    Headache(784.0)    Human papilloma virus    cells removed x2   Spinal headache    Trichomonas    Urinary tract infection    Vaginal Pap smear, abnormal     Past Surgical History:  Procedure Laterality Date   DILATION AND CURETTAGE OF UTERUS     GYNECOLOGIC CRYOSURGERY     PILONIDAL CYST EXCISION     THERAPEUTIC ABORTION     Family History:  Family History  Problem Relation Age of Onset   Hypertension Mother    Fibroids Mother    Cancer Mother        ovarian   Cancer Maternal Grandfather        cervical   Thyroid disease Brother    Family Psychiatric  History: See H&P.  Social History:  Social History   Substance and Sexual Activity  Alcohol Use Yes   Comment:  socially     Social History   Substance and Sexual Activity  Drug Use No    Social History   Socioeconomic History   Marital status: Legally Separated    Spouse name: Not on file   Number of children: Not on file   Years of education: Not on file   Highest  education level: Not on file  Occupational History   Not on file  Tobacco Use   Smoking status: Every Day    Types: Cigarettes   Smokeless tobacco: Never  Vaping Use   Vaping status: Never Used  Substance and Sexual Activity   Alcohol use: Yes    Comment:  socially   Drug use: No   Sexual activity: Yes    Birth control/protection: None    Comment: husband is sterile  Other Topics Concern   Not on file  Social History Narrative   Not on file   Social Determinants of Health   Financial Resource Strain: Not on file  Food Insecurity: No Food Insecurity (01/19/2023)   Hunger Vital Sign    Worried About Running Out of Food in the Last Year: Never true    Ran Out of Food in the Last Year: Never true  Transportation Needs: No Transportation Needs (01/19/2023)   PRAPARE - Administrator, Civil Service (Medical): No    Lack of Transportation (Non-Medical): No  Physical Activity: Not on file  Stress: Not on file  Social Connections: Not on file   Hospital Course: (Per admission evaluation notes): 37 year old African-American female with prior mental health history of MDD and  GAD who presented to the [facility] behavioral health urgent care center [facility]) on 8/16 with complaints of worsening psychosis; reported +AH & lack of sleep for a week prior to presentation to [facility]. Exhibiting behaviors at the [facility] such as "screaming, shaking, crying and jumping up and down", and stating "help me". Mother also reported that pt had called the Sheriff's dept stating that she felt unsafe at home. Pt [facility] for treatment and stabilization of her mental status.   Upon the decision by her treatment team to discharge Ashley Cole today, she was seen & evaluated  by her treatment team for mood stability. The current laboratory findings were reviewed, stable. The nurses notes & vital signs were reviewed as well. All are stable. At this present time, there are no current  mental health or medical issues that should prevent this discharge at this time. Patient is being discharged to continue mental health care & medication management as noted below. She is also aware & agreeable to this discharge.  Although with what seems like an extensive hx of mental health issues & probable previous psychiatric hospitalizations/treatments, this is Ashley Cole's first psychiatric admission/discharge summary from this [facility]. She was admitted with complaint of  worsening psychosis (auditory hallucinations & worsening insomnia) of one week. She was recommended for mood stabilization treatments by her treatment team after her admission evaluation. And with her consent, Ashley Cole was treated, stabilized & discharged on the medications as listed below on her discharge medication lists. She was also enrolled & participated in the group counseling sessions being offered & held on this unit. She learned coping skills. She presented other significant pre-existing medical conditions that required treatment or monitoring. She was treated & discharged on all the pertinent medications used for those health issues. She tolerated her treatment regimen without any adverse effects or reactions reported. Ashley Cole's symptoms responded well to her treatment regimen without any reported side effects or adverse reactions. She is currently mentally & medically stable to be discharged to continue further mental health care/medication management as noted below. She is agreeable that this discharge is warranted.   During the course of her hospitalization, the 15-minute checks were adequate to ensure Ashley Cole's safety. Patient did not display any dangerous, violent or suicidal behavior on the unit.  She interacted with the  other patients & staff appropriately. She participated appropriately in the group sessions/therapies. Her medications were addressed & adjusted to meet her needs. She was recommended for outpatient follow-up care &  medication management upon discharge to assure her continuity of care.  At the time of discharge patient is not reporting any acute suicidal/homicidal ideations. She feels more confident about her self & mental health care. She currently denies any new issues or concerns. Education and supportive counseling provided throughout her hospital stay & upon discharge.   Today upon her discharge evaluation with her treatment team, Drucilla shares she is doing well. She denies any other specific concerns. She is sleeping well. Her appetite is good. She denies other physical complaints. She denies AH/VH, delusional thoughts or paranoia. She does not appear to be responding to any internal stimuli. She feels that her medications have been helpful & is in agreement to continue her current treatment regimen as recommended. She was able to engage in safety planning including plan to return to [facility] or contact emergency services if she feels unable to maintain her own safety or the safety of others. Pt had no further questions, comments, or concerns.  She left [facility] with all personal belongings in no apparent distress. Transportation per her family (mother)..  Physical Findings: AIMS:  , ,  ,  ,    CIWA:    COWS:     Musculoskeletal: Strength & Muscle Tone: within normal limits Gait & Station: normal Patient leans: N/A   Psychiatric Specialty Exam:  Presentation  General Appearance:  Appropriate for Environment; Fairly Groomed  Eye Contact: Fair  Speech: Clear and Coherent  Speech Volume: Normal  Handedness: Right   Mood and Affect  Mood: Depressed; Anxious  Affect: Appropriate  Thought Process  Thought Processes: Coherent  Descriptions of Associations:Intact  Orientation:Full (Time, Place and Person)  Thought Content:Logical  History of Schizophrenia/Schizoaffective disorder:No  Duration of Psychotic Symptoms:Less than six months  Hallucinations:Hallucinations: None  Ideas of  Reference:None  Suicidal Thoughts:Suicidal Thoughts: No  Homicidal Thoughts:Homicidal Thoughts: No  Sensorium  Memory: Immediate Good  Judgment: Good  Insight: Good  Executive Functions  Concentration: Good  Attention Span: Good  Recall: Good  Fund of Knowledge: Good  Language: Good  Psychomotor Activity  Psychomotor Activity: Psychomotor Activity: Normal  Assets  Assets: Communication Skills  Sleep  Sleep: Sleep: Good  Physical Exam: Physical Exam Vitals and nursing note reviewed.  HENT:     Nose: Nose normal.     Mouth/Throat:     Pharynx: Oropharynx is clear.  Eyes:     Pupils: Pupils are equal, round, and reactive to light.  Cardiovascular:     Rate and Rhythm: Normal rate.     Pulses: Normal pulses.  Pulmonary:     Effort: Pulmonary effort is normal.  Genitourinary:    Comments: Deferred Musculoskeletal:        General: Normal range of motion.     Cervical back: Normal range of motion.  Skin:    General: Skin is warm and dry.  Neurological:     General: No focal deficit present.     Mental Status: She is alert and oriented to person, place, and time.    Review of Systems  Constitutional:  Negative for chills, diaphoresis and fever.  HENT:  Negative for congestion and sore throat.   Eyes:  Negative for blurred vision.  Respiratory:  Negative for cough, shortness of breath and wheezing.   Cardiovascular:  Negative for chest pain and palpitations.  Gastrointestinal:  Positive for constipation (Stable on medication.). Negative for abdominal pain, diarrhea, heartburn, nausea and vomiting.  Genitourinary:  Negative for dysuria, flank pain, frequency, hematuria and urgency.  Musculoskeletal:  Negative for joint pain and myalgias.  Neurological:  Negative for dizziness, tingling, tremors, sensory change, speech change, focal weakness, seizures, loss of consciousness, weakness and headaches.  Endo/Heme/Allergies:        Allergies: NKDA   Psychiatric/Behavioral:  Positive for depression (Hx of (stable on medication).). Negative for suicidal ideas.    Blood pressure 111/67, pulse (!) 102, temperature 98.3 F (36.8 C), temperature source Oral, resp. rate 14, height 5' (1.524 m), weight 59.9 kg, last menstrual period 01/14/2023, SpO2 100%. Body mass index is 25.78 kg/m.   Social History   Tobacco Use  Smoking Status Every Day   Types: Cigarettes  Smokeless Tobacco Never   Tobacco Cessation:  N/A, patient does not currently use tobacco products  Blood Alcohol level:  Lab Results  Component Value Date   [facility] <10 01/18/2023   ETH <10 01/18/2023   Metabolic Disorder Labs:  Lab Results  Component Value Date   HGBA1C 5.8 (H) 01/20/2023  MPG 119.76 01/20/2023   No results found for: "PROLACTIN" Lab Results  Component Value Date   CHOL 108 01/20/2023   TRIG 90 01/20/2023   HDL 33 (L) 01/20/2023   CHOLHDL 3.3 01/20/2023   VLDL 18 01/20/2023   LDLCALC 57 01/20/2023   LDLCALC 89 05/03/2016    See Psychiatric Specialty Exam and Suicide Risk Assessment completed by Attending Physician prior to discharge.  Discharge destination:  Home  Is patient on multiple antipsychotic therapies at discharge:  No    Has Patient had three or more failed trials of antipsychotic monotherapy by history:  No  Recommended Plan for Multiple Antipsychotic Therapies: NA  Allergies as of 01/24/2023   No Known Allergies      Medication List     STOP taking these medications    escitalopram 10 MG tablet Commonly known as: LEXAPRO       TAKE these medications      Indication  albuterol 108 (90 Base) MCG/ACT inhaler Commonly known as: VENTOLIN HFA Inhale 2 puffs into the lungs every 4 (four) hours as needed for wheezing or shortness of breath.  Indication: Asthma   hydrOXYzine 50 MG tablet Commonly known as: ATARAX Take 1 tablet (50 mg total) by mouth 3 (three) times daily as needed (sleep or anxiety).  Indication:  Feeling Anxious, Sleep   loratadine 10 MG tablet Commonly known as: CLARITIN Take 1 tablet (10 mg total) by mouth daily. (May buy from over the counter): For allergies. Start taking on: January 25, 2023  Indication: Allergies.   polyethylene glycol 17 g packet Commonly known as: MIRALAX / GLYCOLAX Take 17 g by mouth daily as needed for moderate constipation.  Indication: Constipation   risperiDONE 2 MG tablet Commonly known as: RISPERDAL Take 1 tablet (2 mg total) by mouth at bedtime. For mood control  Indication: Mood control        Follow-up Information     [facility] Go on 01/29/2023.   Why: You have an appointment for therapy services with your therapist on 01/29/23 at 2:00 pm, in person. Contact information: [address] [place] [address] [phone]         Mozingo, Thereasa Solo, NP. Schedule an appointment as soon as possible for a visit.   Specialty: Psychiatry Why: To establish care with this provider office, please call to schedule an appointment for medication management services, and pay the $100 deposit. Contact information: [address] [place] [address] [phone]         [facility] Follow up.   Why: You may also call this provider for medication management services. Contact information: [address] [place] [address] [phone]                Follow-up recommendations: Activity:  As tolerated Diet: As recommended by your primary care doctor. Keep all scheduled follow-up appointments as recommended.  Comments: Comments: Patient is recommended to follow-up care on an outpatient basis as noted above. Prescriptions sent to pt's pharmacy of choice at discharge.   Patient agreeable to plan.   Given opportunity to ask questions.   Appears to feel comfortable with discharge denies any current suicidal or homicidal thought. Patient is also instructed  prior to discharge to: Take all medications as prescribed by his/her mental healthcare provider. Report any adverse effects and or reactions from the medicines to his/her outpatient provider promptly. Patient has been instructed & cautioned: To  not engage in alcohol and or illegal drug use while on prescription medicines. In the event of worsening symptoms, patient is instructed to call the crisis hotline, 911 and or go to the nearest ED for appropriate evaluation and treatment of symptoms. To follow-up with his/her primary care provider for your other medical issues, concerns and or health care needs.  Signed: Armandina Stammer, NP, pmhnp, fnp-bc. 01/24/2023, 11:25 AM

## 2023-01-24 NOTE — Plan of Care (Signed)
  Problem: Education: Goal: Emotional status will improve Outcome: Progressing Goal: Mental status will improve Outcome: Progressing   Problem: Activity: Goal: Interest or engagement in activities will improve Outcome: Progressing Goal: Sleeping patterns will improve Outcome: Progressing

## 2023-01-24 NOTE — Progress Notes (Signed)
   01/23/23 2108  Psych Admission Type (Psych Patients Only)  Admission Status Involuntary  Psychosocial Assessment  Patient Complaints None  Eye Contact Fair  Facial Expression Flat  Affect Appropriate to circumstance;Depressed  Speech Logical/coherent  Interaction Assertive  Motor Activity Other (Comment) (WDL)  Appearance/Hygiene Unremarkable  Behavior Characteristics Cooperative;Appropriate to situation  Mood Depressed;Pleasant  Thought Process  Coherency WDL  Content WDL  Delusions None reported or observed  Perception WDL  Hallucination None reported or observed  Judgment WDL  Confusion Moderate  Danger to Self  Current suicidal ideation? Denies  Agreement Not to Harm Self Yes  Description of Agreement verbally contracts for safety  Danger to Others  Danger to Others None reported or observed

## 2023-01-24 NOTE — [facility] Suicide Risk Assessment (Signed)
Suicide Risk Assessment  Discharge Assessment    [facility] Discharge Suicide Risk Assessment   Principal Problem: Bipolar disorder, curr episode mixed, severe, with psychotic features (HCC)  Discharge Diagnoses: Principal Problem:   Bipolar disorder, curr episode mixed, severe, with psychotic features (HCC) Active Problems:   GAD (generalized anxiety disorder)   Insomnia  Total Time spent with patient:  Greater than 30 minutes  Musculoskeletal: Strength & Muscle Tone: within normal limits Gait & Station: normal Patient leans: N/A  Psychiatric Specialty Exam  Presentation  General Appearance:  Appropriate for Environment; Fairly Groomed  Eye Contact: Fair  Speech: Clear and Coherent  Speech Volume: Normal  Handedness: Right   Mood and Affect  Mood: Depressed; Anxious  Duration of Depression Symptoms: Greater than two weeks  Affect: Appropriate   Thought Process  Thought Processes: Coherent  Descriptions of Associations:Intact  Orientation:Full (Time, Place and Person)  Thought Content:Logical  History of Schizophrenia/Schizoaffective disorder:No  Duration of Psychotic Symptoms:Less than six months  Hallucinations:Hallucinations: None  Ideas of Reference:None  Suicidal Thoughts:Suicidal Thoughts: No  Homicidal Thoughts:Homicidal Thoughts: No   Sensorium  Memory: Immediate Good  Judgment: Good  Insight: Good   Executive Functions  Concentration: Good  Attention Span: Good  Recall: Good  Fund of Knowledge: Good  Language: Good   Psychomotor Activity  Psychomotor Activity: Psychomotor Activity: Normal   Assets  Assets: Communication Skills   Sleep  Sleep: Sleep: Good   Physical Exam: See H&P. Blood pressure 111/67, pulse (!) 102, temperature 98.3 F (36.8 C), temperature source Oral, resp. rate 14, height 5' (1.524 m), weight 59.9 kg, last menstrual period 01/14/2023, SpO2 100%. Body mass index is 25.78  kg/m.  Mental Status Per Nursing Assessment::   On Admission:  Suicidal ideation indicated by others  Demographic Factors:  Adolescent or young adult  Loss Factors: Financial problems/change in socioeconomic status  Historical Factors: NA  Risk Reduction Factors:   Sense of responsibility to family, Living with another person, especially a relative, Positive social support, Positive therapeutic relationship, and Positive coping skills or problem solving skills  Continued Clinical Symptoms:  Depression:   Insomnia More than one psychiatric diagnosis Previous Psychiatric Diagnoses and Treatments  Cognitive Features That Contribute To Risk:  Closed-mindedness, Polarized thinking, and Thought constriction (tunnel vision)    Suicide Risk:  Minimal: No identifiable suicidal ideation.  Patients presenting with no risk factors but with morbid ruminations; may be classified as minimal risk based on the severity of the depressive symptoms   Follow-up Information     [facility] Go on 01/29/2023.   Why: You have an appointment for therapy services with your therapist on 01/29/23 at 2:00 pm, in person. Contact information: [address] [place] [address] [phone]         Mozingo, Thereasa Solo, NP. Schedule an appointment as soon as possible for a visit.   Specialty: Psychiatry Why: To establish care with this provider office, please call to schedule an appointment for medication management services, and pay the $100 deposit. Contact information: [address] [place] [address] [phone]         [facility] Follow up.   Why: You may also call this provider for medication management services. Contact information: [address] [place] [address] [phone]                Plan Of Care/Follow-up recommendations:  See The discharge recommendations above.  Nicole Kindred  Xzaria Teo, NP,  pmhnp, fnp-bc 01/24/2023, 11:12 AM

## 2023-02-05 ENCOUNTER — Ambulatory Visit: Payer: Self-pay

## 2023-02-05 NOTE — Telephone Encounter (Signed)
Summary: contact req / lab follow up   The patient would like to be contacted by a member of clinical staff when possible to review lab results from their emergency visit on 01/17/23  Please contact the patient further when possible     Chief Complaint: Asking to review labs. Verbalizes understanding. States she "sees specialists and will review labs with them" Symptoms: n/a Frequency: n/a Pertinent Negatives: Patient denies  Disposition: [] ED /[] Urgent Care (no appt availability in office) / [] Appointment(In office/virtual)/ []  [place] [facility]/ [x] Home Care/ [] Refused Recommended Disposition /[] [place] [facility]/ []  Follow-up with PCP Additional Notes: Will review labs with her "specialist."   Reason for Disposition  [1] Other NON-URGENT information for PCP AND [2] does not require PCP response  Answer Assessment - Initial Assessment Questions 1. REASON FOR CALL or QUESTION: "What is your reason for calling today?" or "How can I best help you?" or "What question do you have that I can help answer?"     Wanted to review labs 2. CALLER: Document the source of call. (e.g., laboratory, patient).     Patient  Protocols used: PCP Call - No Triage-A-AH

## 2023-04-06 ENCOUNTER — Ambulatory Visit (HOSPITAL_COMMUNITY)
Admission: EM | Admit: 2023-04-06 | Discharge: 2023-04-06 | Disposition: A | Discharge status: Home / Self Care | Payer: 59 | Attending: Psychiatry | Admitting: Psychiatry

## 2023-04-06 DIAGNOSIS — F319 Bipolar disorder, unspecified: Secondary | ICD-10-CM

## 2023-04-06 MED ORDER — RISPERIDONE 2 MG PO TABS: 2.0000 mg | ORAL_TABLET | Freq: Every day | ORAL | 0 refills | Status: DC

## 2023-04-06 MED ORDER — HYDROXYZINE HCL 25 MG PO TABS: 50.0000 mg | ORAL_TABLET | Freq: Every day | ORAL | Status: DC | PRN

## 2023-04-06 MED ORDER — HYDROXYZINE HCL 50 MG PO TABS: 50.0000 mg | ORAL_TABLET | Freq: Every day | ORAL | 0 refills | Status: DC | PRN

## 2023-04-06 MED ORDER — RISPERIDONE 2 MG PO TABS: 2.0000 mg | ORAL_TABLET | Freq: Every day | ORAL | Status: DC

## 2023-04-06 NOTE — ED Notes (Signed)
Pt discharged by provider.

## 2023-04-06 NOTE — Progress Notes (Signed)
   04/06/23 1310  [facility] Triage Screening (Walk-ins at [facility] only)  How Did You Hear About [place]? Family/Friend  What Is the Reason for Your Visit/Call Today? Pt presents to [facility] voluntarily and accompanied with her mother Arlo Buffone.  Pt deneis SI, HI, or AVH.  Pt needing refill on medication she received last from [facility], three weeks ago. Pt reports prior MH diagnosis or prescribed medication for symptom managment.  How Long Has This Been Causing You Problems? <Week  Have You Recently Had Any Thoughts About Hurting Yourself? No  Are You Planning to Commit Suicide/Harm Yourself At This time? No  Have you Recently Had Thoughts About Hurting Someone [name]? No  Are You Planning To Harm Someone At This Time? No  Are you currently experiencing any auditory, visual or other hallucinations? No  Have You Used Any Alcohol or Drugs in the Past 24 Hours? No  Do you have any current medical co-morbidities that require immediate attention? No  Clinician description of patient physical appearance/behavior: Casual  What Do You Feel Would Help You the Most Today? Medication(s)  If access to [facility] was not available, would you have sought care in the Emergency Department? Yes  Determination of Need Routine (7 days)  Options For Referral Medication Management

## 2023-04-06 NOTE — Discharge Instructions (Addendum)

## 2023-04-06 NOTE — ED Provider Notes (Signed)
[facility] Medical Screening Exam  Patient Name: Ashley Cole MRN: [id number] Date of Evaluation: 04/06/23 Chief Complaint: "I was on medication for bipolar 1 and I ran out" Diagnosis:  Final diagnoses:  Bipolar affective disorder, remission status unspecified (HCC)   History of Present illness: ARRIONNA SERENA is a 37 y.o. female w/ history of bipolar disorder. Per chart review, recent inpatient psychiatric admission at [facility], where she was admitted from 01/19/23-01/24/23. Discharge medications at that time included albuterol sulfate 108 (90 base) mcg/act 2 puff inhalation every 4 hours PRN, hydroxyzine hcl 50mg  oral 3 times daily PRN, loratadine 10mg  oral daily for allergies, polyethylene glycol 3350 17g oral daily PRN, risperidone 2mg  oral daily at bedtime for mood control.   On assessment today, pt is seen with her mother, Lyanna Blystone, present. Pt gives verbal consent for her mother to remain for the assessment.  Pt reports "I was on medication for bipolar 1 and I ran out". Reports following discharge from [facility] in August, she has not set up outpatient medication management services. Pt has been rationing her medication, taking them sporadically. States she is hoping for a refill today.  Pt endorses "ok" mood. Denies suicidal, homicidal ideations. Denies auditory visual hallucinations or paranoia. Reports appetite is fair and sleep is good, sleeping 8 to 9 hours/night. 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 She lives alone, although her brother has been staying with her to help monitor her. She denies use of alcohol, marijuana, crack/cocaine, methamphetamines, or other substances. Of her discharge medications, she reports she has been taking the hydroxyzine 50mg  oral daily prn for anxiety and the risperdal 2mg  at bedtime, when she is taking it.  Pt's mother is present and denies safety concerns with discharge. Main concern today is  medication bridge until pt can set up an outpatient psychiatric appointment. Expresses concerns about psychiatric decompensation without medication. States pt has been rationing her medications.   Discussed providing 30 day prescription for risperdal 2mg  oral at bedtime and hydroxyzine 50mg  oral daily as needed for anxiety. Pt and pt's mother are in agreement. They both deny safety concerns with discharge. Pt will follow up with outpatient resources. Several resources provided in pt's AVS and reviewed with her. Also discussed calling the number on the back of her insurance card for a comprehensive list of in-network providers. Questions answers to pt's mother and pt's satisfaction.  Flowsheet Row [facility] ([facility]) [facility] from 01/18/2023 in [facility] ED from 01/17/2023 in [facility] Emergency Department at [facility]  C-SSRS RISK CATEGORY No Risk Low Risk No Risk       Psychiatric Specialty Exam  Presentation  General Appearance:Appropriate for Environment; Fairly Groomed; Casual  Eye Contact:Fair  Speech:Clear and Coherent; Normal Rate  Speech Volume:Normal  Handedness:Right   Mood and Affect  Mood: -- ("ok")  Affect: Appropriate; Blunt   Thought Process  Thought Processes: Coherent; Goal Directed; Linear  Descriptions of Associations:Intact  Orientation:Full (Time, Place and Person)  Thought Content:Logical  Diagnosis of Schizophrenia or Schizoaffective disorder in past: No  Hallucinations:None  Ideas of Reference:None  Suicidal Thoughts:No  Homicidal Thoughts:No   Sensorium  Memory: Immediate Good; Recent Good; Remote Good  Judgment: Good  Insight: Good   Executive Functions  Concentration: Good  Attention Span: Good  Recall: Good  Fund of Knowledge: Good  Language: Good   Psychomotor Activity  Psychomotor  Activity: Normal   Assets  Assets: Communication Skills; Desire for Improvement; Financial Resources/Insurance; Housing; Physical Health; Resilience; Social Support; Transportation; Vocational/Educational   Sleep  Sleep: Good  Number of hours:  8 to 8 hours/night   Physical Exam: Physical Exam Constitutional:      General: She is not in acute distress.    Appearance: She is not ill-appearing, toxic-appearing or diaphoretic.  Eyes:     General: No scleral icterus. Cardiovascular:     Rate and Rhythm: Normal rate.  Pulmonary:     Effort: Pulmonary effort is normal. No respiratory distress.  Neurological:     Mental Status: She is alert and oriented to person, place, and time.  Psychiatric:        Attention and Perception: Attention and perception normal.        Mood and Affect: Mood normal. Affect is blunt.        Speech: Speech normal.        Behavior: Behavior normal. Behavior is cooperative.        Thought Content: Thought content normal.        Cognition and Memory: Cognition and memory normal.        Judgment: Judgment normal.    Review of Systems  Constitutional:  Negative for chills and fever.  Respiratory:  Negative for shortness of breath.   Cardiovascular:  Negative for chest pain and palpitations.  Gastrointestinal:  Negative for abdominal pain.  Psychiatric/Behavioral: Negative.     Blood pressure 122/74, pulse 92, temperature 98.9 F (37.2 C), temperature source Oral, resp. rate 18, SpO2 100%. There is no height or weight on file to calculate BMI.  Musculoskeletal: Strength & Muscle Tone: within normal limits Gait & Station: normal Patient leans: N/A   [facility] MSE Discharge Disposition for Follow up and Recommendations: Based on my evaluation the patient does not appear to have an emergency medical condition and can be discharged with resources and follow up care in outpatient services for Medication Management and Individual Therapy   Lauree Chandler, NP 04/06/2023, 1:45 PM

## 2023-05-28 ENCOUNTER — Emergency Department: Payer: Commercial Managed Care - PPO

## 2023-05-28 ENCOUNTER — Other Ambulatory Visit: Payer: Self-pay

## 2023-05-28 ENCOUNTER — Emergency Department
Admission: EM | Admit: 2023-05-28 | Discharge: 2023-05-28 | Discharge status: Against Medical Advice | Payer: Commercial Managed Care - PPO | Attending: Emergency Medicine | Admitting: Emergency Medicine

## 2023-05-28 DIAGNOSIS — R0600 Dyspnea, unspecified: Secondary | ICD-10-CM | POA: Diagnosis not present

## 2023-05-28 DIAGNOSIS — Z5321 Procedure and treatment not carried out due to patient leaving prior to being seen by health care provider: Secondary | ICD-10-CM | POA: Diagnosis not present

## 2023-05-28 DIAGNOSIS — R0602 Shortness of breath: Secondary | ICD-10-CM | POA: Diagnosis not present

## 2023-05-28 LAB — BASIC METABOLIC PANEL
Anion gap: 10 (ref 5–15)
BUN: 12 mg/dL (ref 6–20)
CO2: 24 mmol/L (ref 22–32)
Calcium: 9.2 mg/dL (ref 8.9–10.3)
Chloride: 104 mmol/L (ref 98–111)
Creatinine, Ser: 0.95 mg/dL (ref 0.44–1.00)
GFR, Estimated: >60 mL/min (ref >60)
Glucose, Bld: 123 mg/dL — ABNORMAL HIGH (ref 70–99)
Potassium: 3.1 mmol/L — ABNORMAL LOW (ref 3.5–5.1)
Sodium: 138 mmol/L (ref 135–145)

## 2023-05-28 LAB — CBC
HCT: 33.9 % — ABNORMAL LOW (ref 36.0–46.0)
Hemoglobin: 11.2 g/dL — ABNORMAL LOW (ref 12.0–15.0)
MCH: 29.6 pg (ref 26.0–34.0)
MCHC: 33 g/dL (ref 30.0–36.0)
MCV: 89.4 fL (ref 80.0–100.0)
Platelets: 305 10*3/uL (ref 150–400)
RBC: 3.79 MIL/uL — ABNORMAL LOW (ref 3.87–5.11)
RDW: 12.5 % (ref 11.5–15.5)
WBC: 7.2 10*3/uL (ref 4.0–10.5)
nRBC: 0 % (ref 0.0–0.2)

## 2023-05-28 LAB — TROPONIN I (HIGH SENSITIVITY): Troponin I (High Sensitivity): 3 ng/L (ref <18)

## 2023-05-28 NOTE — ED Triage Notes (Signed)
Pt sts that she has been having trouble breathing and swallowing for the last 45 min. Pt sts that her mouth is dry also. Pt is drinking a bottle of water while in triage that is 3/4 empty. Pt sts that she is able to get that down. Pt has no signs of distress at this time.

## 2023-06-10 DIAGNOSIS — F411 Generalized anxiety disorder: Secondary | ICD-10-CM | POA: Diagnosis not present

## 2023-06-10 DIAGNOSIS — F331 Major depressive disorder, recurrent, moderate: Secondary | ICD-10-CM | POA: Diagnosis not present

## 2023-06-10 DIAGNOSIS — F3132 Bipolar disorder, current episode depressed, moderate: Secondary | ICD-10-CM | POA: Diagnosis not present

## 2023-08-01 ENCOUNTER — Emergency Department
Admission: EM | Admit: 2023-08-01 | Discharge: 2023-08-01 | Discharge status: Against Medical Advice | Payer: Commercial Managed Care - PPO | Attending: Emergency Medicine | Admitting: Emergency Medicine

## 2023-08-01 ENCOUNTER — Other Ambulatory Visit: Payer: Self-pay

## 2023-08-01 ENCOUNTER — Encounter: Payer: Self-pay | Admitting: Emergency Medicine

## 2023-08-01 ENCOUNTER — Ambulatory Visit
Admission: EM | Admit: 2023-08-01 | Discharge: 2023-08-01 | Discharge status: Against Medical Advice | Payer: Commercial Managed Care - PPO

## 2023-08-01 DIAGNOSIS — R1031 Right lower quadrant pain: Secondary | ICD-10-CM | POA: Diagnosis present

## 2023-08-01 DIAGNOSIS — R509 Fever, unspecified: Secondary | ICD-10-CM | POA: Diagnosis not present

## 2023-08-01 DIAGNOSIS — Z5321 Procedure and treatment not carried out due to patient leaving prior to being seen by health care provider: Secondary | ICD-10-CM | POA: Diagnosis not present

## 2023-08-01 LAB — COMPREHENSIVE METABOLIC PANEL
ALT: 15 U/L (ref 0–44)
AST: 15 U/L (ref 15–41)
Albumin: 4.5 g/dL (ref 3.5–5.0)
Alkaline Phosphatase: 49 U/L (ref 38–126)
Anion gap: 10 (ref 5–15)
BUN: 9 mg/dL (ref 6–20)
CO2: 25 mmol/L (ref 22–32)
Calcium: 9.3 mg/dL (ref 8.9–10.3)
Chloride: 104 mmol/L (ref 98–111)
Creatinine, Ser: 0.68 mg/dL (ref 0.44–1.00)
GFR, Estimated: >60 mL/min (ref >60)
Glucose, Bld: 95 mg/dL (ref 70–99)
Potassium: 3.9 mmol/L (ref 3.5–5.1)
Sodium: 139 mmol/L (ref 135–145)
Total Bilirubin: 0.7 mg/dL (ref 0.0–1.2)
Total Protein: 7.6 g/dL (ref 6.5–8.1)

## 2023-08-01 LAB — URINALYSIS, ROUTINE W REFLEX MICROSCOPIC
Bilirubin Urine: NEGATIVE
Glucose, UA: NEGATIVE mg/dL
Ketones, ur: NEGATIVE mg/dL
Leukocytes,Ua: NEGATIVE
Nitrite: NEGATIVE
Protein, ur: NEGATIVE mg/dL
Specific Gravity, Urine: 1.015 (ref 1.005–1.030)
pH: 8 (ref 5.0–8.0)

## 2023-08-01 LAB — CBC
HCT: 33.3 % — ABNORMAL LOW (ref 36.0–46.0)
Hemoglobin: 11 g/dL — ABNORMAL LOW (ref 12.0–15.0)
MCH: 29.8 pg (ref 26.0–34.0)
MCHC: 33 g/dL (ref 30.0–36.0)
MCV: 90.2 fL (ref 80.0–100.0)
Platelets: 294 10*3/uL (ref 150–400)
RBC: 3.69 MIL/uL — ABNORMAL LOW (ref 3.87–5.11)
RDW: 13.1 % (ref 11.5–15.5)
WBC: 7.6 10*3/uL (ref 4.0–10.5)
nRBC: 0 % (ref 0.0–0.2)

## 2023-08-01 LAB — RESP PANEL BY RT-PCR (RSV, FLU A&B, COVID)  RVPGX2
Influenza A by PCR: NEGATIVE
Influenza B by PCR: NEGATIVE
Resp Syncytial Virus by PCR: NEGATIVE
SARS Coronavirus 2 by RT PCR: NEGATIVE

## 2023-08-01 LAB — LIPASE, BLOOD: Lipase: 33 U/L (ref 11–51)

## 2023-08-01 NOTE — ED Triage Notes (Signed)
 Patient to ED via POV for fever, N/V, abd pain- RLQ. Fever at home- 99.2. Symptoms ongoing x3 days. States negative pregnancy test at home.

## 2023-08-01 NOTE — ED Provider Triage Note (Signed)
 Emergency Medicine Provider Triage Evaluation Note  Ashley Cole , a 38 y.o. female  was evaluated in triage.  Pt complains of few days of RLQ pain temperature elevated at home.  No cough or congestion.  Review of Systems  Positive:  Negative:   Physical Exam  Ht 5' (1.524 m)   Wt 63.5 kg   BMI 27.34 kg/m  Gen:   Awake, no distress   Resp:  Normal effort  MSK:   Moves extremities without difficulty  Other:    Medical Decision Making  Medically screening exam initiated at 6:18 PM.  Appropriate orders placed.  Ashley Cole was informed that the remainder of the evaluation will be completed by another provider, this initial triage assessment does not replace that evaluation, and the importance of remaining in the ED until their evaluation is complete.  Abdominal workup   Ashley Cole, [place] 08/01/23 1610

## 2023-08-01 NOTE — ED Triage Notes (Signed)
 Patient states 3 days of urinary frequency, lower abdominal pain mostly right sided, chills, nausea and vomiting .  Concerned that she is having milk from breast but has had negative pregnancy tests and no recent child birth.

## 2023-08-02 LAB — PREGNANCY, URINE: Preg Test, Ur: NEGATIVE

## 2023-08-28 ENCOUNTER — Other Ambulatory Visit: Payer: Self-pay

## 2023-08-28 DIAGNOSIS — N6452 Nipple discharge: Secondary | ICD-10-CM

## 2023-08-29 ENCOUNTER — Other Ambulatory Visit: Payer: Self-pay

## 2023-08-29 ENCOUNTER — Encounter (HOSPITAL_BASED_OUTPATIENT_CLINIC_OR_DEPARTMENT_OTHER): Payer: Self-pay

## 2023-08-29 ENCOUNTER — Emergency Department (HOSPITAL_BASED_OUTPATIENT_CLINIC_OR_DEPARTMENT_OTHER)
Admission: EM | Admit: 2023-08-29 | Discharge: 2023-08-29 | Disposition: A | Discharge status: Home / Self Care | Payer: Self-pay | Attending: Emergency Medicine | Admitting: Emergency Medicine

## 2023-08-29 DIAGNOSIS — N760 Acute vaginitis: Secondary | ICD-10-CM | POA: Insufficient documentation

## 2023-08-29 DIAGNOSIS — B9689 Other specified bacterial agents as the cause of diseases classified elsewhere: Secondary | ICD-10-CM

## 2023-08-29 LAB — URINALYSIS, MICROSCOPIC (REFLEX)

## 2023-08-29 LAB — URINALYSIS, ROUTINE W REFLEX MICROSCOPIC
Bilirubin Urine: NEGATIVE
Glucose, UA: NEGATIVE mg/dL
Ketones, ur: 15 mg/dL — AB
Leukocytes,Ua: NEGATIVE
Nitrite: NEGATIVE
Protein, ur: NEGATIVE mg/dL
Specific Gravity, Urine: 1.025 (ref 1.005–1.030)
pH: 6 (ref 5.0–8.0)

## 2023-08-29 LAB — WET PREP, GENITAL
Sperm: NONE SEEN
Trich, Wet Prep: NONE SEEN
WBC, Wet Prep HPF POC: <10 (ref <10)
Yeast Wet Prep HPF POC: NONE SEEN

## 2023-08-29 LAB — GC/CHLAMYDIA PROBE AMP (~~LOC~~) NOT AT ARMC
Chlamydia: NEGATIVE
Comment: NEGATIVE
Comment: NORMAL
Neisseria Gonorrhea: NEGATIVE

## 2023-08-29 LAB — PREGNANCY, URINE: Preg Test, Ur: NEGATIVE

## 2023-08-29 LAB — HIV ANTIBODY (ROUTINE TESTING W REFLEX): HIV Screen 4th Generation wRfx: NONREACTIVE

## 2023-08-29 MED ORDER — FLUCONAZOLE 150 MG PO TABS: 150.0000 mg | ORAL_TABLET | Freq: Once | ORAL | 0 refills | Status: AC

## 2023-08-29 MED ORDER — METRONIDAZOLE 500 MG PO TABS: 500.0000 mg | ORAL_TABLET | Freq: Two times a day (BID) | ORAL | 0 refills | Status: DC

## 2023-08-29 NOTE — ED Provider Notes (Signed)
 [facility] Provider Note   CSN: [id number] Arrival date & time: 08/29/23  1057     History  Chief Complaint  Patient presents with   Vaginal Itching    Ashley Cole is a 38 y.o. female.  HPI   38 year old female presents emergency department with a week of vaginal itching, white to discharge with odor.  Patient admits that she recently found out that her husband has been unfaithful and she has concern for STD.  She is also requesting GC/chlamydia and HIV testing.  Otherwise she denies any fever or systemic symptoms.  No vaginal bleeding or pelvic pain.  Home Medications Prior to Admission medications   Medication Sig Start Date End Date Taking? Authorizing Provider  fluconazole (DIFLUCAN) 150 MG tablet Take 1 tablet (150 mg total) by mouth once for 1 dose. 08/29/23 08/29/23 Yes Rhylee Pucillo, Clabe Seal, DO  metroNIDAZOLE (FLAGYL) 500 MG tablet Take 1 tablet (500 mg total) by mouth 2 (two) times daily. 08/29/23  Yes Hodari Chuba, Clabe Seal, DO  albuterol (VENTOLIN HFA) 108 (90 Base) MCG/ACT inhaler Inhale 2 puffs into the lungs every 4 (four) hours as needed for wheezing or shortness of breath. 01/24/23   Armandina Stammer I, NP  hydrOXYzine (ATARAX) 50 MG tablet Take 1 tablet (50 mg total) by mouth daily as needed for anxiety. 04/06/23   Lauree Chandler, NP  loratadine (CLARITIN) 10 MG tablet Take 1 tablet (10 mg total) by mouth daily. (May buy from over the counter): For allergies. 01/25/23   Armandina Stammer I, NP  polyethylene glycol (MIRALAX / GLYCOLAX) 17 g packet Take 17 g by mouth daily as needed for moderate constipation. 01/24/23   Armandina Stammer I, NP  risperiDONE (RISPERDAL) 2 MG tablet Take 1 tablet (2 mg total) by mouth at bedtime. 04/06/23 05/06/23  Lauree Chandler, NP  FLUoxetine (PROZAC) 20 MG capsule  09/08/18 03/06/20  [provider]  fluticasone (FLONASE) 50 MCG/ACT nasal spray Place into both nostrils daily. Patient not taking:  Reported on 12/17/2019  03/06/20  [provider]  montelukast (SINGULAIR) 10 MG tablet Take 10 mg by mouth at bedtime. Patient not taking: Reported on 12/17/2019  03/06/20  [provider]      Allergies    Patient has no known allergies.    Review of Systems   Review of Systems  Constitutional:  Negative for fever.  Respiratory:  Negative for shortness of breath.   Cardiovascular:  Negative for chest pain.  Gastrointestinal:  Negative for abdominal pain, diarrhea and vomiting.  Genitourinary:  Positive for vaginal discharge. Negative for pelvic pain, vaginal bleeding and vaginal pain.  Skin:  Negative for rash.  Neurological:  Negative for headaches.    Physical Exam Updated Vital Signs BP (!) 143/74 (BP Location: Left Arm)   Temp 98.2 F (36.8 C) (Oral)   Resp 18   Ht 5' (1.524 m)   Wt 63.5 kg   LMP 08/02/2023   SpO2 99%   BMI 27.34 kg/m  Physical Exam Vitals and nursing note reviewed.  Constitutional:      Appearance: Normal appearance.  HENT:     Head: Normocephalic.     Mouth/Throat:     Mouth: Mucous membranes are moist.  Cardiovascular:     Rate and Rhythm: Normal rate.  Pulmonary:     Effort: Pulmonary effort is normal. No respiratory distress.  Abdominal:     Palpations: Abdomen is soft.     Tenderness:  There is no abdominal tenderness.  Genitourinary:    Comments: Chaperoned by RN, whitish discharge with a slight foul odor, no bleeding, no CMT Skin:    General: Skin is warm.  Neurological:     Mental Status: She is alert and oriented to person, place, and time. Mental status is at baseline.  Psychiatric:        Mood and Affect: Mood normal.     ED Results / Procedures / Treatments   Labs (all labs ordered are listed, but only abnormal results are displayed) Labs Reviewed  WET PREP, GENITAL - Abnormal; Notable for the following components:      Result Value   Clue Cells Wet Prep HPF POC PRESENT (*)    All other components within  normal limits  URINALYSIS, ROUTINE W REFLEX MICROSCOPIC - Abnormal; Notable for the following components:   Hgb urine dipstick LARGE (*)    Ketones, ur 15 (*)    All other components within normal limits  URINALYSIS, MICROSCOPIC (REFLEX) - Abnormal; Notable for the following components:   Bacteria, UA FEW (*)    All other components within normal limits  PREGNANCY, URINE  HIV ANTIBODY (ROUTINE TESTING W REFLEX)  GC/CHLAMYDIA PROBE AMP ([place]) NOT AT [facility]    EKG None  Radiology No results found.  Procedures Procedures    Medications Ordered in ED Medications - No data to display  ED Course/ Medical Decision Making/ A&P                                 Medical Decision Making Amount and/or Complexity of Data Reviewed Labs: ordered.  Risk Prescription drug management.   38 year old female presents emergency department with vaginal discharge and odor, requesting STD testing.  Vitals are normal and stable.  Pelvic shows whitish discharge with a slight foul odor.  Wet prep is significant for clue cells.  Will treat for bacterial vaginosis.  GC chlamydia cultures have been sent, patient wants to hold on treatment till these results.  She is also requesting HIV testing that has been sent.  Patient at this time appears safe and stable for discharge and close outpatient follow up. Discharge plan and strict return to ED precautions discussed, patient verbalizes understanding and agreement.        Final Clinical Impression(s) / ED Diagnoses Final diagnoses:  Bacterial vaginosis    Rx / DC Orders ED Discharge Orders          Ordered    metroNIDAZOLE (FLAGYL) 500 MG tablet  2 times daily        08/29/23 1512    fluconazole (DIFLUCAN) 150 MG tablet   Once        08/29/23 1512              Tametra Ahart, Clabe Seal, DO 08/29/23 1514

## 2023-08-29 NOTE — Discharge Instructions (Signed)
 You have been seen and discharged from the emergency department.  Your evaluation is significant for bacterial vaginosis.  Cultures for GC/chlamydia and testing for HIV has been sent.  You have been placed on an antibiotic for the bacterial infection.  Take as directed, avoid alcohol use with this antibiotic as it can cause a violent vomiting reaction.  You have also been prescribed 1 dose of Diflucan if this results in a yeast infection.  You will be contacted if the GC/chlamydia cultures come back positive.  Follow-up with your primary provider for further evaluation and further care. Take home medications as prescribed. If you have any worsening symptoms or further concerns for your health please return to an emergency department for further evaluation.

## 2023-08-29 NOTE — ED Triage Notes (Addendum)
 Pt states that she noted a vaginal odor and itching x 1 week ago. States that she also is having to go more often to pee. States that she wants to be tested for STD's. States that she is also having some discharge.

## 2023-10-11 ENCOUNTER — Encounter (HOSPITAL_COMMUNITY): Payer: Self-pay

## 2023-10-17 ENCOUNTER — Ambulatory Visit: Payer: Self-pay | Admitting: *Deleted

## 2023-10-17 VITALS — BP 111/72 | Wt 138.0 lb

## 2023-10-17 DIAGNOSIS — Z01419 Encounter for gynecological examination (general) (routine) without abnormal findings: Secondary | ICD-10-CM

## 2023-10-17 NOTE — Patient Instructions (Signed)
 Explained breast self awareness with [name]. Pap smear completed today. Let her know B[facility]CP will cover Pap smears and HPV Typing every 5 years unless has a history of abnormal Pap smears. Referred patient to the [facility] for a diagnostic mammogram. Appointment scheduled Friday, Oct 18, 2023 at 0820. Patient aware of appointment and will be there. Let patient know will follow up with her within the next couple weeks with results of Pap smear by letter or phone. [name] verbalized understanding.  Ashley Cole, Ashley Favor, RN 1:57 PM

## 2023-10-17 NOTE — Progress Notes (Signed)
 [name] is a 38 y.o. [id number] female who presents to [facility] clinic today with complaint of bilateral milky breast discharge when expressed x 4-5 months.    Pap Smear: Pap smear completed today. Last Pap smear was 08/16/2016 at Dr. Eustaquio Hight clinic and was normal with negative HPV. Per patient has history of an abnormal Pap smear 12 years ago that cryotherapy was completed and 17 years ago that a colposcopy was completed for follow up. Last Pap smear result is available in Epic.   Physical exam: Breasts Right breast slightly larger than left breast that per patient is normal for her.. No skin abnormalities bilateral breasts. No nipple retraction bilateral breasts. No nipple discharge bilateral breasts. Unable to express any nipple discharge on exam. Patient stated she was taking Risperdal  and stopped taking the medication and the breast discharge resolved. Breast milk production is noted as a side effect of Risperdal . No lymphadenopathy. No lumps palpated bilateral breasts. No complaints of pain or tenderness on exam.       Pelvic/Bimanual Ext Genitalia No lesions, no swelling and no discharge observed on external genitalia.        Vagina Vagina pink and normal texture. No lesions or discharge observed in vagina.        Cervix Cervix is present. Cervix pink and of normal texture. No discharge observed.    Uterus Uterus is present and palpable. Uterus in normal position and normal size.        Adnexae Bilateral ovaries present and palpable. No tenderness on palpation.         Rectovaginal No rectal exam completed today since patient had no rectal complaints. No skin abnormalities observed on exam.     Smoking History: Patient is a current smoker. Referred to the [facility] Quitline.  Patient Navigation: Patient education provided. Access to services provided for patient through BCCCP program.    Breast and Cervical Cancer Risk Assessment: Patient does not have family history of breast  cancer, known genetic mutations, or radiation treatment to the chest before age 67. Patient does not have history of cervical dysplasia, immunocompromised, or DES exposure in-utero.  Risk Scores as of Encounter on 10/17/2023     Gregary Lean           5-year 0.47%   Lifetime 9.72%            Last calculated by Silas, Ansyi K, CMA on 10/17/2023 at  1:37 PM        A: BCCCP exam with pap smear Complaint of bilateral breast discharge.  P: Referred patient to the [facility] for a diagnostic mammogram. Appointment scheduled Friday, Oct 18, 2023 at 0820.  Stefan Edge, RN 10/17/2023 1:56 PM

## 2023-10-18 ENCOUNTER — Ambulatory Visit
Admission: RE | Admit: 2023-10-18 | Discharge: 2023-10-18 | Disposition: A | Discharge status: Home / Self Care | Source: Ambulatory Visit | Attending: Obstetrics and Gynecology | Admitting: Obstetrics and Gynecology

## 2023-10-18 DIAGNOSIS — N6452 Nipple discharge: Secondary | ICD-10-CM

## 2023-10-21 ENCOUNTER — Ambulatory Visit: Payer: Self-pay | Admitting: Obstetrics & Gynecology

## 2023-10-21 ENCOUNTER — Ambulatory Visit: Payer: Self-pay

## 2023-10-21 LAB — CYTOLOGY - PAP
Comment: NEGATIVE
Diagnosis: NEGATIVE
High risk HPV: NEGATIVE
